# Patient Record
Sex: Female | Born: 1945 | Race: White | Hispanic: No | Smoking: Never smoker
Health system: Southern US, Community
[De-identification: ages and names within clinical notes are randomized; demographics above are authoritative.]

## PROBLEM LIST (undated history)

## (undated) DIAGNOSIS — G43909 Migraine, unspecified, not intractable, without status migrainosus: Secondary | ICD-10-CM

## (undated) DIAGNOSIS — J45990 Exercise induced bronchospasm: Secondary | ICD-10-CM

## (undated) DIAGNOSIS — G9050 Complex regional pain syndrome I, unspecified: Secondary | ICD-10-CM

## (undated) DIAGNOSIS — K219 Gastro-esophageal reflux disease without esophagitis: Secondary | ICD-10-CM

## (undated) DIAGNOSIS — Z8489 Family history of other specified conditions: Secondary | ICD-10-CM

## (undated) DIAGNOSIS — R112 Nausea with vomiting, unspecified: Secondary | ICD-10-CM

## (undated) DIAGNOSIS — I10 Essential (primary) hypertension: Secondary | ICD-10-CM

## (undated) DIAGNOSIS — K5900 Constipation, unspecified: Secondary | ICD-10-CM

## (undated) DIAGNOSIS — M1990 Unspecified osteoarthritis, unspecified site: Secondary | ICD-10-CM

## (undated) DIAGNOSIS — I214 Non-ST elevation (NSTEMI) myocardial infarction: Secondary | ICD-10-CM

## (undated) DIAGNOSIS — E7800 Pure hypercholesterolemia, unspecified: Secondary | ICD-10-CM

## (undated) DIAGNOSIS — J189 Pneumonia, unspecified organism: Secondary | ICD-10-CM

## (undated) DIAGNOSIS — R011 Cardiac murmur, unspecified: Secondary | ICD-10-CM

## (undated) DIAGNOSIS — Z9889 Other specified postprocedural states: Secondary | ICD-10-CM

## (undated) DIAGNOSIS — M726 Necrotizing fasciitis: Secondary | ICD-10-CM

## (undated) DIAGNOSIS — I2510 Atherosclerotic heart disease of native coronary artery without angina pectoris: Secondary | ICD-10-CM

## (undated) HISTORY — PX: TONSILLECTOMY: SUR1361

## (undated) HISTORY — PX: BACK SURGERY: SHX140

## (undated) HISTORY — PX: CORONARY ANGIOPLASTY WITH STENT PLACEMENT: SHX49

---

## 2001-11-28 ENCOUNTER — Encounter: Admission: RE | Admit: 2001-11-28 | Discharge: 2001-11-28 | Payer: Self-pay | Admitting: Internal Medicine

## 2001-11-28 ENCOUNTER — Encounter: Payer: Self-pay | Admitting: Internal Medicine

## 2004-02-04 ENCOUNTER — Ambulatory Visit (HOSPITAL_COMMUNITY): Admission: RE | Admit: 2004-02-04 | Discharge: 2004-02-04 | Payer: Self-pay | Admitting: Internal Medicine

## 2005-02-11 ENCOUNTER — Ambulatory Visit (HOSPITAL_COMMUNITY): Admission: RE | Admit: 2005-02-11 | Discharge: 2005-02-11 | Payer: Self-pay | Admitting: Internal Medicine

## 2005-02-11 ENCOUNTER — Other Ambulatory Visit: Admission: RE | Admit: 2005-02-11 | Discharge: 2005-02-11 | Payer: Self-pay | Admitting: Internal Medicine

## 2006-03-12 ENCOUNTER — Emergency Department (HOSPITAL_COMMUNITY): Admission: EM | Admit: 2006-03-12 | Discharge: 2006-03-12 | Payer: Self-pay | Admitting: Family Medicine

## 2006-07-18 HISTORY — PX: FINGER AMPUTATION: SHX636

## 2006-09-17 ENCOUNTER — Inpatient Hospital Stay (HOSPITAL_COMMUNITY): Admission: EM | Admit: 2006-09-17 | Discharge: 2006-09-20 | Payer: Self-pay | Admitting: Emergency Medicine

## 2006-10-05 ENCOUNTER — Ambulatory Visit (HOSPITAL_BASED_OUTPATIENT_CLINIC_OR_DEPARTMENT_OTHER): Admission: RE | Admit: 2006-10-05 | Discharge: 2006-10-05 | Payer: Self-pay | Admitting: *Deleted

## 2006-10-05 ENCOUNTER — Encounter (INDEPENDENT_AMBULATORY_CARE_PROVIDER_SITE_OTHER): Payer: Self-pay | Admitting: *Deleted

## 2008-02-14 ENCOUNTER — Ambulatory Visit (HOSPITAL_COMMUNITY): Admission: RE | Admit: 2008-02-14 | Discharge: 2008-02-14 | Payer: Self-pay | Admitting: Family Medicine

## 2008-03-19 DIAGNOSIS — M1990 Unspecified osteoarthritis, unspecified site: Secondary | ICD-10-CM | POA: Insufficient documentation

## 2009-06-05 ENCOUNTER — Encounter (INDEPENDENT_AMBULATORY_CARE_PROVIDER_SITE_OTHER): Payer: Self-pay | Admitting: *Deleted

## 2009-07-24 DIAGNOSIS — G44209 Tension-type headache, unspecified, not intractable: Secondary | ICD-10-CM | POA: Insufficient documentation

## 2009-07-24 DIAGNOSIS — I10 Essential (primary) hypertension: Secondary | ICD-10-CM | POA: Insufficient documentation

## 2009-07-24 DIAGNOSIS — K219 Gastro-esophageal reflux disease without esophagitis: Secondary | ICD-10-CM | POA: Insufficient documentation

## 2010-01-07 ENCOUNTER — Encounter (INDEPENDENT_AMBULATORY_CARE_PROVIDER_SITE_OTHER): Payer: Self-pay | Admitting: *Deleted

## 2010-01-13 ENCOUNTER — Encounter (INDEPENDENT_AMBULATORY_CARE_PROVIDER_SITE_OTHER): Payer: Self-pay | Admitting: *Deleted

## 2010-01-14 ENCOUNTER — Ambulatory Visit: Payer: Self-pay | Admitting: Gastroenterology

## 2010-02-15 ENCOUNTER — Telehealth: Payer: Self-pay | Admitting: Gastroenterology

## 2010-02-16 ENCOUNTER — Ambulatory Visit: Payer: Self-pay | Admitting: Gastroenterology

## 2010-08-17 NOTE — Progress Notes (Signed)
Summary: prep ?  Phone Note Call from Patient Call back at [facility] Phone [phone]   Caller: Patient Call For: Dr. Russella Dar Reason for Call: Talk to Nurse Summary of Call: prep ? Initial call taken by: Vallarie Mare,  February 15, 2010 9:27 AM  Follow-up for Phone Call        Pt. needs to start prep 2 hours later due to work. Told pt. this was fine. Follow-up by: Durwin Glaze RN,  February 15, 2010 10:07 AM

## 2010-08-17 NOTE — Procedures (Signed)
Summary: Colonoscopy  Patient: [name] [name] Note: All result statuses are Final unless otherwise noted.  Tests: (1) Colonoscopy (COL)   COL Colonoscopy           DONE     [facility]     [address]     [place], [place]  [address]           COLONOSCOPY PROCEDURE REPORT           PATIENT:  [name], [name]  MR#:  [id number]     BIRTHDATE:  [birth year], 64 yrs. old  GENDER:  female     ENDOSCOPIST:  Judie Petit T. Russella Dar, MD, [facility]     Referred by:  Tracey Harries, M.D.     PROCEDURE DATE:  02/16/2010     PROCEDURE:  Colonoscopy 562[address]     ASA CLASS:  Class II     INDICATIONS:  1) Routine Risk Screening     MEDICATIONS:   Fentanyl 50 mcg IV, Versed 7 mg IV     DESCRIPTION OF PROCEDURE:   After the risks benefits and     alternatives of the procedure were thoroughly explained, informed     consent was obtained.  Digital rectal exam was performed and     revealed no abnormalities.   The LB PCF-H180AL [id number] endoscope     was introduced through the anus and advanced to the cecum, which     was identified by both the appendix and ileocecal valve, without     limitations.  The quality of the prep was excellent, using     MoviPrep.  The instrument was then slowly withdrawn as the colon     was fully examined.     <<PROCEDUREIMAGES>>     FINDINGS:  Mild diverticulosis was found in the sigmoid colon. A     normal appearing cecum, ileocecal valve, and appendiceal orifice     were identified. The ascending, hepatic flexure, transverse,     splenic flexure, descending colon, and rectum appeared     unremarkable. Retroflexed views in the rectum revealed internal     hemorrhoids, small. The time to cecum =  2.25  minutes. The scope     was then withdrawn (time =  9.5  min) from the patient and the     procedure completed.           COMPLICATIONS:  None           ENDOSCOPIC IMPRESSION:     1) Mild diverticulosis in the sigmoid colon     2) Internal hemorrhoids        RECOMMENDATIONS:     1) High fiber diet with liberal fluid intake.     2) Continue current colorectal screening recommendations for     "routine risk" patients with a repeat colonoscopy in 10 years.           Venita Lick. Russella Dar, MD, [facility]           n.     eSIGNED:   Venita Lick. Stark at 02/16/2010 12:11 PM           Georgeann Oppenheim, [id number]  Note: An exclamation mark (!) indicates a result that was not dispersed into the flowsheet. Document Creation Date: 02/16/2010 12:12 PM _______________________________________________________________________  (1) Order result status: Final Collection or observation date-time: 02/16/2010 12:07 Requested date-time:  Receipt date-time:  Reported date-time:  Referring Physician:   Ordering Physician: Claudette Head [phone]) Specimen Source:  Source: Launa Grill  Order Number: [id number] Lab site:   Appended Document: Colonoscopy    Clinical Lists Changes  Observations: Added new observation of COLONNXTDUE: 02/2020 (02/16/2010 13:33)

## 2010-08-17 NOTE — Letter (Signed)
Summary: Moviprep Instructions  [facility]  [address]   [place], [place] [address]   Phone: [phone]  Fax: [phone]       [name] [name]    06/16/1946    MRN: [id number]        Procedure Day /Date: Tuesday, 02-16-10     Arrival Time: 10:30 a.m.      Procedure Time: 11:30 a.m.     Location of Procedure:                    x   [facility] (4th Floor)                        PREPARATION FOR COLONOSCOPY WITH MOVIPREP   Starting 5 days prior to your procedure  02-11-10 do not eat nuts, seeds, popcorn, corn, beans, peas,  salads, or any raw vegetables.  Do not take any fiber supplements (e.g. Metamucil, Citrucel, and Benefiber).  THE DAY BEFORE YOUR PROCEDURE         DATE: 02-15-10   DAY: Monday  1.  Drink clear liquids the entire day-NO SOLID FOOD  2.  Do not drink anything colored red or purple.  Avoid juices with pulp.  No orange juice.  3.  Drink at least 64 oz. (8 glasses) of fluid/clear liquids during the day to prevent dehydration and help the prep work efficiently.  CLEAR LIQUIDS INCLUDE: Water Jello Ice Popsicles Tea (sugar ok, no milk/cream) Powdered fruit flavored drinks Coffee (sugar ok, no milk/cream) Gatorade Juice: apple, white grape, white cranberry  Lemonade Clear bullion, consomm, broth Carbonated beverages (any kind) Strained chicken noodle soup Hard Candy                             4.  In the morning, mix first dose of MoviPrep solution:    Empty 1 Pouch A and 1 Pouch B into the disposable container    Add lukewarm drinking water to the top line of the container. Mix to dissolve    Refrigerate (mixed solution should be used within 24 hrs)  5.  Begin drinking the prep at 5:00 p.m. The MoviPrep container is divided by 4 marks.   Every 15 minutes drink the solution down to the next mark (approximately 8 oz) until the full liter is complete.   6.  Follow completed prep with 16 oz of clear liquid of your choice  (Nothing red or purple).  Continue to drink clear liquids until bedtime.  7.  Before going to bed, mix second dose of MoviPrep solution:    Empty 1 Pouch A and 1 Pouch B into the disposable container    Add lukewarm drinking water to the top line of the container. Mix to dissolve    Refrigerate  THE DAY OF YOUR PROCEDURE      DATE: 02-16-10  DAY: Tuesday  Beginning at 6:30 a.m. (5 hours before procedure):         1. Every 15 minutes, drink the solution down to the next mark (approx 8 oz) until the full liter is complete.  2. Follow completed prep with 16 oz. of clear liquid of your choice.    3. You may drink clear liquids until 9:30 a.m.  (2 HOURS BEFORE PROCEDURE).   MEDICATION INSTRUCTIONS  Unless otherwise instructed, you should take regular prescription medications with a small sip of water   as early  as possible the morning of your procedure.    Additional medication instructions:  Do not take HCTZ day of procedure.         OTHER INSTRUCTIONS  You will need a responsible adult at least 65 years of age to accompany you and drive you home.   This person must remain in the waiting room during your procedure.  Wear loose fitting clothing that is easily removed.  Leave jewelry and other valuables at home.  However, you may wish to bring a book to read or  an iPod/MP3 player to listen to music as you wait for your procedure to start.  Remove all body piercing jewelry and leave at home.  Total time from sign-in until discharge is approximately 2-3 hours.  You should go home directly after your procedure and rest.  You can resume normal activities the  day after your procedure.  The day of your procedure you should not:   Drive   Make legal decisions   Operate machinery   Drink alcohol   Return to work  You will receive specific instructions about eating, activities and medications before you leave.    The above instructions have been reviewed and  explained to me by   Ezra Sites RN  January 14, 2010 5:05 PM     I fully understand and can verbalize these instructions _____________________________ Date _________

## 2010-08-17 NOTE — Letter (Signed)
Summary: Previsit letter  [facility]  [address] [place], [place] [address]   Phone: [phone]  Fax: [phone]       01/07/2010 MRN: [id number]  [name] [name] [address] [place], [place]  [address]  Dear Ms. Califf,  Welcome to the Gastroenterology Division at [facility].    You are scheduled to see a nurse for your pre-procedure visit on 01-14-10 at 4:00p.m. on the 3rd floor at [facility], [address]  We ask that you try to arrive at our office 15 minutes prior to your appointment time to allow for check-in.  Your nurse visit will consist of discussing your medical and surgical history, your immediate family medical history, and your medications.    Please bring a complete list of all your medications or, if you prefer, bring the medication bottles and we will list them.  We will need to be aware of both prescribed and over the counter drugs.  We will need to know exact dosage information as well.  If you are on blood thinners (Coumadin, Plavix, Aggrenox, Ticlid, etc.) please call our office today/prior to your appointment, as we need to consult with your physician about holding your medication.   Please be prepared to read and sign documents such as consent forms, a financial agreement, and acknowledgement forms.  If necessary, and with your consent, a friend or relative is welcome to sit-in on the nurse visit with you.  Please bring your insurance card so that we may make a copy of it.  If your insurance requires a referral to see a specialist, please bring your referral form from your primary care physician.  No co-pay is required for this nurse visit.     If you cannot keep your appointment, please call [phone] to cancel or reschedule prior to your appointment date.  This allows [place] the opportunity to schedule an appointment for another patient in need of care.    Thank you for choosing [facility] for your medical  needs.  We appreciate the opportunity to care for you.  Please visit [place] at our website  to learn more about our practice.                     Sincerely.                                                                                                                   The Gastroenterology Division

## 2010-08-17 NOTE — Miscellaneous (Signed)
Summary: [facility] PV  Clinical Lists Changes  Medications: Added new medication of MOVIPREP 100 GM  SOLR (PEG-KCL-NACL-NASULF-NA ASC-C) As per prep instructions. - Signed Rx of MOVIPREP 100 GM  SOLR (PEG-KCL-NACL-NASULF-NA ASC-C) As per prep instructions.;  #1 x 0;  Signed;  Entered by: Ezra Sites RN;  Authorized by: Meryl Dare MD [facility];  Method used: Electronically to [facility] #[id number]*, [address], [place], [place]  [address], Ph: [phone], Fax: [phone] Observations: Added new observation of ALLERGY REV: Done (01/14/2010 16:19)    Prescriptions: MOVIPREP 100 GM  SOLR (PEG-KCL-NACL-NASULF-NA ASC-C) As per prep instructions.  #1 x 0   Entered by:   Ezra Sites RN   Authorized by:   Meryl Dare MD [facility]   Signed by:   Ezra Sites RN on 01/14/2010   Method used:   Electronically to        [id number] (retail)       [address] [place], [place]  [address]       Ph: [phone]       Fax: [phone]   RxID:   [id number]

## 2010-12-03 NOTE — H&P (Signed)
[name] [name], MEHLMAN NO.:  [id number]   MEDICAL RECORD NO.:  [id number]          PATIENT TYPE:  INP   LOCATION:  1511                         FACILITY:  [facility]   PHYSICIAN:  Lowell Bouton, M.D.DATE OF BIRTH:  [birth year]   DATE OF ADMISSION:  09/17/2006  DATE OF DISCHARGE:                              HISTORY & PHYSICAL   CHIEF COMPLAINT:  Pain and swelling right index finger.   HISTORY OF PRESENT ILLNESS:  The patient is a 65 year old right-handed  female who woke up this morning with pain and swelling in her right  index finger.  It started around the DIP joint and she has a history of  osteoarthritis there.  She did not notice any type of cuts or puncture  wounds.  The distal phalanx then swelled and her finger became red back  to the PIP joint.  She then noticed that her pulp was losing circulation  and that she was getting streaking up her forearm.  She presented to the  emergency around 3:00 a.m.   PAST MEDICAL HISTORY:  Remarkable for wisdom teeth extraction and  childbirth.   CURRENT MEDICATIONS:  Mobic and Actonel.   ALLERGIES:  She is allergic to CODEINE which causes her face to swell  and she becomes short of breath.   FAMILY HISTORY:  Positive for hypertension, negative for diabetes and  heart disease.   SOCIAL HISTORY:  She is married, does not smoke or drink and runs a day  care.   REVIEW OF SYSTEMS:  Positive for osteoporosis, negative for diabetes,  hypertension and heart disease.   PHYSICAL EXAM:  GENERAL:  She is a well-developed, well-nourished female  in obvious distress. Temperature was 103 earlier in the day.  HEENT:  Her pupils are equal, round, and reactive to light.  Nasal  septum is midline.  Oropharynx is clear.  External auditory canals are  clear.  NECK:  No adenopathy or bruit.  CHEST:  Clear.  HEART:  Regular without murmur.  ABDOMEN:  Soft, nontender without masses.  EXTREMITY:  Exam reveals her right upper  extremity to have some  streaking in the forearm and in the upper arm with some tenderness in  the axilla.  The right index finger reveals tenseness of the pulp with  erythema back to the PIP joint and tenderness along the flexor sheath.  There is a dusky appearance to the pulp tissue.  X-rays of the right  index finger show significant osteoarthritis but no sign of infection.   DIAGNOSIS:  Probable felon with extension into the flexor tendon sheath,  right index finger.   PLAN:  Will admit her for IV vancomycin until the morning and then  perform incision and drainage of her right index finger.  She will  require hospitalization for 1 or 2 days following the procedure for IV  antibiotics.      Lowell Bouton, M.D.  Electronically Signed     EMM/MEDQ  D:  09/17/2006  T:  09/17/2006  Job:  [id number]

## 2010-12-03 NOTE — Discharge Summary (Signed)
NAMEAMMA, CREAR NO.:  [id number]   MEDICAL RECORD NO.:  [id number]          PATIENT TYPE:  INP   LOCATION:  1511                         FACILITY:  [facility]   PHYSICIAN:  Lowell Bouton, M.D.DATE OF BIRTH:  June 23, 1946   DATE OF ADMISSION:  09/17/2006  DATE OF DISCHARGE:  09/20/2006                               DISCHARGE SUMMARY   FINAL DIAGNOSIS:  Flexor sheath infection right index finger with  necrosis of tip.   HOSPITAL COURSE:  Brief history:  The patient is a 65 year old female  who noticed pain and swelling early on the day of admission on her right  index finger.  She noticed loss of circulation to the tip and pain  extending into the hand and streaking into her forearm.  She was seen in  the emergency room at 3:00 a.m. on March 2 and was admitted and placed  on IV vancomycin.  She was taken to the operating room the next morning  after spiking to 103.4.  She underwent incision and drainage of the pulp  and flexor tendon sheath of her right index finger.  Postoperatively she  remained febrile to 103 with continued streaking in her forearm.  She  was kept on Ancef and vancomycin postoperatively.  On her second  postoperative day she spiked to 101.8 and was nauseated and lightheaded.  She was treated in the whirlpool and her packings were removed.  Cultures revealed a Streptococcal infection.  She continued to have  decreased circulation in the tip of her finger throughout her hospital  course.  The third postoperative day she became afebrile and was ready  for discharge.  At that time she was instructed on saline soaks twice a  day and was given a prescription for Darvocet and Keflex.  She was  instructed to return to the office in six days.   FINAL DIAGNOSIS:  Infection right index finger.   CONDITION ON DISCHARGE:  Improved.      Lowell Bouton, M.D.  Electronically Signed     EMM/MEDQ  D:  11/27/2006  T:  11/27/2006   Job:  [id number]

## 2010-12-03 NOTE — Op Note (Signed)
NAMEMarland Kitchen  [name], [name] NO.:  [id number]   MEDICAL RECORD NO.:  [id number]          PATIENT TYPE:  AMB   LOCATION:  DSC                          FACILITY:  [facility]   PHYSICIAN:  Tennis Must Meyerdierks, M.D.DATE OF BIRTH:  April 19, 1946   DATE OF PROCEDURE:  10/05/2006  DATE OF DISCHARGE:                               OPERATIVE REPORT   PREOPERATIVE DIAGNOSIS:  Gangrene distal phalanx right index finger.   POSTOPERATIVE DIAGNOSIS:  Gangrene distal phalanx right index finger.   PROCEDURE:  Right index ray resection.   SURGEON:  Lowell Bouton, M.D.   ANESTHESIA:  General.   OPERATIVE FINDINGS:  The patient had necrosis of the entire radial half  of the distal phalanx.  The nerve was involved back to the PIP joint and  there was some viable skin ulnarly.  An amputation was required at least  to the PIP level and the patient opted for ray resection for cosmetic  purposes.   DESCRIPTION OF PROCEDURE:  Under general anesthesia with a tourniquet on  the right arm, the right hand was prepped and draped in the usual  fashion. After elevating the limb, the tourniquet was inflated to 250  mmHg.  The necrotic tissue was debrided and was found to go completely  down to the bone and the flexor tendon with a nerve injury back to the  PIP level.  It was not felt that the distal phalanx with salvageable.  After completely debrided the dead tissue, a ray resection was  undertaken.  This was performed by making a longitudinal incision over  the dorsum of the index metacarpal.  It was then brought around volarly  with flaps on the proximal phalanx to a V in the palm.  Sharp dissection  was carried dorsally down to the second metacarpal and the two extensor  tendons were transected.  The Therapist, nutritional was used to elevate the  interosseous muscles off of the second metacarpal and a saw was used to  divide the proximal end of the metacarpal.  The metacarpal was then  lifted from proximal to distal and sharp dissection was done removing  the musculature off of the metacarpal.  The incision had been carried  around to the palm and blunt dissection was carried down through the  subcutaneous tissues to identify the radial and ulnar neurovascular  bundles.  The arteries were coagulated.  A hemostat was placed on the  end of the digital nerves, both radially and ulnarly, at the PIP level  to allow them to be transposed proximally.  After completely dissecting  out the neurovascular bundles and controlled the bleeding, the  amputation was continued.  The metacarpal was brought from proximal to  distal with a towel clip and sharp dissection was carried down through  the volar plate and the MP joint.  The flexor tendons were then divided  at the level of the MP joint.  The ray was then removed after removing  the subcutaneous tissues radially and ulnarly over the proximal phalanx.  At this point, bleeding was controlled with electrocautery.  The  proximal end  of the second metacarpal was smoothed out with a rongeur  dorsally.  The digital nerves were then addressed and were dissected  back and tension was removed completely.  The ends of the nerve were  trimmed both radially and ulnarly and 4-0 chromic suture was used to  bring the distal ends of both digital nerves back into the area of the  volar plate.  This allowed no tension on the nerve and buried deep so  that there would be no painful neuroma.  The wound was then copiously  irrigated and the fascia of the muscle was closed with 4-0 Vicryl suture  where the ray had been resected.  A vessel loop drain was left in for  drainage.  The skin edges were then trimmed to allow for the best  cosmetic result possible. The skin was closed ulnarly with 4-0 nylon  sutures and dorsally with a 3-0 subcuticular Prolene.  Steri-Strips were  applied.  0.5% Marcaine was inserted in the skin  edges for pain control.  The  patient was placed in sterile dressings and  a volar wrist splint.  She had the tourniquet released with good  circulation of the hand.  She tolerated the procedure well and went to  the recovery room awake in stable and good condition.      Lowell Bouton, M.D.  Electronically Signed     EMM/MEDQ  D:  10/05/2006  T:  10/05/2006  Job:  [id number]

## 2010-12-03 NOTE — Op Note (Signed)
NAMEMarland [name]  HANAE, WAITERS NO.:  [id number]   MEDICAL RECORD NO.:  [id number]          PATIENT TYPE:  INP   LOCATION:  1511                         FACILITY:  [facility]   PHYSICIAN:  Tennis Must Meyerdierks, M.D.DATE OF BIRTH:  May 04, 1946   DATE OF PROCEDURE:  09/17/2006  DATE OF DISCHARGE:                               OPERATIVE REPORT   PREOPERATIVE DIAGNOSIS:  Abscess, right index finger; with extension  into the flexor tendon sheath.   POSTOPERATIVE DIAGNOSIS:  Abscess, right index finger; with extension  into the flexor tendon sheath.   PROCEDURE:  Incision and drainage of flexor tendon sheath, right index  finger; with incision and drainage of pulp and DIP joint.   SURGEON:  Lowell Bouton, M.D.   ANESTHESIA:  General.   OPERATIVE FINDINGS:  The patient had purulent material in the flexor  tendon sheath at the level of the A3 and A4 pulley.  There was also  purulent material in the pulp, but no gross purulence in the DIP joint.  The pulp tissue was partially necrotic.   PROCEDURE:  Under general anesthesia with a tourniquet on the right arm,  the right hand was prepped and draped in the usual fashion.  After  elevating the limb, the tourniquet was inflated to 250 mmHg.  An 11  blade was used to incise the pulp, by making a longitudinal incision  ulnarly over the distal phalanx.  The blade was then brought through the  pulp and the septations were divided with a blade and then with a  hemostat.  No gross purulent material was obtained.  An oblique incision  was then made over the middle phalanx volarly down to the flexor tendon  sheath.  Blunt dissection was carried down to the tendon sheath and  purulent material was obtained.  Cultures were obtained.  Superficial  necrosis was debrided from the distal phalanx.  A transverse incision  was then made at the MP joint of the index finger, overlying the flexor  tendon sheath.  Blunt dissection was  carried down to the tendon sheath  and an 18-gauge angiocath needle was inserted into the flexor tendon  sheath.  The sheath was irrigated with saline copiously.  A transverse  incision was then made over the dorsum of the DIP joint and carried down  through the subcutaneous tissues radial to the extensor tendon.  Sharp  dissection was carried down into the DIP joint, and no gross purulent  material was obtained.  This wound was irrigated and closed with 4-0  nylon sutures.  The wounds were then packed open after irrigating  copiously.  A 0.5% digital block was inserted for pain control.  Sterile  dressings were applied, followed by a dorsal protective splint.  The  tourniquet was released and the finger still had poor circulation to the  pulp.  The patient tolerated the procedure well and went to the recovery  room awake and in stable good condition.      Lowell Bouton, M.D.  Electronically Signed    EMM/MEDQ  D:  09/18/2006  T:  09/18/2006  Job:  [id number]

## 2011-02-11 DIAGNOSIS — J309 Allergic rhinitis, unspecified: Secondary | ICD-10-CM | POA: Insufficient documentation

## 2011-03-14 ENCOUNTER — Ambulatory Visit: Payer: Self-pay | Admitting: Physical Therapy

## 2011-03-28 ENCOUNTER — Ambulatory Visit: Payer: Medicare Other | Attending: Family Medicine | Admitting: Physical Therapy

## 2011-03-28 DIAGNOSIS — M545 Low back pain, unspecified: Secondary | ICD-10-CM | POA: Insufficient documentation

## 2011-03-28 DIAGNOSIS — M2569 Stiffness of other specified joint, not elsewhere classified: Secondary | ICD-10-CM | POA: Insufficient documentation

## 2011-03-28 DIAGNOSIS — IMO0001 Reserved for inherently not codable concepts without codable children: Secondary | ICD-10-CM | POA: Insufficient documentation

## 2011-04-04 ENCOUNTER — Ambulatory Visit: Payer: Medicare Other | Admitting: Physical Therapy

## 2011-04-07 ENCOUNTER — Ambulatory Visit: Payer: Medicare Other | Admitting: Physical Therapy

## 2011-04-12 ENCOUNTER — Ambulatory Visit: Payer: Medicare Other | Admitting: Physical Therapy

## 2011-04-27 ENCOUNTER — Ambulatory Visit: Payer: Medicare Other | Attending: Family Medicine | Admitting: Physical Therapy

## 2011-04-27 DIAGNOSIS — M2569 Stiffness of other specified joint, not elsewhere classified: Secondary | ICD-10-CM | POA: Insufficient documentation

## 2011-04-27 DIAGNOSIS — M545 Low back pain, unspecified: Secondary | ICD-10-CM | POA: Insufficient documentation

## 2011-04-27 DIAGNOSIS — IMO0001 Reserved for inherently not codable concepts without codable children: Secondary | ICD-10-CM | POA: Insufficient documentation

## 2011-10-31 ENCOUNTER — Other Ambulatory Visit: Payer: Self-pay | Admitting: Family Medicine

## 2011-10-31 DIAGNOSIS — M549 Dorsalgia, unspecified: Secondary | ICD-10-CM

## 2011-11-02 ENCOUNTER — Ambulatory Visit
Admission: RE | Admit: 2011-11-02 | Discharge: 2011-11-02 | Disposition: A | Discharge status: Home / Self Care | Payer: Medicare Other | Source: Ambulatory Visit | Attending: Family Medicine | Admitting: Family Medicine

## 2011-11-02 DIAGNOSIS — M549 Dorsalgia, unspecified: Secondary | ICD-10-CM

## 2011-11-04 ENCOUNTER — Ambulatory Visit
Admission: RE | Admit: 2011-11-04 | Discharge: 2011-11-04 | Disposition: A | Discharge status: Home / Self Care | Payer: Medicare Other | Source: Ambulatory Visit | Attending: Family Medicine | Admitting: Family Medicine

## 2011-11-04 DIAGNOSIS — M549 Dorsalgia, unspecified: Secondary | ICD-10-CM

## 2011-11-07 ENCOUNTER — Other Ambulatory Visit: Payer: Medicare Other

## 2011-11-09 ENCOUNTER — Other Ambulatory Visit: Payer: Medicare Other

## 2012-08-20 ENCOUNTER — Other Ambulatory Visit: Payer: Self-pay | Admitting: Neurosurgery

## 2012-09-10 ENCOUNTER — Encounter (HOSPITAL_COMMUNITY): Payer: Self-pay | Admitting: Pharmacy Technician

## 2012-09-14 ENCOUNTER — Encounter (HOSPITAL_COMMUNITY): Payer: Self-pay

## 2012-09-14 ENCOUNTER — Encounter (HOSPITAL_COMMUNITY)
Admission: RE | Admit: 2012-09-14 | Discharge: 2012-09-14 | Disposition: A | Discharge status: Home / Self Care | Payer: Medicare Other | Source: Ambulatory Visit | Attending: Neurosurgery | Admitting: Neurosurgery

## 2012-09-14 ENCOUNTER — Encounter (HOSPITAL_COMMUNITY)
Admission: RE | Admit: 2012-09-14 | Discharge: 2012-09-14 | Disposition: A | Discharge status: Home / Self Care | Payer: Medicare Other | Source: Ambulatory Visit | Attending: Anesthesiology | Admitting: Anesthesiology

## 2012-09-14 HISTORY — DX: Nausea with vomiting, unspecified: R11.2

## 2012-09-14 HISTORY — DX: Nausea with vomiting, unspecified: Z98.890

## 2012-09-14 HISTORY — DX: Necrotizing fasciitis: M72.6

## 2012-09-14 HISTORY — DX: Gastro-esophageal reflux disease without esophagitis: K21.9

## 2012-09-14 HISTORY — DX: Complex regional pain syndrome I, unspecified: G90.50

## 2012-09-14 HISTORY — DX: Unspecified osteoarthritis, unspecified site: M19.90

## 2012-09-14 HISTORY — DX: Essential (primary) hypertension: I10

## 2012-09-14 HISTORY — DX: Constipation, unspecified: K59.00

## 2012-09-14 LAB — CBC
HCT: 38.5 % (ref 36.0–46.0)
MCV: 82.8 fL (ref 78.0–100.0)
Platelets: 309 10*3/uL (ref 150–400)
RBC: 4.65 MIL/uL (ref 3.87–5.11)
RDW: 13.2 % (ref 11.5–15.5)
WBC: 6.6 10*3/uL (ref 4.0–10.5)

## 2012-09-14 LAB — BASIC METABOLIC PANEL
BUN: 10 mg/dL (ref 6–23)
Chloride: 99 mEq/L (ref 96–112)
Creatinine, Ser: 0.68 mg/dL (ref 0.50–1.10)
GFR calc Af Amer: >90 mL/min (ref >90)
Glucose, Bld: 88 mg/dL (ref 70–99)

## 2012-09-14 LAB — ABO/RH: ABO/RH(D): O POS

## 2012-09-14 LAB — SURGICAL PCR SCREEN: MRSA, PCR: NEGATIVE

## 2012-09-14 NOTE — Pre-Procedure Instructions (Addendum)
[name] [name]  09/14/2012   Your procedure is scheduled on:  Thursday, March 6th.  Report to [facility] at 5:30 AM.  Call this number if you have problems the morning of surgery: [phone]   Remember:   Do not eat food or drink liquids after midnight.    Take these medicines the morning of surgery with A SIP OF WATER: Loratadine (Clartin), Metoprolol (Toprolol). Omeprazole (Prilosec).  May take Hydrocodone - Acetaminophen if needed.  Stop taking Aspirin, Coumadin, Plavix, Effient and Herbal medications.  Do not take any NSAIDs ie: Ibuprofen,  Advil,Naproxen, Meloxicam (Mobic). or any medication containing Aspirin.    Do not wear jewelry, make-up or nail polish.  Do not wear lotions, powders, or perfumes. You may wear deodorant.  Do not shave 48 hours prior to surgery.  Do not bring valuables to the hospital.  Contacts, dentures or bridgework may not be worn into surgery.  Leave suitcase in the car. After surgery it may be brought to your room.  For patients admitted to the hospital, checkout time is 11:00 AM the day of discharge.    Special Instructions: Shower using CHG 2 nights before surgery and the night before surgery.  If you shower the day of surgery use CHG.  Use special wash - you have one bottle of CHG for all showers.  You should use approximately 1/3 of the bottle for each shower.   Please read over the following fact sheets that you were given: Pain Booklet, Coughing and Deep Breathing, Blood Transfusion Information and Surgical Site Infection Prevention

## 2012-09-19 MED ORDER — CEFAZOLIN SODIUM-DEXTROSE 2-3 GM-% IV SOLR
2.0000 g | INTRAVENOUS | Status: AC
  Administered 2012-09-20: 2 g via INTRAVENOUS
  Filled 2012-09-19: qty 50

## 2012-09-19 NOTE — H&P (Signed)
[name] [name]  #[id number] DOB:  1945/08/09   08/15/2012:     [name] [name] comes in today to review her continued pain complaints and lack of progress with conservative management including aquatic therapy.  She said she has had increasing pain and is not able to do anything for herself and feels she has made no improvement at all.  She says she is having to take more Hydrocodone 5/325 up to 2 per day.  When we last spoke, I explained to [name] that we want her to try to get some improvement with conservative management including aquatic therapy and physical therapy and hold off on pursuing surgery.  She says at this point she does not feel that she is able to do that and is not doing terribly well. She said she does want to proceed with surgery.  This would consist of fixation of her lumbar scoliosis with anterolateral decompression and fusion from a right-sided approach, L3-4, L4-5 with posterior lumbar interbody fusion at L5-S1 with percutaneous pedicle screw fixation L1-S1 levels.    We fitted her with a TSLO brace.  We showed her models of her spine and answered her questions as to the surgical options.  She has significant coronal instability, a 30 degree curvature, and marked spondylosis with spondylolisthesis.  She has intractable back and leg pain and has not had any improvement with conservative management including extended physical therapy and injections.  She is taking anti-inflammatories and apparently does use Meloxicam and is also using Hydrocodone on a more frequent basis.  Risks and benefits are discussed in detail and she does wish to proceed. I went over the surgical procedure with the patient in great detail today.  I went over the diagnostic studies, as well as surgical models.  We discussed the potential risks and benefits of the surgery, as well as expected hospital course.  The patient would generally wear a brace for three months after surgery.  The risks include, but are not  limited to, bleeding, possible need for transfusion, infection, damage to nerves and vessels, risks of anesthesia, dural tear, injury to lumbar nerve roots causing either temporary or permanent leg pain, numbness or weakness, malpositioning of instrumentation, fusion failure, failure to relieve pain, back pain after surgery, recurrent disc herniation, worsening of pain and adjacent degeneration after a spinal fusion.  Also, the potential for the need for further surgery in the event of incomplete fusion.  We also discussed the risks of injury to abdominal structures including bowel, iliac artery or vein injury.           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  She describes left leg pain radiating to her ankle.  She feels that her low back is worse than her leg, but her leg is quite bothersome.  She notes numbness into her left leg and to her knee.  She says this began in the 1990's and has been worse lately.  She has increased pain with standing or walking.  She says she got no help with injections and physical therapy gave her no significant relief.  She has been taking Mobic 15 mg once daily for years and also  takes Hydrocodone 5/325 4 to 5 per week.  She engaged in physical therapy in 2012 and it did not give her any relief.  She had epidural steroid injections in June and July of this year by Dr. Ethelene Hal without any significant relief.  She complains of severe pain with standing.  She does say that she gets relief with sitting and also with walking, but is only able to stand a maximum of 5 minutes and has to lean forward to get some relief.    [name] [name] states that she has had regular bone density testing and says that she did have  osteoporosis, but has been told that this is all improved and that she is better from that standpoint.    REVIEW OF SYSTEMS:   A detailed Review of Systems sheet was reviewed with the patient.  Pertinent positives include Eyes - she wears glasses; Cardiovascular - she notes high blood pressure, high cholesterol, leg pain with walking; Gastrointestinal - she notes indigestion or pain with eating; Musculoskeletal - she notes back pain, leg pain, joint pain and arthritis; Allergic/Immunologic - she notes allergies to garlic.    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      Medications and Allergies:  Medications include Meloxicam 15 mg qd, Metoprolol 25 mg qd, Chlorthalidone 25 mg qd, Hydrocodone 325 mg, Nortriptyline 25 mg tid, Loratadine 10 mg qd, Prilosec 20 mg qd.  SHE IS ALLERGIC TO CODEINE WHICH SHE SAYS CAUSES SWELLING IN HER AIRWAY.      Height and Weight:  She is currently 5', 5 " tall and 165 lbs.  BMI is 26.5.    SOCIAL HISTORY:    She denies tobacco, alcohol or drug use.    DIAGNOSTIC STUDIES:   Radiographs obtained in the office today show significant levoconvex scoliosis through the lumbar spine most marked at the L4-5 level.  She has coronal malalignment without significant spondylolisthesis.    Her newer imaging studies demonstrate her last MRI performed in April which showed degenerative disc disease, in addition to her scoliosis with moderate stenosis at multiple levels.    I have reviewed previous records from Dr. Newell Coral who saw her in 2003 and at that time felt that she had low back pain and discomfort extending into her lower extremities.  She was neurologically intact with moderate degenerative changes in the lumbar spine and he recommended nonsteroidal  anti-inflammatories, possibly epidural injections and prn followup.    PHYSICAL EXAMINATION:      General Appearance:  [name] [name] is a pleasant, cooperative woman in no acute distress.       Blood Pressure, Pulse and Respiratory Rate:  Her blood pressure is 132/76.  Heart rate is 76 and regular.  Respiratory rate is 18.           HEENT - normocephalic, atraumatic.  The pupils are equal, round and reactive to light.  The extraocular muscles are intact.  Sclerae - white.  Conjunctiva - pink.  Oropharynx benign.  Uvula midline.     Neck - there are no masses, meningismus, deformities, tracheal deviation, jugular vein distention or carotid bruits.  There is normal cervical range of motion.  Spurlings' test is negative without reproducible radicular pain turning the patient's head to either side.  Lhermitte's sign is not present with  axial compression.      Respiratory - there is normal respiratory effort with good intercostal function.  Lungs are clear to auscultation.  There are no rales, rhonchi or wheezes.      Cardiovascular - the heart has regular rate and rhythm to auscultation.  No murmurs are appreciated.  There is no extremity edema, cyanosis or clubbing.  There are palpable pedal pulses.      Abdomen - soft, nontender, no hepatosplenomegaly appreciated or masses.  There are active bowel sounds.  No guarding or rebound.      Musculoskeletal Examination - she has left sciatic notch discomfort with minimal midline low back pain.  She is able to bend to the level of her knees with her upper extremities outstretched.  She is able to stand on her heels and toes.  She has a positive straight leg raise at 30 degrees on the left.  Negative Patrick's test.    NEUROLOGICAL EXAMINATION: The patient is oriented to time, person and place and has good recall of both recent and remote memory with normal attention span and concentration.  The patient speaks with clear and fluent speech and exhibits normal  language function and appropriate fund of knowledge.      Cranial Nerve Examination - pupils are equal, round and reactive to light.  Extraocular movements are full.  Visual fields are full to confrontational testing.  Facial sensation and facial movement are symmetric and intact.  Hearing is intact to finger rub.  Palate is upgoing.  Shoulder shrug is symmetric.  Tongue protrudes in the midline.      Motor Examination - motor strength is 5/5 in the bilateral deltoids, biceps, triceps, handgrips, wrist extensors, interosseous.  In the lower extremities motor strength is 5/5 with the exception of left dorsiflexion at 4/5 and left hip abductor at 4/5.      Sensory Examination - normal to light touch and pinprick sensation in the upper and lower extremities.     Deep Tendon Reflexes - 2 in the biceps, triceps, and brachioradialis, 2 in the knees, 2 in the ankles.  The great toes are downgoing to plantar stimulation.  No pathologic reflexes.       Cerebellar Examination - normal coordination in upper and lower extremities and normal rapid alternating movements.  Romberg test is negative.    IMPRESSION AND RECOMMENDATIONS:   [name] [name] is a 67 year old woman with multilevel levoconvex lumbar scoliosis.  I have recommended that she get scoliosis radiographs in the AP and lateral projection and I explained to her that her problem is treatable from a surgical standpoint, but with the significant scoliosis this would not be a small surgical endeavor, but would require decompression and fusion over multiple levels.  She is not sure whether she wants to undergo that and I invited her to come back with her husband to discuss the situation further to review her bone density and her imaging studies and talk about further treatment options.  She will call to schedule that.  I will see her back on an as needed basis.     [facility]    [name] [name], M.D.

## 2012-09-20 ENCOUNTER — Ambulatory Visit (HOSPITAL_COMMUNITY): Payer: Medicare Other

## 2012-09-20 ENCOUNTER — Encounter (HOSPITAL_COMMUNITY)
Admission: RE | Disposition: A | Discharge status: Home / Self Care | Payer: Self-pay | Source: Ambulatory Visit | Attending: Neurosurgery

## 2012-09-20 ENCOUNTER — Ambulatory Visit (HOSPITAL_COMMUNITY): Payer: Medicare Other | Admitting: Anesthesiology

## 2012-09-20 ENCOUNTER — Inpatient Hospital Stay (HOSPITAL_COMMUNITY)
Admission: RE | Admit: 2012-09-20 | Discharge: 2012-09-27 | DRG: 454 | Disposition: A | Discharge status: Home Health | Payer: Medicare Other | Source: Ambulatory Visit | Attending: Neurosurgery | Admitting: Neurosurgery

## 2012-09-20 ENCOUNTER — Encounter (HOSPITAL_COMMUNITY): Payer: Self-pay | Admitting: *Deleted

## 2012-09-20 ENCOUNTER — Encounter (HOSPITAL_COMMUNITY): Payer: Self-pay | Admitting: Anesthesiology

## 2012-09-20 DIAGNOSIS — Q762 Congenital spondylolisthesis: Secondary | ICD-10-CM

## 2012-09-20 DIAGNOSIS — Z01810 Encounter for preprocedural cardiovascular examination: Secondary | ICD-10-CM

## 2012-09-20 DIAGNOSIS — I10 Essential (primary) hypertension: Secondary | ICD-10-CM | POA: Diagnosis present

## 2012-09-20 DIAGNOSIS — M419 Scoliosis, unspecified: Secondary | ICD-10-CM

## 2012-09-20 DIAGNOSIS — K219 Gastro-esophageal reflux disease without esophagitis: Secondary | ICD-10-CM | POA: Diagnosis present

## 2012-09-20 DIAGNOSIS — Z01812 Encounter for preprocedural laboratory examination: Secondary | ICD-10-CM

## 2012-09-20 DIAGNOSIS — Z01818 Encounter for other preprocedural examination: Secondary | ICD-10-CM

## 2012-09-20 DIAGNOSIS — D62 Acute posthemorrhagic anemia: Secondary | ICD-10-CM | POA: Diagnosis not present

## 2012-09-20 DIAGNOSIS — J45909 Unspecified asthma, uncomplicated: Secondary | ICD-10-CM | POA: Diagnosis present

## 2012-09-20 DIAGNOSIS — E876 Hypokalemia: Secondary | ICD-10-CM | POA: Diagnosis not present

## 2012-09-20 DIAGNOSIS — M4180 Other forms of scoliosis, site unspecified: Principal | ICD-10-CM | POA: Diagnosis present

## 2012-09-20 DIAGNOSIS — M1990 Unspecified osteoarthritis, unspecified site: Secondary | ICD-10-CM | POA: Diagnosis present

## 2012-09-20 DIAGNOSIS — M48061 Spinal stenosis, lumbar region without neurogenic claudication: Secondary | ICD-10-CM | POA: Diagnosis present

## 2012-09-20 HISTORY — PX: ANTERIOR LATERAL LUMBAR FUSION 4 LEVELS: SHX5552

## 2012-09-20 LAB — CBC
Hemoglobin: 9.9 g/dL — ABNORMAL LOW (ref 12.0–15.0)
RBC: 3.37 MIL/uL — ABNORMAL LOW (ref 3.87–5.11)

## 2012-09-20 LAB — PREPARE RBC (CROSSMATCH)

## 2012-09-20 LAB — POCT I-STAT 4, (NA,K, GLUC, HGB,HCT)
Potassium: 2.4 mEq/L — CL (ref 3.5–5.1)
Sodium: 136 mEq/L (ref 135–145)

## 2012-09-20 SURGERY — ANTERIOR LATERAL LUMBAR FUSION 4 LEVELS
Anesthesia: General | Site: Back | Laterality: Right | Wound class: Clean

## 2012-09-20 MED ORDER — HYDROCODONE-ACETAMINOPHEN 5-325 MG PO TABS: 1.0000 | ORAL_TABLET | Freq: Four times a day (QID) | ORAL | Status: DC | PRN

## 2012-09-20 MED ORDER — EPHEDRINE SULFATE 50 MG/ML IJ SOLN
INTRAMUSCULAR | Status: DC | PRN
  Administered 2012-09-20 (×2): 5 mg via INTRAVENOUS
  Administered 2012-09-20 (×2): 10 mg via INTRAVENOUS
  Administered 2012-09-20: 5 mg via INTRAVENOUS
  Administered 2012-09-20: 10 mg via INTRAVENOUS

## 2012-09-20 MED ORDER — VECURONIUM BROMIDE 10 MG IV SOLR
INTRAVENOUS | Status: DC | PRN
  Administered 2012-09-20 (×2): 2 mg via INTRAVENOUS
  Administered 2012-09-20: 4 mg via INTRAVENOUS

## 2012-09-20 MED ORDER — ONDANSETRON HCL 4 MG/2ML IJ SOLN
4.0000 mg | INTRAMUSCULAR | Status: DC | PRN
  Administered 2012-09-22: 4 mg via INTRAVENOUS
  Filled 2012-09-20: qty 2

## 2012-09-20 MED ORDER — ACETAMINOPHEN 10 MG/ML IV SOLN
1000.0000 mg | Freq: Once | INTRAVENOUS | Status: AC
  Administered 2012-09-20: 1000 mg via INTRAVENOUS
  Filled 2012-09-20: qty 100

## 2012-09-20 MED ORDER — ACETAMINOPHEN 325 MG PO TABS: 650.0000 mg | ORAL_TABLET | ORAL | Status: DC | PRN

## 2012-09-20 MED ORDER — BISACODYL 5 MG PO TBEC
5.0000 mg | DELAYED_RELEASE_TABLET | Freq: Every day | ORAL | Status: DC
  Administered 2012-09-20 – 2012-09-25 (×5): 5 mg via ORAL
  Filled 2012-09-20 (×7): qty 1

## 2012-09-20 MED ORDER — GLYCOPYRROLATE 0.2 MG/ML IJ SOLN
INTRAMUSCULAR | Status: DC | PRN
  Administered 2012-09-20: 0.2 mg via INTRAVENOUS
  Administered 2012-09-20: 0.6 mg via INTRAVENOUS

## 2012-09-20 MED ORDER — TOLNAFTATE 1 % EX CREA
1.0000 "application " | TOPICAL_CREAM | Freq: Two times a day (BID) | CUTANEOUS | Status: DC
  Filled 2012-09-20: qty 30

## 2012-09-20 MED ORDER — NORTRIPTYLINE HCL 25 MG PO CAPS
75.0000 mg | ORAL_CAPSULE | Freq: Every day | ORAL | Status: DC
  Administered 2012-09-20 – 2012-09-23 (×4): 25 mg via ORAL
  Administered 2012-09-24 – 2012-09-25 (×2): 75 mg via ORAL
  Administered 2012-09-26: 25 mg via ORAL
  Filled 2012-09-20 (×8): qty 3

## 2012-09-20 MED ORDER — POTASSIUM CHLORIDE 10 MEQ/100ML IV SOLN
INTRAVENOUS | Status: AC
  Administered 2012-09-20 – 2012-09-21 (×2): 10 meq
  Filled 2012-09-20: qty 300

## 2012-09-20 MED ORDER — FENTANYL CITRATE 0.05 MG/ML IJ SOLN
INTRAMUSCULAR | Status: DC | PRN
  Administered 2012-09-20 (×4): 50 ug via INTRAVENOUS
  Administered 2012-09-20: 350 ug via INTRAVENOUS
  Administered 2012-09-20 (×4): 50 ug via INTRAVENOUS

## 2012-09-20 MED ORDER — PHENOL 1.4 % MT LIQD: 1.0000 | OROMUCOSAL | Status: DC | PRN

## 2012-09-20 MED ORDER — PROMETHAZINE HCL 25 MG/ML IJ SOLN: 6.2500 mg | INTRAMUSCULAR | Status: DC | PRN

## 2012-09-20 MED ORDER — SENNOSIDES-DOCUSATE SODIUM 8.6-50 MG PO TABS
1.0000 | ORAL_TABLET | Freq: Every evening | ORAL | Status: DC | PRN
  Administered 2012-09-21: 1 via ORAL
  Filled 2012-09-20: qty 1

## 2012-09-20 MED ORDER — SODIUM CHLORIDE 0.9 % IV SOLN
10.0000 mg | INTRAVENOUS | Status: DC | PRN
  Administered 2012-09-20: 10 ug/min via INTRAVENOUS

## 2012-09-20 MED ORDER — LACTATED RINGERS IV SOLN
INTRAVENOUS | Status: DC | PRN
  Administered 2012-09-20 (×2): via INTRAVENOUS

## 2012-09-20 MED ORDER — SCOPOLAMINE 1 MG/3DAYS TD PT72: 1.0000 | MEDICATED_PATCH | TRANSDERMAL | Status: DC

## 2012-09-20 MED ORDER — HYDROMORPHONE HCL PF 1 MG/ML IJ SOLN
INTRAMUSCULAR | Status: AC
  Filled 2012-09-20: qty 1

## 2012-09-20 MED ORDER — POTASSIUM CHLORIDE 10 MEQ/100ML IV SOLN: 10.0000 meq | INTRAVENOUS | Status: DC

## 2012-09-20 MED ORDER — OXYCODONE HCL 5 MG/5ML PO SOLN: 5.0000 mg | Freq: Once | ORAL | Status: DC | PRN

## 2012-09-20 MED ORDER — HYDROCODONE-ACETAMINOPHEN 5-325 MG PO TABS
1.0000 | ORAL_TABLET | ORAL | Status: DC | PRN
  Administered 2012-09-21 (×3): 1 via ORAL
  Administered 2012-09-22: 2 via ORAL
  Filled 2012-09-20: qty 2
  Filled 2012-09-20 (×3): qty 1

## 2012-09-20 MED ORDER — CEFAZOLIN SODIUM 1-5 GM-% IV SOLN
1.0000 g | Freq: Three times a day (TID) | INTRAVENOUS | Status: AC
  Administered 2012-09-20 – 2012-09-21 (×2): 1 g via INTRAVENOUS
  Filled 2012-09-20 (×2): qty 50

## 2012-09-20 MED ORDER — MEPERIDINE HCL 25 MG/ML IJ SOLN: 6.2500 mg | INTRAMUSCULAR | Status: DC | PRN

## 2012-09-20 MED ORDER — SODIUM CHLORIDE 0.9 % IV SOLN: 250.0000 mL | INTRAVENOUS | Status: DC

## 2012-09-20 MED ORDER — BISACODYL 10 MG RE SUPP: 10.0000 mg | Freq: Every day | RECTAL | Status: DC | PRN

## 2012-09-20 MED ORDER — DIAZEPAM 5 MG PO TABS
5.0000 mg | ORAL_TABLET | Freq: Four times a day (QID) | ORAL | Status: DC | PRN
  Administered 2012-09-22 – 2012-09-25 (×5): 5 mg via ORAL
  Filled 2012-09-20 (×6): qty 1

## 2012-09-20 MED ORDER — PANTOPRAZOLE SODIUM 40 MG PO TBEC
40.0000 mg | DELAYED_RELEASE_TABLET | Freq: Every day | ORAL | Status: DC
  Administered 2012-09-21 – 2012-09-27 (×8): 40 mg via ORAL
  Filled 2012-09-20 (×7): qty 1

## 2012-09-20 MED ORDER — 0.9 % SODIUM CHLORIDE (POUR BTL) OPTIME
TOPICAL | Status: DC | PRN
  Administered 2012-09-20: 1000 mL

## 2012-09-20 MED ORDER — PROPOFOL 10 MG/ML IV BOLUS
INTRAVENOUS | Status: DC | PRN
  Administered 2012-09-20: 200 mg via INTRAVENOUS

## 2012-09-20 MED ORDER — ACETAMINOPHEN 650 MG RE SUPP: 650.0000 mg | RECTAL | Status: DC | PRN

## 2012-09-20 MED ORDER — LACTATED RINGERS IV SOLN
INTRAVENOUS | Status: DC | PRN
  Administered 2012-09-20 (×3): via INTRAVENOUS

## 2012-09-20 MED ORDER — ALBUMIN HUMAN 5 % IV SOLN
INTRAVENOUS | Status: DC | PRN
  Administered 2012-09-20 (×2): via INTRAVENOUS

## 2012-09-20 MED ORDER — HYDROMORPHONE HCL PF 1 MG/ML IJ SOLN
0.2500 mg | INTRAMUSCULAR | Status: DC | PRN
  Administered 2012-09-20 (×3): 0.5 mg via INTRAVENOUS

## 2012-09-20 MED ORDER — NEOSTIGMINE METHYLSULFATE 1 MG/ML IJ SOLN
INTRAMUSCULAR | Status: DC | PRN
  Administered 2012-09-20: 4 mg via INTRAVENOUS

## 2012-09-20 MED ORDER — LORATADINE 10 MG PO TABS
10.0000 mg | ORAL_TABLET | Freq: Every day | ORAL | Status: DC
  Administered 2012-09-20 – 2012-09-27 (×8): 10 mg via ORAL
  Filled 2012-09-20 (×8): qty 1

## 2012-09-20 MED ORDER — ZOLPIDEM TARTRATE 5 MG PO TABS: 5.0000 mg | ORAL_TABLET | Freq: Every evening | ORAL | Status: DC | PRN

## 2012-09-20 MED ORDER — PHENYLEPHRINE HCL 10 MG/ML IJ SOLN
INTRAMUSCULAR | Status: DC | PRN
  Administered 2012-09-20: 40 ug via INTRAVENOUS
  Administered 2012-09-20: 80 ug via INTRAVENOUS
  Administered 2012-09-20 (×4): 40 ug via INTRAVENOUS
  Administered 2012-09-20: 80 ug via INTRAVENOUS

## 2012-09-20 MED ORDER — ONDANSETRON HCL 4 MG/2ML IJ SOLN
INTRAMUSCULAR | Status: DC | PRN
  Administered 2012-09-20: 4 mg via INTRAVENOUS

## 2012-09-20 MED ORDER — SCOPOLAMINE 1 MG/3DAYS TD PT72
MEDICATED_PATCH | TRANSDERMAL | Status: AC
  Filled 2012-09-20: qty 1

## 2012-09-20 MED ORDER — SCOPOLAMINE 1 MG/3DAYS TD PT72
MEDICATED_PATCH | TRANSDERMAL | Status: DC | PRN
  Administered 2012-09-20: 1 via TRANSDERMAL

## 2012-09-20 MED ORDER — SODIUM CHLORIDE 0.9 % IV BOLUS (SEPSIS)
500.0000 mL | Freq: Once | INTRAVENOUS | Status: AC
  Administered 2012-09-20: 500 mL via INTRAVENOUS

## 2012-09-20 MED ORDER — HEMOSTATIC AGENTS (NO CHARGE) OPTIME
TOPICAL | Status: DC | PRN
  Administered 2012-09-20: 1 via TOPICAL

## 2012-09-20 MED ORDER — SODIUM CHLORIDE 0.9 % IJ SOLN: 3.0000 mL | INTRAMUSCULAR | Status: DC | PRN

## 2012-09-20 MED ORDER — DOCUSATE SODIUM 100 MG PO CAPS
100.0000 mg | ORAL_CAPSULE | Freq: Two times a day (BID) | ORAL | Status: DC
  Administered 2012-09-20 – 2012-09-25 (×10): 100 mg via ORAL
  Filled 2012-09-20 (×12): qty 1

## 2012-09-20 MED ORDER — MIDAZOLAM HCL 2 MG/2ML IJ SOLN: 0.5000 mg | Freq: Once | INTRAMUSCULAR | Status: DC | PRN

## 2012-09-20 MED ORDER — OXYCODONE HCL 5 MG PO TABS: 5.0000 mg | ORAL_TABLET | Freq: Once | ORAL | Status: DC | PRN

## 2012-09-20 MED ORDER — CLOTRIMAZOLE 1 % EX CREA
TOPICAL_CREAM | Freq: Two times a day (BID) | CUTANEOUS | Status: DC
  Administered 2012-09-20: 22:00:00 via TOPICAL
  Administered 2012-09-21: 1 via TOPICAL
  Administered 2012-09-21: 2 via TOPICAL
  Administered 2012-09-22 – 2012-09-27 (×9): via TOPICAL
  Filled 2012-09-20 (×2): qty 15

## 2012-09-20 MED ORDER — SODIUM CHLORIDE 0.9 % IJ SOLN
3.0000 mL | Freq: Two times a day (BID) | INTRAMUSCULAR | Status: DC
  Administered 2012-09-20 – 2012-09-27 (×10): 3 mL via INTRAVENOUS

## 2012-09-20 MED ORDER — DEXAMETHASONE SODIUM PHOSPHATE 4 MG/ML IJ SOLN
INTRAMUSCULAR | Status: DC | PRN
  Administered 2012-09-20: 8 mg via INTRAVENOUS

## 2012-09-20 MED ORDER — ARTIFICIAL TEARS OP OINT
TOPICAL_OINTMENT | OPHTHALMIC | Status: DC | PRN
  Administered 2012-09-20: 1 via OPHTHALMIC

## 2012-09-20 MED ORDER — OXYCODONE-ACETAMINOPHEN 5-325 MG PO TABS
1.0000 | ORAL_TABLET | ORAL | Status: DC | PRN
Start: 2012-09-20 — End: 2012-09-27
  Administered 2012-09-21 – 2012-09-22 (×4): 1 via ORAL
  Administered 2012-09-25 – 2012-09-26 (×2): 2 via ORAL
  Filled 2012-09-20: qty 2
  Filled 2012-09-20 (×4): qty 1
  Filled 2012-09-20: qty 2
  Filled 2012-09-20: qty 1

## 2012-09-20 MED ORDER — MIDAZOLAM HCL 5 MG/5ML IJ SOLN
INTRAMUSCULAR | Status: DC | PRN
  Administered 2012-09-20: 2 mg via INTRAVENOUS

## 2012-09-20 MED ORDER — THROMBIN 5000 UNITS EX SOLR
CUTANEOUS | Status: DC | PRN
  Administered 2012-09-20 (×2): 5000 [IU] via TOPICAL

## 2012-09-20 MED ORDER — FLEET ENEMA 7-19 GM/118ML RE ENEM
1.0000 | ENEMA | Freq: Once | RECTAL | Status: AC | PRN
  Filled 2012-09-20: qty 1

## 2012-09-20 MED ORDER — SUCCINYLCHOLINE CHLORIDE 20 MG/ML IJ SOLN
INTRAMUSCULAR | Status: DC | PRN
  Administered 2012-09-20: 100 mg via INTRAVENOUS

## 2012-09-20 MED ORDER — CHLORTHALIDONE 25 MG PO TABS
25.0000 mg | ORAL_TABLET | Freq: Every day | ORAL | Status: DC
  Administered 2012-09-23: 25 mg via ORAL
  Filled 2012-09-20 (×7): qty 1

## 2012-09-20 MED ORDER — CEFAZOLIN SODIUM 1-5 GM-% IV SOLN
INTRAVENOUS | Status: AC
  Administered 2012-09-20: 1 g via INTRAVENOUS
  Filled 2012-09-20: qty 50

## 2012-09-20 MED ORDER — LIDOCAINE HCL (CARDIAC) 20 MG/ML IV SOLN
INTRAVENOUS | Status: DC | PRN
  Administered 2012-09-20: 15 mg via INTRAVENOUS

## 2012-09-20 MED ORDER — PHENYLEPHRINE HCL 10 MG/ML IJ SOLN: 10.0000 mg | INTRAVENOUS | Status: DC | PRN

## 2012-09-20 MED ORDER — BUPIVACAINE HCL (PF) 0.5 % IJ SOLN
INTRAMUSCULAR | Status: DC | PRN
  Administered 2012-09-20: 10 mL
  Administered 2012-09-20: 6 mL
  Administered 2012-09-20: 17 mL

## 2012-09-20 MED ORDER — LIDOCAINE-EPINEPHRINE 1 %-1:100000 IJ SOLN
INTRAMUSCULAR | Status: DC | PRN
  Administered 2012-09-20: 17 mL
  Administered 2012-09-20: 10 mL
  Administered 2012-09-20: 6 mL

## 2012-09-20 MED ORDER — MENTHOL 3 MG MT LOZG: 1.0000 | LOZENGE | OROMUCOSAL | Status: DC | PRN

## 2012-09-20 MED ORDER — SENNA 8.6 MG PO TABS
1.0000 | ORAL_TABLET | Freq: Two times a day (BID) | ORAL | Status: DC
  Administered 2012-09-20 – 2012-09-25 (×10): 8.6 mg via ORAL
  Filled 2012-09-20 (×16): qty 1

## 2012-09-20 MED ORDER — SIMVASTATIN 5 MG PO TABS
5.0000 mg | ORAL_TABLET | Freq: Every day | ORAL | Status: DC
  Administered 2012-09-21 – 2012-09-26 (×6): 5 mg via ORAL
  Filled 2012-09-20 (×8): qty 1

## 2012-09-20 MED ORDER — KCL IN DEXTROSE-NACL 20-5-0.45 MEQ/L-%-% IV SOLN
INTRAVENOUS | Status: DC
  Administered 2012-09-20: 1000 mL via INTRAVENOUS
  Administered 2012-09-20: 75 mL/h via INTRAVENOUS
  Administered 2012-09-21 – 2012-09-24 (×5): via INTRAVENOUS
  Filled 2012-09-20 (×19): qty 1000

## 2012-09-20 MED ORDER — METOPROLOL SUCCINATE ER 25 MG PO TB24
25.0000 mg | ORAL_TABLET | Freq: Every day | ORAL | Status: DC
  Filled 2012-09-20 (×8): qty 1

## 2012-09-20 MED ORDER — HYDROMORPHONE HCL PF 1 MG/ML IJ SOLN
0.5000 mg | INTRAMUSCULAR | Status: DC | PRN
  Administered 2012-09-20 – 2012-09-21 (×2): 0.5 mg via INTRAVENOUS
  Administered 2012-09-21: 1 mg via INTRAVENOUS
  Administered 2012-09-21 (×2): 0.5 mg via INTRAVENOUS
  Administered 2012-09-25 – 2012-09-26 (×3): 1 mg via INTRAVENOUS
  Filled 2012-09-20 (×9): qty 1

## 2012-09-20 SURGICAL SUPPLY — 92 items
BAG DECANTER FOR FLEXI CONT (MISCELLANEOUS) ×6 IMPLANT
BENZOIN TINCTURE PRP APPL 2/3 (GAUZE/BANDAGES/DRESSINGS) ×3 IMPLANT
BLADE SURG ROTATE 9660 (MISCELLANEOUS) IMPLANT
BONE VOID FILLER STRIP 10CC (Bone Implant) ×12 IMPLANT
BUR MATCHSTICK NEURO 3.0 LAGG (BURR) ×3 IMPLANT
BUR ROUND FLUTED 5 RND (BURR) ×3 IMPLANT
CAGE COROENT 12X18X50 (Cage) ×3 IMPLANT
CLOTH BEACON ORANGE TIMEOUT ST (SAFETY) ×6 IMPLANT
CONT SPEC 4OZ CLIKSEAL STRL BL (MISCELLANEOUS) ×3 IMPLANT
COROENT PLUS 8X18X45 (Cage) ×3 IMPLANT
COROENT PLUS 8X18X50 (Cage) ×3 IMPLANT
COROENT XL-W 10X22X50 (Orthopedic Implant) ×3 IMPLANT
COVER BACK TABLE 24X17X13 BIG (DRAPES) ×3 IMPLANT
COVER TABLE BACK 60X90 (DRAPES) ×3 IMPLANT
DERMABOND ADVANCED (GAUZE/BANDAGES/DRESSINGS) ×1
DERMABOND ADVANCED .7 DNX12 (GAUZE/BANDAGES/DRESSINGS) ×2 IMPLANT
DRAPE C-ARM 42X72 X-RAY (DRAPES) ×6 IMPLANT
DRAPE C-ARMOR (DRAPES) ×6 IMPLANT
DRAPE LAPAROTOMY 100X72X124 (DRAPES) ×6 IMPLANT
DRAPE POUCH INSTRU U-SHP 10X18 (DRAPES) ×6 IMPLANT
DRAPE SURG 17X23 STRL (DRAPES) ×6 IMPLANT
DRESSING TELFA 8X3 (GAUZE/BANDAGES/DRESSINGS) IMPLANT
DURAPREP 26ML APPLICATOR (WOUND CARE) ×6 IMPLANT
ELECT REM PT RETURN 9FT ADLT (ELECTROSURGICAL) ×6
ELECTRODE REM PT RTRN 9FT ADLT (ELECTROSURGICAL) ×4 IMPLANT
GAUZE SPONGE 4X4 16PLY XRAY LF (GAUZE/BANDAGES/DRESSINGS) ×9 IMPLANT
GLOVE BIO SURGEON STRL SZ8 (GLOVE) ×15 IMPLANT
GLOVE BIOGEL PI IND STRL 7.5 (GLOVE) ×2 IMPLANT
GLOVE BIOGEL PI IND STRL 8 (GLOVE) ×6 IMPLANT
GLOVE BIOGEL PI IND STRL 8.5 (GLOVE) ×10 IMPLANT
GLOVE BIOGEL PI INDICATOR 7.5 (GLOVE) ×1
GLOVE BIOGEL PI INDICATOR 8 (GLOVE) ×3
GLOVE BIOGEL PI INDICATOR 8.5 (GLOVE) ×5
GLOVE ECLIPSE 6.5 STRL STRAW (GLOVE) ×3 IMPLANT
GLOVE ECLIPSE 7.5 STRL STRAW (GLOVE) ×6 IMPLANT
GLOVE ECLIPSE 8.0 STRL XLNG CF (GLOVE) ×12 IMPLANT
GLOVE EXAM NITRILE LRG STRL (GLOVE) ×9 IMPLANT
GLOVE EXAM NITRILE MD LF STRL (GLOVE) IMPLANT
GLOVE EXAM NITRILE XL STR (GLOVE) IMPLANT
GLOVE EXAM NITRILE XS STR PU (GLOVE) IMPLANT
GLOVE SURG SS PI 8.0 STRL IVOR (GLOVE) ×9 IMPLANT
GOWN BRE IMP SLV AUR LG STRL (GOWN DISPOSABLE) IMPLANT
GOWN BRE IMP SLV AUR XL STRL (GOWN DISPOSABLE) ×12 IMPLANT
GOWN STRL REIN 2XL LVL4 (GOWN DISPOSABLE) ×12 IMPLANT
KIT BASIN OR (CUSTOM PROCEDURE TRAY) ×6 IMPLANT
KIT DILATOR XLIF 5 (KITS) ×2 IMPLANT
KIT INFUSE LRG II (Orthopedic Implant) ×3 IMPLANT
KIT MAXCESS (KITS) ×3 IMPLANT
KIT NEEDLE NVM5 EMG ELECT (KITS) ×2 IMPLANT
KIT NEEDLE NVM5 EMG ELECTRODE (KITS) ×1
KIT POSITION SURG JACKSON T1 (MISCELLANEOUS) IMPLANT
KIT ROOM TURNOVER OR (KITS) ×6 IMPLANT
KIT XLIF (KITS) ×1
MARKER SKIN DUAL TIP RULER LAB (MISCELLANEOUS) IMPLANT
MILL MEDIUM DISP (BLADE) ×3 IMPLANT
NEEDLE HYPO 25X1 1.5 SAFETY (NEEDLE) ×3 IMPLANT
NEEDLE TARGET 11MM (NEEDLE) ×6 IMPLANT
NEEDLE TARGETING (NEEDLE) ×6 IMPLANT
NS IRRIG 1000ML POUR BTL (IV SOLUTION) ×6 IMPLANT
PACK LAMINECTOMY NEURO (CUSTOM PROCEDURE TRAY) ×6 IMPLANT
PAD ARMBOARD 7.5X6 YLW CONV (MISCELLANEOUS) ×9 IMPLANT
PAD SHARPS MAGNETIC DISPOSAL (MISCELLANEOUS) ×3 IMPLANT
PATTIES SURGICAL .5 X.5 (GAUZE/BANDAGES/DRESSINGS) IMPLANT
PATTIES SURGICAL .5 X1 (DISPOSABLE) IMPLANT
PATTIES SURGICAL 1X1 (DISPOSABLE) IMPLANT
PEEK PLIF NOVEL 9X25X8MM (Peek) ×9 IMPLANT
SCREW CANN PA ILLICO 6.5X45 (Screw) ×6 IMPLANT
SCREW SACR CANN ILLICO 6.5X35 (Screw) ×6 IMPLANT
SCREW SACR CANN ILLICO 6.5X40 (Screw) IMPLANT
SCREW SACR CANN ILLICO 7.5X35 (Screw) ×3 IMPLANT
SCREW SET SPINAL STD HEXALOBE (Screw) ×33 IMPLANT
SPONGE GAUZE 4X4 12PLY (GAUZE/BANDAGES/DRESSINGS) ×3 IMPLANT
SPONGE LAP 4X18 X RAY DECT (DISPOSABLE) IMPLANT
SPONGE SURGIFOAM ABS GEL SZ50 (HEMOSTASIS) ×3 IMPLANT
STAPLER SKIN PROX WIDE 3.9 (STAPLE) ×3 IMPLANT
STRIP CLOSURE SKIN 1/2X4 (GAUZE/BANDAGES/DRESSINGS) ×3 IMPLANT
SUT VIC AB 1 CT1 18XBRD ANBCTR (SUTURE) ×10 IMPLANT
SUT VIC AB 1 CT1 8-18 (SUTURE) ×5
SUT VIC AB 2-0 CT1 18 (SUTURE) ×15 IMPLANT
SUT VIC AB 3-0 SH 8-18 (SUTURE) ×21 IMPLANT
SYR 20ML ECCENTRIC (SYRINGE) ×6 IMPLANT
SYR 3ML LL SCALE MARK (SYRINGE) ×6 IMPLANT
SYR INSULIN 1ML 31GX6 SAFETY (SYRINGE) IMPLANT
TAPE CLOTH 3X10 TAN LF (GAUZE/BANDAGES/DRESSINGS) ×9 IMPLANT
TAPE CLOTH SURG 4X10 WHT LF (GAUZE/BANDAGES/DRESSINGS) ×3 IMPLANT
TIP TROCAR NITINOL ILLICO 18 (INSTRUMENTS) ×3 IMPLANT
TOWEL OR 17X24 6PK STRL BLUE (TOWEL DISPOSABLE) ×9 IMPLANT
TOWEL OR 17X26 10 PK STRL BLUE (TOWEL DISPOSABLE) ×6 IMPLANT
TRAP SPECIMEN MUCOUS 40CC (MISCELLANEOUS) ×3 IMPLANT
TRAY FOLEY CATH 14FRSI W/METER (CATHETERS) ×3 IMPLANT
WATER STERILE IRR 1000ML POUR (IV SOLUTION) ×6 IMPLANT
guide wire ×33 IMPLANT

## 2012-09-20 NOTE — Interval H&P Note (Signed)
History and Physical Interval Note:  09/20/2012 6:46 AM  [name] [name]  has presented today for surgery, with the diagnosis of Spondylolisthesis, Lumbar stenosis, Scoliosis, Lumbar Radiculopathy  The various methods of treatment have been discussed with the patient and family. After consideration of risks, benefits and other options for treatment, the patient has consented to  Procedure(s) with comments: ANTERIOR LATERAL LUMBAR FUSION 4 LEVELS (Right) - Right L1-2 L2-3 L3-4 L4-5 Anterolateral decompression/fusion/with L5-S1 Posterior lumbar interbody fusion/Percutaneous pedicle screws L1-S1 LUMBAR PERCUTANEOUS PEDICLE SCREW 4 LEVEL (N/A) as a surgical intervention .  The patient's history has been reviewed, patient examined, no change in status, stable for surgery.  I have reviewed the patient's chart and labs.  Questions were answered to the patient's satisfaction.     STERN,JOSEPH D

## 2012-09-20 NOTE — Progress Notes (Signed)
Awake, alert, conversant.  No complaints of leg pain.  Full strength both lower extremities. No numbness.  Doing well.

## 2012-09-20 NOTE — Transfer of Care (Signed)
Immediate Anesthesia Transfer of Care Note  Patient: [name] [name]  Procedure(s) Performed: Procedure(s) with comments: ANTERIOR LATERAL LUMBAR FUSION 4 LEVELS (Right) - Right L1-2 L2-3 L3-4 L4-5 Anterolateral decompression/fusion/with L5-S1 Posterior lumbar interbody fusion/Percutaneous pedicle screws L1-S1 LUMBAR PERCUTANEOUS PEDICLE SCREW 4 LEVEL (Bilateral) - Posterior Lumbar Five-Sacral One Interbody and Fusion and Percutaneous Screws Lumbar One-Sacral One  Patient Location: PACU  Anesthesia Type:General  Level of Consciousness: awake  Airway & Oxygen Therapy: Patient Spontanous Breathing and Patient connected to nasal cannula oxygen  Post-op Assessment: Report given to PACU RN, Post -op Vital signs reviewed and stable and Patient moving all extremities  Post vital signs: Reviewed and stable  Complications: No apparent anesthesia complications

## 2012-09-20 NOTE — Brief Op Note (Signed)
09/20/2012  3:46 PM  PATIENT:  [name] [name]  67 y.o. female  PRE-OPERATIVE DIAGNOSIS:  Spondylolisthesis, Lumbar stenosis, Scoliosis, Lumbar Radiculopathy L 1 through S 1 levels  POST-OPERATIVE DIAGNOSIS:  Spondylolisthesis, Lumbar stenosis, Scoliosis, Lumbar Radiculopathy L 1 through S 1 levels  PROCEDURE:  Procedure(s) with comments: ANTERIOR LATERAL LUMBAR FUSION 4 LEVELS (Right) - Right L1-2 L2-3 L3-4 L4-5 Anterolateral decompression/fusion/with L5-S1 Posterior lumbar interbody fusion/Percutaneous pedicle screws L1-S1 LUMBAR PERCUTANEOUS PEDICLE SCREW 4 LEVEL (Bilateral) - Posterior Lumbar Five-Sacral One Interbody and Fusion and Percutaneous Screws Lumbar One-Sacral One with posterolateral arthrodesis L 5 - S 1  SURGEON:  Surgeon(s) and Role:    * Maeola Harman, MD - Primary  PHYSICIAN ASSISTANT: Franky Macho, MD  ASSISTANTS: Poteat, RN   ANESTHESIA:   general  EBL:  Total I/O In: 4900 [I.V.:4050; Blood:350; IV Piggyback:500] Out: 1055 [Urine:655; Blood:400]  BLOOD ADMINISTERED:1 unit CC PRBC  DRAINS: none   LOCAL MEDICATIONS USED:  MARCAINE     SPECIMEN:  No Specimen  DISPOSITION OF SPECIMEN:  N/A  COUNTS:  YES  TOURNIQUET:  * No tourniquets in log *  DICTATION: Patient is a 67 year old woman with severe spondylolisthesis,  stenosis and scoliosis of the lumbar spine from L 1 through S 1 levels. It was elected to take her to surgery for anterolateral decompression and posterior pedicle screw fixation with posterior decompression and interbody cages L 5 - S 1 and pedicle screw fixation L 1 through S 1 levels .  Procedure: Patient was brought to the operating room and placed in a left lateral decubitus position on the operative table and using orthogonally projected C-arm fluoroscopy the patient was placed so that the L1-2, L2-3,  L3-4 and L4-5 levels were visualized in AP and lateral plane. The patient was then taped into position. The table was flexed so as to expose the  L4-5 level as the patient has a high iliac crest. Skin was marked along with a posterior finger dissection incision. Her flank was then prepped and draped in usual sterile fashion and incisions were made sequentially at L4-5 L3-4 and L 12 levels. Posterior finger dissection was made to enter the retroperitoneal space and then subsequently the probe was inserted into the psoas muscle from the right side initially at the L4-5 level. After mapping the neural elements were able to dock the probe per the midpoint of this vertebral level and without indications electrically of too close proximity to the neural tissues. Subsequently the self-retaining tractor was.after sequential dilators were utilized the shim was employed and the interspace was cleared of psoas muscle and then incised. A thorough discectomy was performed. Angled instruments were used to clear the interspace of disc material. After thorough discectomy was performed and this was performed using AP and lateral fluoroscopy a 12 lordotic by 50 x 18 mm implant was packed with BMP and NexOss with autologous blood. This was tamped into position using the slides and its position was confirmed on AP and lateral fluoroscopy. Subsequently exposure was performed at the L3-4 level and similar dissection was performed with locking of the self-retaining retractor. At this level were able to place a 10 lordotic by 22 x 50 mm implant packed in a similar fashion. At the L2-3 level we were able to place an 8 mm standard by 50 x 18 mm implant packed in a similar fashion through the same incision as the L34 level. At L12, retoperitoneal dissection was again performed and entry was made just above the 12th  rib.  A similar decompression was performed and an 18 x 8 x 45 mm implant was placed. Hemostasis was assured the wounds were irrigated and closed with  interrupted Vicryl sutures.  Patient was then turned into a prone position on the operating table on chest rolls.  Her  low back was prepped and draped in usual sterile fashion with betadine scrub and DuraPrep. Area of incision was infiltrated with local lidocaine. Incision was made to the lumbodorsal fascia was incised and exposure was performed of the L5-S1 spinous processes laminae facet joint and transverse processes. Intraoperative x-ray was obtained which confirmed correct orientation. A total laminectomy of L5 was performed with disarticulation of the facet joints at this level and thorough decompression was performed of both L5 and S1 nerve roots along with the common dural tube with preservation of midline structures. A thorough discectomy was initially performed on the left with preparation of the endplates for grafting a trial spacer was placed this level and a thorough discectomy was performed on the right as well. Bone autograft was packed within the interspace bilaterally along with remaining BMP and NexOss bone graft extender. Bilateral median 8 mm peek cages were packed with BMP and extender and was inserted the interspace and countersunk appropriately.  Approximately 4 cc of morselized local autograft was packed in the interspace deep to the cages.The posterolateral region was extensively decorticated and remaining allograft material was packed in posterolaterally  at L5 and S1 bilaterally. Intraoperative fluoroscopy confirmed correct orientationin the AP and lateral plane. At this point, the midline incision was closed with 1, 2-0 and 3-0 vicryl sutures. Then,  using AP and lateral fluoroscopy throughout this portion of the procedure, pedicle screws were placed using alphatech cannulated percutaneous screws. 2 screws were placed at L1 and (6.5 x 45 mm) and 1 at L2 and two at L3 (6.5 x 40 mm) ,  2 at L5 of similar size and 2 at S1 (7.5 x 35 mm on the left and 6.5 x 35 mm on the right).  I was unable to place screws at L4 and elected not to place screws at this level when, after initial placement, they appeared loose  and on AP film had fractuured out laterally. 170 mm rod was then affixed to the screw heads on the left and a 160 mm rod on the right do a separate stab incision and locked down on the screws. All connections were then torqued and the Towers were disassembled. The wounds were irrigated and then closed with 1, 2-0 and 3-0 Vicryl stitches. Sterile occlusive dressing was placed with Dermabond and an occlusive dressing. The patient was then extubated in the operating room and taken to recovery in stable and satisfactory condition having tolerated his operation well. Counts were correct at the end of the case.   PLAN OF CARE: Admit to inpatient   PATIENT DISPOSITION:  PACU - hemodynamically stable.   Delay start of Pharmacological VTE agent (>24hrs) due to surgical blood loss or risk of bleeding: yes

## 2012-09-20 NOTE — Progress Notes (Signed)
I stat done by Gerlene Burdock CRNA  K 2.4  NAQ 136 GL 188 Hgb 10.9 Hct   32 Richard caled Dr. Jean Rosenthal orders will be written for K

## 2012-09-20 NOTE — Op Note (Signed)
09/20/2012  3:46 PM  PATIENT:  [name] [name]  67 y.o. female  PRE-OPERATIVE DIAGNOSIS:  Spondylolisthesis, Lumbar stenosis, Scoliosis, Lumbar Radiculopathy L 1 through S 1 levels  POST-OPERATIVE DIAGNOSIS:  Spondylolisthesis, Lumbar stenosis, Scoliosis, Lumbar Radiculopathy L 1 through S 1 levels  PROCEDURE:  Procedure(s) with comments: ANTERIOR LATERAL LUMBAR FUSION 4 LEVELS (Right) - Right L1-2 L2-3 L3-4 L4-5 Anterolateral decompression/fusion/with L5-S1 Posterior lumbar interbody fusion/Percutaneous pedicle screws L1-S1 LUMBAR PERCUTANEOUS PEDICLE SCREW 4 LEVEL (Bilateral) - Posterior Lumbar Five-Sacral One Interbody and Fusion and Percutaneous Screws Lumbar One-Sacral One with posterolateral arthrodesis L 5 - S 1  SURGEON:  Surgeon(s) and Role:    * Joseph Stern, MD - Primary  PHYSICIAN ASSISTANT: Cabbell, MD  ASSISTANTS: Poteat, RN   ANESTHESIA:   general  EBL:  Total I/O In: 4900 [I.V.:4050; Blood:350; IV Piggyback:500] Out: 1055 [Urine:655; Blood:400]  BLOOD ADMINISTERED:1 unit CC PRBC  DRAINS: none   LOCAL MEDICATIONS USED:  MARCAINE     SPECIMEN:  No Specimen  DISPOSITION OF SPECIMEN:  N/A  COUNTS:  YES  TOURNIQUET:  * No tourniquets in log *  DICTATION: Patient is a 67-year-old woman with severe spondylolisthesis,  stenosis and scoliosis of the lumbar spine from L 1 through S 1 levels. It was elected to take her to surgery for anterolateral decompression and posterior pedicle screw fixation with posterior decompression and interbody cages L 5 - S 1 and pedicle screw fixation L 1 through S 1 levels .  Procedure: Patient was brought to the operating room and placed in a left lateral decubitus position on the operative table and using orthogonally projected C-arm fluoroscopy the patient was placed so that the L1-2, L2-3,  L3-4 and L4-5 levels were visualized in AP and lateral plane. The patient was then taped into position. The table was flexed so as to expose the  L4-5 level as the patient has a high iliac crest. Skin was marked along with a posterior finger dissection incision. Her flank was then prepped and draped in usual sterile fashion and incisions were made sequentially at L4-5 L3-4 and L 12 levels. Posterior finger dissection was made to enter the retroperitoneal space and then subsequently the probe was inserted into the psoas muscle from the right side initially at the L4-5 level. After mapping the neural elements were able to dock the probe per the midpoint of this vertebral level and without indications electrically of too close proximity to the neural tissues. Subsequently the self-retaining tractor was.after sequential dilators were utilized the shim was employed and the interspace was cleared of psoas muscle and then incised. A thorough discectomy was performed. Angled instruments were used to clear the interspace of disc material. After thorough discectomy was performed and this was performed using AP and lateral fluoroscopy a 12 lordotic by 50 x 18 mm implant was packed with BMP and NexOss with autologous blood. This was tamped into position using the slides and its position was confirmed on AP and lateral fluoroscopy. Subsequently exposure was performed at the L3-4 level and similar dissection was performed with locking of the self-retaining retractor. At this level were able to place a 10 lordotic by 22 x 50 mm implant packed in a similar fashion. At the L2-3 level we were able to place an 8 mm standard by 50 x 18 mm implant packed in a similar fashion through the same incision as the L34 level. At L12, retoperitoneal dissection was again performed and entry was made just above the 12th   rib.  A similar decompression was performed and an 18 x 8 x 45 mm implant was placed. Hemostasis was assured the wounds were irrigated and closed with  interrupted Vicryl sutures.  Patient was then turned into a prone position on the operating table on chest rolls.  Her  low back was prepped and draped in usual sterile fashion with betadine scrub and DuraPrep. Area of incision was infiltrated with local lidocaine. Incision was made to the lumbodorsal fascia was incised and exposure was performed of the L5-S1 spinous processes laminae facet joint and transverse processes. Intraoperative x-ray was obtained which confirmed correct orientation. A total laminectomy of L5 was performed with disarticulation of the facet joints at this level and thorough decompression was performed of both L5 and S1 nerve roots along with the common dural tube with preservation of midline structures. A thorough discectomy was initially performed on the left with preparation of the endplates for grafting a trial spacer was placed this level and a thorough discectomy was performed on the right as well. Bone autograft was packed within the interspace bilaterally along with remaining BMP and NexOss bone graft extender. Bilateral median 8 mm peek cages were packed with BMP and extender and was inserted the interspace and countersunk appropriately.  Approximately 4 cc of morselized local autograft was packed in the interspace deep to the cages.The posterolateral region was extensively decorticated and remaining allograft material was packed in posterolaterally  at L5 and S1 bilaterally. Intraoperative fluoroscopy confirmed correct orientationin the AP and lateral plane. At this point, the midline incision was closed with 1, 2-0 and 3-0 vicryl sutures. Then,  using AP and lateral fluoroscopy throughout this portion of the procedure, pedicle screws were placed using alphatech cannulated percutaneous screws. 2 screws were placed at L1 and (6.5 x 45 mm) and 1 at L2 and two at L3 (6.5 x 40 mm) ,  2 at L5 of similar size and 2 at S1 (7.5 x 35 mm on the left and 6.5 x 35 mm on the right).  I was unable to place screws at L4 and elected not to place screws at this level when, after initial placement, they appeared loose  and on AP film had fractuured out laterally. 170 mm rod was then affixed to the screw heads on the left and a 160 mm rod on the right do a separate stab incision and locked down on the screws. All connections were then torqued and the Towers were disassembled. The wounds were irrigated and then closed with 1, 2-0 and 3-0 Vicryl stitches. Sterile occlusive dressing was placed with Dermabond and an occlusive dressing. The patient was then extubated in the operating room and taken to recovery in stable and satisfactory condition having tolerated his operation well. Counts were correct at the end of the case.   PLAN OF CARE: Admit to inpatient   PATIENT DISPOSITION:  PACU - hemodynamically stable.   Delay start of Pharmacological VTE agent (>24hrs) due to surgical blood loss or risk of bleeding: yes  

## 2012-09-20 NOTE — Anesthesia Procedure Notes (Signed)
Procedure Name: Intubation Date/Time: 09/20/2012 7:42 AM Performed by: Luster Landsberg Pre-anesthesia Checklist: Patient identified, Emergency Drugs available, Suction available and Patient being monitored Patient Re-evaluated:Patient Re-evaluated prior to inductionOxygen Delivery Method: Circle system utilized Preoxygenation: Pre-oxygenation with 100% oxygen Intubation Type: IV induction and Cricoid Pressure applied Grade View: Grade I Tube type: Oral Tube size: 7.0 mm Number of attempts: 1 Airway Equipment and Method: Video-laryngoscopy Placement Confirmation: ETT inserted through vocal cords under direct vision,  positive ETCO2 and breath sounds checked- equal and bilateral Secured at: 22 cm Tube secured with: Tape Dental Injury: Teeth and Oropharynx as per pre-operative assessment  Comments: Anectine used r/t need for monitoring upon incision.  Glidescope used r/t decreased thyromental distance, narrow palate, and prominent front teeth,

## 2012-09-20 NOTE — Preoperative (Signed)
Beta Blockers   Reason not to administer Beta Blockers:Not Applicable 

## 2012-09-20 NOTE — Anesthesia Preprocedure Evaluation (Signed)
Anesthesia Evaluation  Patient identified by MRN, date of birth, ID band Patient awake    Reviewed: Allergy & Precautions, H&P , NPO status , Patient's Chart, lab work & pertinent test results, reviewed documented beta blocker date and time   History of Anesthesia Complications (+) PONV  Airway Mallampati: II TM Distance: >3 FB Neck ROM: Full    Dental no notable dental hx. (+) Teeth Intact and Dental Advisory Given   Pulmonary asthma (rare inhaler use) ,  breath sounds clear to auscultation  Pulmonary exam normal       Cardiovascular hypertension, Pt. on medications and Pt. on home beta blockers Rhythm:Regular Rate:Normal     Neuro/Psych  Headaches, Chronic back pain: hydrocodone  Neuromuscular disease (RSD L hand)    GI/Hepatic Neg liver ROS, GERD-  Medicated and Controlled,  Endo/Other  negative endocrine ROS  Renal/GU negative Renal ROS     Musculoskeletal   Abdominal   Peds  Hematology negative hematology ROS (+)   Anesthesia Other Findings   Reproductive/Obstetrics                           Anesthesia Physical Anesthesia Plan  ASA: III  Anesthesia Plan: General   Post-op Pain Management:    Induction: Intravenous  Airway Management Planned: Oral ETT  Additional Equipment:   Intra-op Plan:   Post-operative Plan: Extubation in OR  Informed Consent: I have reviewed the patients History and Physical, chart, labs and discussed the procedure including the risks, benefits and alternatives for the proposed anesthesia with the patient or authorized representative who has indicated his/her understanding and acceptance.   Dental advisory given  Plan Discussed with: Surgeon and CRNA  Anesthesia Plan Comments: (Plan routine monitors, A -line GETA)        Anesthesia Quick Evaluation

## 2012-09-20 NOTE — Anesthesia Postprocedure Evaluation (Signed)
  Anesthesia Post-op Note  Patient: [name] [name]  Procedure(s) Performed: Procedure(s) with comments: ANTERIOR LATERAL LUMBAR FUSION 4 LEVELS (Right) - Right L1-2 L2-3 L3-4 L4-5 Anterolateral decompression/fusion/with L5-S1 Posterior lumbar interbody fusion/Percutaneous pedicle screws L1-S1 LUMBAR PERCUTANEOUS PEDICLE SCREW 4 LEVEL (Bilateral) - Posterior Lumbar Five-Sacral One Interbody and Fusion and Percutaneous Screws Lumbar One-Sacral One  Patient Location: PACU  Anesthesia Type:General  Level of Consciousness: awake, alert , oriented and patient cooperative  Airway and Oxygen Therapy: Patient Spontanous Breathing and Patient connected to nasal cannula oxygen  Post-op Pain: mild  Post-op Assessment: Post-op Vital signs reviewed, Patient's Cardiovascular Status Stable, Respiratory Function Stable, Patent Airway, No signs of Nausea or vomiting and Pain level controlled  Post-op Vital Signs: Reviewed and stable  Complications: No apparent anesthesia complications

## 2012-09-20 NOTE — Progress Notes (Signed)
Dr. Thurmon Fair called for a sign out K may be given in the SICU

## 2012-09-21 DIAGNOSIS — M419 Scoliosis, unspecified: Secondary | ICD-10-CM

## 2012-09-21 LAB — TYPE AND SCREEN
Antibody Screen: NEGATIVE
Unit division: 0

## 2012-09-21 LAB — CBC
HCT: 26.8 % — ABNORMAL LOW (ref 36.0–46.0)
Hemoglobin: 9.4 g/dL — ABNORMAL LOW (ref 12.0–15.0)
MCHC: 35.1 g/dL (ref 30.0–36.0)

## 2012-09-21 LAB — POCT I-STAT 4, (NA,K, GLUC, HGB,HCT): Potassium: 2.3 mEq/L — CL (ref 3.5–5.1)

## 2012-09-21 LAB — BASIC METABOLIC PANEL
Calcium: 8.2 mg/dL — ABNORMAL LOW (ref 8.4–10.5)
GFR calc non Af Amer: >90 mL/min (ref >90)
Glucose, Bld: 138 mg/dL — ABNORMAL HIGH (ref 70–99)
Sodium: 134 mEq/L — ABNORMAL LOW (ref 135–145)

## 2012-09-21 MED ORDER — POTASSIUM CHLORIDE 10 MEQ/100ML IV SOLN
INTRAVENOUS | Status: AC
  Filled 2012-09-21: qty 300

## 2012-09-21 MED ORDER — SODIUM CHLORIDE 0.9 % IV BOLUS (SEPSIS)
500.0000 mL | Freq: Once | INTRAVENOUS | Status: AC
  Administered 2012-09-21: 500 mL via INTRAVENOUS

## 2012-09-21 MED ORDER — POTASSIUM CHLORIDE 10 MEQ/100ML IV SOLN
INTRAVENOUS | Status: AC
  Filled 2012-09-21: qty 100

## 2012-09-21 MED ORDER — POTASSIUM CHLORIDE 10 MEQ/100ML IV SOLN
10.0000 meq | INTRAVENOUS | Status: AC
  Administered 2012-09-21 – 2012-09-22 (×3): 10 meq via INTRAVENOUS

## 2012-09-21 MED ORDER — POTASSIUM CHLORIDE 10 MEQ/100ML IV SOLN
10.0000 meq | INTRAVENOUS | Status: AC
  Administered 2012-09-21 (×3): 10 meq via INTRAVENOUS
  Filled 2012-09-21 (×2): qty 100

## 2012-09-21 NOTE — Progress Notes (Signed)
Subjective: Patient reports "I slept for a couple times last night."  "It hurts in my right thigh some."  Objective: Vital signs in last 24 hours: Temp:  [97.7 F (36.5 C)-98.7 F (37.1 C)] 98.7 F (37.1 C) (03/07 0850) Pulse Rate:  [52-77] 77 (03/07 0700) Resp:  [8-19] 15 (03/07 0700) BP: (80-108)/(42-62) 108/47 mmHg (03/07 0700) SpO2:  [96 %-100 %] 99 % (03/07 0700) Arterial Line BP: (67-133)/(42-76) 78/60 mmHg (03/07 0700)  Intake/Output from previous day: 03/06 0701 - 03/07 0700 In: 6350 [I.V.:5200; Blood:350; IV Piggyback:800] Out: 1715 [Urine:1215; Blood:500] Intake/Output this shift:    Alert, conversant, but weak. Strength good BLE. RLE movement limited by thigh pain this am. BP's low through night.  Lab Results:  Recent Labs  09/20/12 1500 09/20/12 1703  WBC 14.4*  --   HGB 9.9* 10.9*  HCT 27.6* 32.0*  PLT 231  --    BMET  Recent Labs  09/20/12 1703 09/21/12 0520  NA 136 134*  K 2.4* 3.0*  CL  --  98  CO2  --  29  GLUCOSE 188* 138*  BUN  --  9  CREATININE  --  0.58  CALCIUM  --  8.2*    Studies/Results: Dg Lumbar Spine Complete  09/20/2012  *RADIOLOGY REPORT*  Clinical Data:  DG C-ARM GT 120 MIN,LUMBAR SPINE -  4+ VIEW  Comparison: 06/20/2012  Findings: Multiple intraoperative six intraoperative spot images are submitted demonstrating changes of  posterior lumbar fusion. The superior level appears to be at L1.  The inferior level is difficult to determine on these intraoperative spot images, likely S1.  Normal alignment.  No hardware or bony complicating feature.  IMPRESSION: Posterior lumbar fusion changes as above.   Original Report Authenticated By: Charlett Nose, M.D.    Dg Lumbar Spine 1 View  09/20/2012  *RADIOLOGY REPORT*  Clinical Data: Lumbar fusion.  LUMBAR SPINE - 1 VIEW  Comparison: 06/20/2012  Findings: Posterior surgical instruments are noted directed at the L5 and S1 levels.  IMPRESSION: Intraoperative localization as above.    Original Report Authenticated By: Charlett Nose, M.D.    Dg C-arm Gt 120 Min  09/20/2012  *RADIOLOGY REPORT*  Clinical Data:  DG C-ARM GT 120 MIN,LUMBAR SPINE -  4+ VIEW  Comparison: 06/20/2012  Findings: Multiple intraoperative six intraoperative spot images are submitted demonstrating changes of  posterior lumbar fusion. The superior level appears to be at L1.  The inferior level is difficult to determine on these intraoperative spot images, likely S1.  Normal alignment.  No hardware or bony complicating feature.  IMPRESSION: Posterior lumbar fusion changes as above.   Original Report Authenticated By: Charlett Nose, M.D.     Assessment/Plan:    LOS: 1 day  Will stay in ICU today, monitoring BP. Will check another CBC. Mobilize with PT when able.   Georgiann Cocker 09/21/2012, 9:47 AM

## 2012-09-21 NOTE — Progress Notes (Signed)
Patient transfused 1 unit PRBCs yesterday for Intraoperative Expected Acute Blood Loss Anemia. Has received potassium replacement, K 3.0 today, will receive additional K today.  Observe in ICU today with low blood pressure.

## 2012-09-21 NOTE — Evaluation (Signed)
Physical Therapy Evaluation Patient Details Name: [name] [name] MRN: [id number] DOB: [birth year] Today's Date: 09/21/2012 Time: 1308-6578 PT Time Calculation (min): 59 min  PT Assessment / Plan / Recommendation Clinical Impression  67 y.o. female admitted to [facility] s/p anterior lateral lumbar fusion L1-S1.  She presents today POD #1 with increased pain, paresthesias in both thighs (laterally), decreased mobility, decreased gait ability, decreased knowledge of precautions and brace use.  She did well getting to [facility] and then recliner chair with RW, but felt lightheaded with decreased BP, so further gait was not attempted.  Husband was present for treatment session and will be primary caregiver at discharge.      PT Assessment  Patient needs continued PT services    Follow Up Recommendations  Home health PT;Supervision/Assistance - 24 hour    Does the patient have the potential to tolerate intense rehabilitation     NA  Barriers to Discharge None None    Equipment Recommendations    none- reports a BSC will not fit in her bathroom   Recommendations for Other Services   none  Frequency Min 5X/week    Precautions / Restrictions Precautions Precautions: Back Precaution Booklet Issued: No (posted precautions on board in room) Required Braces or Orthoses: Spinal Brace Spinal Brace: Applied in supine position   Pertinent Vitals/Pain BP hypotensive, pt moderately symptomatic, but able to continue.  See vitals flow sheet for details.       Mobility  Bed Mobility Bed Mobility: Rolling Right;Rolling Left;Left Sidelying to Sit;Sitting - Scoot to [place] of Bed Rolling Right: 4: Min assist;With rail Rolling Left: 4: Min assist;With rail Left Sidelying to Sit: 3: Mod assist;With rails;HOB flat Sitting - Scoot to [place] of Bed: 4: Min assist Details for Bed Mobility Assistance: min assist to help pt reach for rail and get all the way to side lying from supine bil to donn brace in bed.  Mod assist  to support trunk to get to sitting from left sidelying.  Verbal cues for log roll technique and visual demo to husband of brace application.   Transfers Transfers: Sit to Stand;Stand to Sit Sit to Stand: 4: Min assist;With upper extremity assist Stand to Sit: 4: Min assist Stand Pivot Transfers: 4: Min assist Details for Transfer Assistance: min assist to get to standing over flexed knees supported on bed for stability.  Min assist to take 2-3 steps to [facility] and then to recliner chair transfering to both the left and right sides with RW.  Verbal cues for safe hand placement and RW use.  Pt lightheaded EOB, but was able to continue to participate with treatment session.   Ambulation/Gait Ambulation/Gait Assistance: Not tested (comment) (due to low BP and pt lightheaded in sitting. )        PT Diagnosis: Difficulty walking;Abnormality of gait;Generalized weakness;Acute pain  PT Problem List: Decreased strength;Decreased activity tolerance;Decreased balance;Decreased mobility;Decreased knowledge of use of DME;Decreased knowledge of precautions;Pain PT Treatment Interventions: DME instruction;Gait training;Stair training;Functional mobility training;Therapeutic activities;Therapeutic exercise;Balance training;Neuromuscular re-education;Patient/family education   PT Goals Acute Rehab PT Goals PT Goal Formulation: With patient/family Time For Goal Achievement: 09/28/12 Potential to Achieve Goals: Good Pt will Roll Supine to Right Side: with modified independence PT Goal: Rolling Supine to Right Side - Progress: Goal set today Pt will Roll Supine to Left Side: with modified independence PT Goal: Rolling Supine to Left Side - Progress: Goal set today Pt will go Supine/Side to Sit: with modified independence PT Goal: Supine/Side to Sit - Progress:  Goal set today Pt will go Sit to Supine/Side: with min assist;with HOB 0 degrees PT Goal: Sit to Supine/Side - Progress: Goal set today Pt will go Sit to  Stand: with supervision PT Goal: Sit to Stand - Progress: Goal set today Pt will go Stand to Sit: with supervision PT Goal: Stand to Sit - Progress: Goal set today Pt will Transfer Bed to Chair/Chair to Bed: with supervision PT Transfer Goal: Bed to Chair/Chair to Bed - Progress: Goal set today Pt will Ambulate: 51 - 150 feet;with supervision;with rolling walker PT Goal: Ambulate - Progress: Goal set today Pt will Go Up / Down Stairs: 3-5 stairs;with rail(s);with min assist PT Goal: Up/Down Stairs - Progress: Goal set today Additional Goals Additional Goal #1: Pt will independently report and demonstrate 3/3 back precautions during her treatment session PT Goal: Additional Goal #1 - Progress: Goal set today  Visit Information  Last PT Received On: 09/21/12 Assistance Needed: +1    Subjective Data  Subjective: Pt reports that her left leg is traditionally her weaker leg.   Patient Stated Goal: to decrease pain, get home   Prior Functioning  Home Living Lives With: Spouse Available Help at Discharge: Available 24 hours/day;Family Type of Home: House Home Access: Stairs to enter [employer] of Steps: 4 Entrance Stairs-Rails: Right;Left Home Layout: One level (with basement, but pt doesn't need to go into basement) Bathroom Shower/Tub: Tub/shower unit;Curtain Bathroom Toilet: Handicapped height Bathroom Accessibility: No Home Adaptive Equipment: Shower chair without back;Walker - rolling;Wheelchair - manual (can get a WC from church if they need it.) Prior Function Level of Independence: Independent Able to Take Stairs?: Yes Driving: Yes 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 Communication Communication: No difficulties Dominant Hand: Right    Cognition  Cognition Overall Cognitive Status: Appears within functional limits for tasks assessed/performed Arousal/Alertness: Awake/alert Orientation Level: Oriented X4 / Intact Behavior During  Session: [facility] for tasks performed    Extremity/Trunk Assessment Left Upper Extremity Assessment LUE Sensation: Deficits (h/o carpal tunnel and nerve issues left arm and hand) Right Lower Extremity Assessment RLE ROM/Strength/Tone: Deficits RLE ROM/Strength/Tone Deficits: grossly 3-/5 RLE Sensation: Deficits RLE Sensation Deficits: thigh to knee Left Lower Extremity Assessment LLE ROM/Strength/Tone: Deficits LLE ROM/Strength/Tone Deficits: grossly 3-/5 LLE Sensation: Deficits LLE Sensation Deficits: thigh to knee   Balance Static Sitting Balance Static Sitting - Balance Support: Bilateral upper extremity supported;Feet supported Static Sitting - Level of Assistance: 5: Stand by assistance Static Sitting - Comment/# of Minutes: 10 mins EOB in prep for going to [facility] and to help try to regulate the sesation of light headedness Static Standing Balance Static Standing - Balance Support: Bilateral upper extremity supported Static Standing - Level of Assistance: 4: Min assist Static Standing - Comment/# of Minutes: min assist of trunk over flexed knees with RW for support.    End of Session PT - End of Session Equipment Utilized During Treatment: Back brace Activity Tolerance: Patient limited by fatigue;Patient limited by pain Patient left: in chair;with call bell/phone within reach;with family/visitor present (husband in room) Nurse Communication: Mobility status (to sit up until 1530)       Azaya Goedde B. Venetta Knee, PT, DPT [phone]   09/21/2012, 3:04 PM

## 2012-09-21 NOTE — Progress Notes (Signed)
Notified Dr. Franky Macho regarding continued hypotension. Order for 500cc bolus and increase of maintenance fluid from 75 to 100cc/hr. Will continue to monitor.

## 2012-09-21 NOTE — Progress Notes (Signed)
UR completed 

## 2012-09-21 NOTE — Progress Notes (Signed)
PT Cancellation Note  Patient Details Name: BRIDGETT HATTABAUGH MRN: [id number] DOB: 12/14/1945   Cancelled Treatment:    Reason Eval/Treat Not Completed: Other (comment) (husband to bring brace today, but weather bad), so husband has not made it to the hospital yet.  Informed RN.  PT/OT to check back later.     Rollene Rotunda Medendorp, PT, DPT [phone]   09/21/2012, 8:59 AM

## 2012-09-21 NOTE — Clinical Documentation Improvement (Signed)
Anemia Blood Loss Clarification  THIS DOCUMENT IS NOT A PERMANENT PART OF THE MEDICAL RECORD         09/21/12  Dear Dr.Stern,  In an effort to better capture your patient's severity of illness, reflect appropriate length of stay and utilization of resources, a review of the patient medical record has revealed the following indicators.   Based on your clinical judgment, please clarify and document in a progress note and/or discharge summary the clinical condition associated with the following supporting information: In responding to this query please exercise your independent judgment.  The fact that a query is asked, does not imply that any particular answer is desired or expected.   Hello Dr. Venetia Maxon!  Jana was admitted for surgical treatment of Spondylolisthesis, Lumbar stenosis, Scoliosis, Lumbar Radiculopathy L 1 through S 1 levels. Per the ANTERIOR LATERAL LUMBAR FUSION Operative Record: Blood Loss = and the patient received 1 unit of PRBCs intraoperatively. If possible, please help by clarifying the suspected diagnosis requiring the PRBCs. Thank you!   Possible Clinical Conditions?  - Intraoperative Expected Acute Blood Loss Anemia  - Acute Blood Loss Anemia  - Other condition (please document in the progress notes and/or discharge summary)  - Cannot Clinically determine at this time      additional documentation in chart upon review. SM    Thank You,  Saul Fordyce  Clinical Documentation Specialist: Cell [phone]  Health Information Management [place]

## 2012-09-21 NOTE — Clinical Social Work Note (Signed)
Clinical Social Worker received referral for possible ST-SNF placement.  Chart reviewed.  PT/OT scheduled to work with patient today.  Spoke with RN Case Manager who will follow up with patient to discuss home health needs if appropriate.    CSW signing off - please re consult if social work needs arise.  Macario Golds, [place] [phone]

## 2012-09-21 NOTE — Progress Notes (Signed)
  Johnston, Jessica Brynn   OTR/L Pager: [phone] Office: [phone] .  

## 2012-09-22 LAB — CBC
HCT: 25.9 % — ABNORMAL LOW (ref 36.0–46.0)
Hemoglobin: 9.1 g/dL — ABNORMAL LOW (ref 12.0–15.0)
MCV: 83.3 fL (ref 78.0–100.0)
RBC: 3.11 MIL/uL — ABNORMAL LOW (ref 3.87–5.11)
RDW: 13.3 % (ref 11.5–15.5)
WBC: 10.9 10*3/uL — ABNORMAL HIGH (ref 4.0–10.5)

## 2012-09-22 MED ORDER — SODIUM CHLORIDE 0.9 % IV BOLUS (SEPSIS)
500.0000 mL | Freq: Once | INTRAVENOUS | Status: AC
  Administered 2012-09-22: 500 mL via INTRAVENOUS

## 2012-09-22 MED ORDER — METHOCARBAMOL 500 MG PO TABS
500.0000 mg | ORAL_TABLET | Freq: Three times a day (TID) | ORAL | Status: DC | PRN
  Administered 2012-09-22 – 2012-09-27 (×5): 500 mg via ORAL
  Filled 2012-09-22 (×5): qty 1

## 2012-09-22 MED ORDER — HYDROMORPHONE HCL 2 MG PO TABS
2.0000 mg | ORAL_TABLET | ORAL | Status: DC | PRN
  Administered 2012-09-22 – 2012-09-27 (×30): 2 mg via ORAL
  Filled 2012-09-22 (×7): qty 1
  Filled 2012-09-22: qty 2
  Filled 2012-09-22 (×23): qty 1

## 2012-09-22 MED ORDER — ALUM & MAG HYDROXIDE-SIMETH 200-200-20 MG/5ML PO SUSP
15.0000 mL | Freq: Four times a day (QID) | ORAL | Status: DC | PRN
  Filled 2012-09-22 (×2): qty 30

## 2012-09-22 NOTE — Evaluation (Signed)
Occupational Therapy Evaluation Patient Details Name: [name] [name] MRN: [id number] DOB: [birth year] Today's Date: 09/22/2012 Time: 4098-1191 OT Time Calculation (min): 23 min  OT Assessment / Plan / Recommendation Clinical Impression  68 yo female s/p /p anterior lateral lumbar fusion L1-S1. Ot to follow acutely. Recommend HHOT pending progress. Pt currently limited by dizziness and nausea.    OT Assessment  Patient needs continued OT Services    Follow Up Recommendations  Home health OT    Barriers to Discharge      Equipment Recommendations  None recommended by OT    Recommendations for Other Services    Frequency  Min 2X/week    Precautions / Restrictions Precautions Precautions: Back Precaution Booklet Issued: No Required Braces or Orthoses: Spinal Brace Spinal Brace: Applied in supine position   Pertinent Vitals/Pain Dizziness and nausea RN Tammy provided  medication    ADL  Eating/Feeding: Set up Where Assessed - Eating/Feeding: Chair Grooming: Wash/dry face;Teeth care;Set up Where Assessed - Grooming: Supported sitting Lower Body Dressing: +1 Total assistance Where Assessed - Lower Body Dressing: Supported sitting Toilet Transfer: Moderate assistance Toilet Transfer Method: Sit to stand Toilet Transfer Equipment: Raised toilet seat with arms (or 3-in-1 over toilet) (required total (A) for hygiene) Equipment Used: Back brace Transfers/Ambulation Related to ADLs: Pt reports feeling nauseated and dizzy so stand pivot to chair attempted right side ADL Comments: pt completed 3n1 transfer and requesting return to supine. Pt BP monitored and stable . pt positioned with pillows in recliner due to low BP just in case. Pt encouarged to sit up for 1 hour. SCD applied and pt encouraged to complete ankle pumps    OT Diagnosis: Generalized weakness;Acute pain  OT Problem List: Decreased strength;Decreased activity tolerance;Impaired balance (sitting and/or  standing);Decreased safety awareness;Decreased knowledge of use of DME or AE;Decreased knowledge of precautions;Pain OT Treatment Interventions: Self-care/ADL training;DME and/or AE instruction;Therapeutic activities;Patient/family education;Balance training   OT Goals Acute Rehab OT Goals OT Goal Formulation: With patient/family Time For Goal Achievement: 10/06/12 Potential to Achieve Goals: Good ADL Goals Pt Will Perform Lower Body Bathing: with set-up;Sit to stand from chair;Supported;with adaptive equipment ADL Goal: Lower Body Bathing - Progress: Goal set today Pt Will Perform Lower Body Dressing: with set-up;Sit to stand from chair;Supported;with adaptive equipment ADL Goal: Lower Body Dressing - Progress: Goal set today Pt Will Transfer to Toilet: with set-up;Ambulation;3-in-1;Maintaining back safety precautions ADL Goal: Toilet Transfer - Progress: Goal set today Pt Will Perform Toileting - Clothing Manipulation: with set-up;Sitting on 3-in-1 or toilet ADL Goal: Toileting - Clothing Manipulation - Progress: Goal set today Pt Will Perform Toileting - Hygiene: with set-up;Sit to stand from 3-in-1/toilet ADL Goal: Toileting - Hygiene - Progress: Goal set today Miscellaneous OT Goals Miscellaneous OT Goal #1: Pt will complete bed mobility Supervision level as precursor to don brace OT Goal: Miscellaneous Goal #1 - Progress: Goal set today Miscellaneous OT Goal #2: Pt will don /doff brace Mod I with family supine OT Goal: Miscellaneous Goal #2 - Progress: Goal set today  Visit Information  Last OT Received On: 09/22/12 Assistance Needed: +1    Subjective Data  Subjective: "When can I lay down" Patient Stated Goal: to return to supine   Prior Functioning     Home Living Lives With: Spouse Available Help at Discharge: Available 24 hours/day;Family Type of Home: House Home Access: Stairs to enter [employer] of Steps: 4 Entrance Stairs-Rails: Right;Left Home  Layout: One level Bathroom Shower/Tub: Forensic scientist: Handicapped height Bathroom  Accessibility: No Home Adaptive Equipment: Shower chair without back;Walker - rolling;Wheelchair - manual Prior Function Level of Independence: Independent Able to Take Stairs?: Yes Driving: Yes 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 Pt unable to recall arching.     Extremity/Trunk Assessment Right Upper Extremity Assessment RUE ROM/Strength/Tone: Due to precautions;WFL for tasks assessed RUE Coordination: WFL - gross motor Left Upper Extremity Assessment LUE ROM/Strength/Tone: WFL for tasks assessed;Due to precautions LUE Coordination: WFL - gross motor (hx of carpal tunnel) Trunk Assessment Trunk Assessment: Normal     Mobility Bed Mobility Bed Mobility: Not assessed (completed with x2 RN staff) Transfers Transfers: Sit to Stand;Stand to Sit Sit to Stand: 3: Mod assist;With upper extremity assist;From bed Stand to Sit: 3: Mod assist;With upper extremity assist;To chair/3-in-1 Details for Transfer Assistance: mod v/c for hand placement and safety     Exercise     Balance Static Sitting Balance Static Sitting - Balance Support: Bilateral upper extremity supported;Feet supported Static Sitting - Level of Assistance: 4: Min assist Static Sitting - Comment/# of Minutes: ~pt reports "I feel like i am going to pass out"  BP obatined and stable. Pt requires  BIL UE for static sitting   End of Session OT - End of Session Activity Tolerance: Patient limited by pain Patient left: in chair;with call bell/phone within reach;with family/visitor present Nurse Communication: Mobility status;Precautions  GO     Lucile Shutters 09/22/2012, 10:10 AM Pager: [phone]

## 2012-09-22 NOTE — Progress Notes (Signed)
Pt  a85 year old white femal a transfer from 3100 this afternoon with 2 days post  Level  4 lumbar fusion,  Alert awake and oriented x4  MAE but with  Generalized weakness.  Initial assessment done ,noted pt abdomen slightly distended. LMB 09/20/12 however pt denied constipation  an stated it s baseline  For her to have BM only twice a week... BS ctive ,denied passing gas. Ambulation encourage & ill offer dulcolax suppository as needed.  No acute distress noted. Will continue to monitor.

## 2012-09-22 NOTE — Progress Notes (Signed)
Subjective: Patient reports doing better in terms of pain  Objective: Vital signs in last 24 hours: Temp:  [98.4 F (36.9 C)-99 F (37.2 C)] 99 F (37.2 C) (03/08 0342) Pulse Rate:  [82-109] 96 (03/08 0700) Resp:  [16-25] 20 (03/08 0700) BP: (82-131)/(38-91) 87/44 mmHg (03/08 0700) SpO2:  [88 %-100 %] 96 % (03/08 0700) Arterial Line BP: (68-79)/(53-62) 79/62 mmHg (03/07 1000)  Intake/Output from previous day: 03/07 0701 - 03/08 0700 In: 4680 [P.O.:480; I.V.:2900; IV Piggyback:1300] Out: 1825 [Urine:1825] Intake/Output this shift:    Physical Exam: Full strength both legs.  Dressings CDI.  No numbness.  Lab Results:  Recent Labs  09/21/12 1105 09/22/12 0424  WBC 8.6 10.9*  HGB 9.4* 9.1*  HCT 26.8* 25.9*  PLT 190 194   BMET  Recent Labs  09/20/12 1703 09/21/12 0520 09/21/12 2031 09/22/12 0655  NA 136 134*  --   --   K 2.4* 3.0* 3.2* 3.4*  CL  --  98  --   --   CO2  --  29  --   --   GLUCOSE 188* 138*  --   --   BUN  --  9  --   --   CREATININE  --  0.58  --   --   CALCIUM  --  8.2*  --   --     Studies/Results: Dg Lumbar Spine Complete  09/20/2012  *RADIOLOGY REPORT*  Clinical Data:  DG C-ARM GT 120 MIN,LUMBAR SPINE -  4+ VIEW  Comparison: 06/20/2012  Findings: Multiple intraoperative six intraoperative spot images are submitted demonstrating changes of  posterior lumbar fusion. The superior level appears to be at L1.  The inferior level is difficult to determine on these intraoperative spot images, likely S1.  Normal alignment.  No hardware or bony complicating feature.  IMPRESSION: Posterior lumbar fusion changes as above.   Original Report Authenticated By: Charlett Nose, M.D.    Dg Lumbar Spine 1 View  09/20/2012  *RADIOLOGY REPORT*  Clinical Data: Lumbar fusion.  LUMBAR SPINE - 1 VIEW  Comparison: 06/20/2012  Findings: Posterior surgical instruments are noted directed at the L5 and S1 levels.  IMPRESSION: Intraoperative localization as above.   Original  Report Authenticated By: Charlett Nose, M.D.    Dg C-arm Gt 120 Min  09/20/2012  *RADIOLOGY REPORT*  Clinical Data:  DG C-ARM GT 120 MIN,LUMBAR SPINE -  4+ VIEW  Comparison: 06/20/2012  Findings: Multiple intraoperative six intraoperative spot images are submitted demonstrating changes of  posterior lumbar fusion. The superior level appears to be at L1.  The inferior level is difficult to determine on these intraoperative spot images, likely S1.  Normal alignment.  No hardware or bony complicating feature.  IMPRESSION: Posterior lumbar fusion changes as above.   Original Report Authenticated By: Charlett Nose, M.D.     Assessment/Plan: BP improving, no dizziness or tachycardia.  Will start po dilaudid for pain.  Tx to floor.  Continue PT.    LOS: 2 days    Espyn Radwan D, MD 09/22/2012, 8:01 AM

## 2012-09-22 NOTE — Progress Notes (Signed)
Patient experiencing hypotension, yet asymptomatic and producing urine. Dr. Venetia Maxon notified and 500 cc bolus of NS ordered.  Will continue to monitor. Jacqulynn Cadet

## 2012-09-22 NOTE — Progress Notes (Signed)
Patient's blood pressure improved slightly, then hypotensive again with blood pressure 88/44.  Dr. Venetia Maxon paged and made aware. 500 cc bolus of NS ordered.  Will continue to monitor. [name] [name]

## 2012-09-22 NOTE — Progress Notes (Signed)
Physical Therapy Treatment Patient Details Name: [name] [name] MRN: [id number] DOB: 1946-05-15 Today's Date: 09/22/2012 Time: 1012-1040 PT Time Calculation (min): 28 min  PT Assessment / Plan / Recommendation Comments on Treatment Session  67 yo s/p anterolateral L1-S1 fusion with continued need for PT for education in safe use of DME and mobilizing while adhering to back precautions.    Follow Up Recommendations  Home health PT;Supervision for mobility/OOB     Does the patient have the potential to tolerate intense rehabilitation     Barriers to Discharge        Equipment Recommendations  None recommended by PT    Recommendations for Other Services    Frequency Min 5X/week   Plan Discharge plan remains appropriate;Frequency remains appropriate    Precautions / Restrictions Precautions Precautions: Back Precaution Booklet Issued: No Precaution Comments: recalled 3/3 back precautions; maintained with min cues Required Braces or Orthoses: Spinal Brace Spinal Brace: Applied in supine position   Pertinent Vitals/Pain 6/10 back pain after ambulation; pt requesting muscle relaxer or pain medicine--RN notified HR 101-121 with activity    Mobility  Bed Mobility Bed Mobility: Sit to Sidelying Left;Rolling Right Rolling Right: 4: Min assist Sit to Sidelying Left: 4: Min assist;HOB flat Details for Bed Mobility Assistance: pt prefers to enter bed on Lt side due to incision on her Rt side; min assist for legs Transfers Transfers: Sit to Stand;Stand to Sit Sit to Stand: With upper extremity assist;From chair/3-in-1;From toilet;3: Mod assist Stand to Sit: 4: Min assist;To bed;To toilet Details for Transfer Assistance: vc for safe use of RW; asssit to weight-shift over BOS; Ambulation/Gait Ambulation/Gait Assistance: 4: Min assist (+2 for lines) Ambulation Distance (Feet): 20 Feet (8, 12 ft seated rest) Assistive device: Rolling walker Ambulation/Gait Assistance Details: vc and  assist to maneuver RW safely; keep RW closer to her to prevent flexion Gait Pattern: Step-through pattern;Decreased stride length    Exercises     PT Diagnosis:    PT Problem List:   PT Treatment Interventions:     PT Goals Acute Rehab PT Goals Pt will Roll Supine to Right Side: with modified independence PT Goal: Rolling Supine to Right Side - Progress: Progressing toward goal Pt will go Sit to Supine/Side: with min assist;with HOB 0 degrees PT Goal: Sit to Supine/Side - Progress: Progressing toward goal Pt will go Sit to Stand: with supervision PT Goal: Sit to Stand - Progress: Progressing toward goal Pt will go Stand to Sit: with supervision PT Goal: Stand to Sit - Progress: Progressing toward goal Pt will Ambulate: 51 - 150 feet;with supervision;with rolling walker PT Goal: Ambulate - Progress: Progressing toward goal Additional Goals Additional Goal #1: Pt will independently report and demonstrate 3/3 back precautions during her treatment session PT Goal: Additional Goal #1 - Progress: Progressing toward goal  Visit Information  Last PT Received On: 09/22/12 Assistance Needed: +1    Subjective Data      Cognition  Cognition Overall Cognitive Status: Appears within functional limits for tasks assessed/performed Arousal/Alertness: Awake/alert Orientation Level: Oriented X4 / Intact Behavior During Session: [facility] for tasks performed Cognition - Other Comments: pt recalled 2 out 3  precautions. Pt unable to recall arching.     Balance  Static Sitting Balance Static Sitting - Balance Support: Bilateral upper extremity supported;Feet supported Static Sitting - Level of Assistance: 4: Min assist Static Sitting - Comment/# of Minutes: ~pt reports "I feel like i am going to pass out"  BP obatined and stable. Pt  requires BIL UE for static sitting  End of Session PT - End of Session Equipment Utilized During Treatment: Gait belt;Back brace Activity Tolerance: Patient limited by  pain;Patient limited by fatigue Patient left: in bed;with call bell/phone within reach;with family/visitor present Nurse Communication: Patient requests pain meds   GP     SASSER,LYNN 09/22/2012, 11:48 AM

## 2012-09-23 MED ORDER — SODIUM CHLORIDE 0.9 % IV BOLUS (SEPSIS)
500.0000 mL | Freq: Once | INTRAVENOUS | Status: AC
  Administered 2012-09-23: 500 mL via INTRAVENOUS

## 2012-09-23 MED ORDER — DEXAMETHASONE SODIUM PHOSPHATE 4 MG/ML IJ SOLN
4.0000 mg | Freq: Three times a day (TID) | INTRAMUSCULAR | Status: AC
  Administered 2012-09-23 – 2012-09-25 (×6): 4 mg via INTRAVENOUS
  Filled 2012-09-23 (×8): qty 1

## 2012-09-23 MED ORDER — BISACODYL 10 MG RE SUPP
10.0000 mg | Freq: Every day | RECTAL | Status: DC | PRN
  Administered 2012-09-24: 10 mg via RECTAL
  Filled 2012-09-23: qty 1

## 2012-09-23 MED ORDER — GABAPENTIN 100 MG PO CAPS
200.0000 mg | ORAL_CAPSULE | Freq: Three times a day (TID) | ORAL | Status: DC
  Administered 2012-09-23 – 2012-09-27 (×13): 200 mg via ORAL
  Filled 2012-09-23 (×15): qty 2

## 2012-09-23 NOTE — Progress Notes (Signed)
Occupational Therapy Treatment Patient Details Name: [name] [name] MRN: [id number] DOB: 1945-12-01 Today's Date: 09/23/2012 Time: 4098-1191 OT Time Calculation (min): 26 min  OT Assessment / Plan / Recommendation Comments on Treatment Session Pt making slow progress. Pt requires increased time for all ADL and mobility tasks.    Follow Up Recommendations  Home health OT    Barriers to Discharge       Equipment Recommendations  None recommended by OT    Recommendations for Other Services    Frequency Min 2X/week   Plan      Precautions / Restrictions Precautions Precautions: Back Precaution Comments: recalled 3/3 back precautions; maintained with min cues Required Braces or Orthoses: Spinal Brace Spinal Brace: Applied in supine position   Pertinent Vitals/Pain See vitals    ADL  Eating/Feeding: Performed;Set up Where Assessed - Eating/Feeding: Chair Toilet Transfer: Performed;Moderate assistance Toilet Transfer Method: Stand pivot Toilet Transfer Equipment: Bedside commode Toileting - Clothing Manipulation and Hygiene: Performed;Minimal assistance (front peri hygiene) Where Assessed - Toileting Clothing Manipulation and Hygiene: Standing Equipment Used: Back brace;Rolling walker Transfers/Ambulation Related to ADLs: Min-mod assist for SPT from bed to chair and then to ambulate through room with RW.  Second person helpful for chair follow as pt fatigued quickly. ADL Comments: Mod assist for fastening brace in supine. Pt requires max encouragement and increased time to complete all tasks.    OT Diagnosis:    OT Problem List:   OT Treatment Interventions:     OT Goals ADL Goals Pt Will Transfer to Toilet: with set-up;Ambulation;3-in-1;Maintaining back safety precautions ADL Goal: Toilet Transfer - Progress: Progressing toward goals Pt Will Perform Toileting - Clothing Manipulation: with set-up;Sitting on 3-in-1 or toilet ADL Goal: Toileting - Clothing Manipulation -  Progress: Progressing toward goals Pt Will Perform Toileting - Hygiene: with set-up;Sit to stand from 3-in-1/toilet ADL Goal: Toileting - Hygiene - Progress: Progressing toward goals Miscellaneous OT Goals Miscellaneous OT Goal #1: Pt will complete bed mobility Supervision level as precursor to don brace OT Goal: Miscellaneous Goal #1 - Progress: Progressing toward goals Miscellaneous OT Goal #2: Pt will don /doff brace Mod I with family supine OT Goal: Miscellaneous Goal #2 - Progress: Progressing toward goals  Visit Information  Last OT Received On: 09/23/12 Assistance Needed: +1 PT/OT Co-Evaluation/Treatment: Yes    Subjective Data      Prior Functioning       Cognition  Cognition Overall Cognitive Status: Appears within functional limits for tasks assessed/performed Arousal/Alertness: Lethargic Orientation Level: Appears intact for tasks assessed Behavior During Session: [facility] for tasks performed    Mobility  Bed Mobility Bed Mobility: Rolling Left;Left Sidelying to Sit;Sitting - Scoot to Edge of Bed Rolling Left: With rail;4: Min assist Left Sidelying to Sit: 3: Mod assist;HOB flat;With rails Details for Bed Mobility Assistance: (A) to manage LE's, & to lift torso to sitting upright.  Cues for sequencing & Technique.  Transfers Transfers: Sit to Stand;Stand to Sit Sit to Stand: 3: Mod assist;With upper extremity assist;With armrests;From chair/3-in-1;From bed Stand to Sit: 4: Min assist;With upper extremity assist;With armrests;To chair/3-in-1 Details for Transfer Assistance: Cues for hand placement & technique.  (A) to achieve standing, balance, minor RW management during pivot.     Exercises      Balance     End of Session OT - End of Session Equipment Utilized During Treatment: Back brace Activity Tolerance: Patient limited by fatigue;Patient limited by pain Patient left: in chair;with call bell/phone within reach;with family/visitor present Nurse Communication:  Patient requests pain meds  GO    09/23/2012 Cipriano Mile OTR/L Pager [phone] Office [phone]  Cipriano Mile 09/23/2012, 2:02 PM

## 2012-09-23 NOTE — Progress Notes (Signed)
Subjective: Patient reports having pain in right hip  Objective: Vital signs in last 24 hours: Temp:  [97.6 F (36.4 C)-98.9 F (37.2 C)] 98.8 F (37.1 C) (03/09 0325) Pulse Rate:  [92-103] 103 (03/09 0325) Resp:  [18-25] 18 (03/09 0325) BP: (85-107)/(43-65) 93/52 mmHg (03/09 0325) SpO2:  [91 %-97 %] 95 % (03/09 0325)  Intake/Output from previous day: 03/08 0701 - 03/09 0700 In: 1120 [P.O.:520; I.V.:600] Out: 700 [Urine:700] Intake/Output this shift:    Physical Exam: Full strength both lower extremities.  Dressings CDI. Abdominal distension, non tender.  Lab Results:  Recent Labs  09/21/12 1105 09/22/12 0424  WBC 8.6 10.9*  HGB 9.4* 9.1*  HCT 26.8* 25.9*  PLT 190 194   BMET  Recent Labs  09/20/12 1703 09/21/12 0520 09/21/12 2031 09/22/12 0655  NA 136 134*  --   --   K 2.4* 3.0* 3.2* 3.4*  CL  --  98  --   --   CO2  --  29  --   --   GLUCOSE 188* 138*  --   --   BUN  --  9  --   --   CREATININE  --  0.58  --   --   CALCIUM  --  8.2*  --   --     Studies/Results: No results found.  Assessment/Plan: Mobilize with PT.  Dulcolax suppository.  Neurontin, decadron taper, fluid bolus for low BP.    LOS: 3 days    Dorian Heckle, MD 09/23/2012, 5:30 AM

## 2012-09-23 NOTE — Progress Notes (Signed)
Physical Therapy Treatment Patient Details Name: [name] [name] MRN: [id number] DOB: October 26, 1945 Today's Date: 09/23/2012 Time: 0101-0128 PT Time Calculation (min): 27 min  PT Assessment / Plan / Recommendation Comments on Treatment Session  Pt progressing slowly with mobility at this date.  Pt reports pain limiting progression.      Follow Up Recommendations  Home health PT;Supervision for mobility/OOB     Does the patient have the potential to tolerate intense rehabilitation     Barriers to Discharge        Equipment Recommendations  None recommended by PT    Recommendations for Other Services    Frequency Min 5X/week   Plan Discharge plan remains appropriate;Frequency remains appropriate    Precautions / Restrictions Precautions Precautions: Back Precaution Comments: recalled 3/3 back precautions; maintained with min cues Required Braces or Orthoses: Spinal Brace Spinal Brace: Applied in supine position   Pertinent Vitals/Pain 10/10 Rt hip & back at end of session.  RN aware.      Mobility  Bed Mobility Bed Mobility: Rolling Left;Left Sidelying to Sit;Sitting - Scoot to Edge of Bed Rolling Left: With rail;4: Min assist Left Sidelying to Sit: 3: Mod assist;HOB flat;With rails Details for Bed Mobility Assistance: (A) to manage LE's, & to lift torso to sitting upright.  Cues for sequencing & Technique.  Transfers Transfers: Sit to Stand;Stand to [employer] Transfers Sit to Stand: 3: Mod assist;With upper extremity assist;With armrests;From chair/3-in-1;From bed Stand to Sit: 4: Min assist;With upper extremity assist;With armrests;To chair/3-in-1 Stand Pivot Transfers: 4: Min guard Details for Transfer Assistance: Cues for hand placement & technique.  (A) to achieve standing, balance, minor RW management during pivot.  Ambulation/Gait Ambulation/Gait Assistance: 4: Min guard Ambulation Distance (Feet): 15 Feet Assistive device: Rolling walker Ambulation/Gait  Assistance Details: Increased time & effortful.  Pt with difficulty picking Rt foot up off floor due to pain in Rt hip per pt.   Gait Pattern: Step-to pattern;Decreased step length - left;Decreased step length - right;Decreased hip/knee flexion - right     PT Goals Acute Rehab PT Goals Time For Goal Achievement: 09/28/12 Potential to Achieve Goals: Good Pt will Roll Supine to Right Side: with modified independence Pt will Roll Supine to Left Side: with modified independence PT Goal: Rolling Supine to Left Side - Progress: Not met Pt will go Supine/Side to Sit: with modified independence PT Goal: Supine/Side to Sit - Progress: Not met Pt will go Sit to Supine/Side: with min assist;with HOB 0 degrees Pt will go Sit to Stand: with supervision PT Goal: Sit to Stand - Progress: Not met Pt will go Stand to Sit: with supervision PT Goal: Stand to Sit - Progress: Not met Pt will Transfer Bed to Chair/Chair to Bed: with supervision PT Transfer Goal: Bed to Chair/Chair to Bed - Progress: Progressing toward goal Pt will Ambulate: 51 - 150 feet;with supervision;with rolling walker PT Goal: Ambulate - Progress: Not met Pt will Go Up / Down Stairs: 3-5 stairs;with rail(s);with min assist Additional Goals Additional Goal #1: Pt will independently report and demonstrate 3/3 back precautions during her treatment session PT Goal: Additional Goal #1 - Progress: Progressing toward goal  Visit Information  Last PT Received On: 09/23/12 Assistance Needed: +1    Subjective Data      Cognition  Cognition Overall Cognitive Status: Appears within functional limits for tasks assessed/performed Arousal/Alertness: Lethargic Orientation Level: Appears intact for tasks assessed Behavior During Session: [facility] for tasks performed    Balance  End of Session PT - End of Session Equipment Utilized During Treatment: Gait belt;Back brace Activity Tolerance: Patient limited by pain Patient left: in  chair;with call bell/phone within reach;with family/visitor present Nurse Communication: Mobility status    Verdell Face, [place] [phone] 09/23/2012

## 2012-09-24 ENCOUNTER — Encounter (HOSPITAL_COMMUNITY): Payer: Self-pay | Admitting: Neurosurgery

## 2012-09-24 MED ORDER — FLEET ENEMA 7-19 GM/118ML RE ENEM
1.0000 | ENEMA | Freq: Once | RECTAL | Status: AC
  Administered 2012-09-24: 1 via RECTAL

## 2012-09-24 NOTE — Progress Notes (Signed)
Physical Therapy Treatment Patient Details Name: [name] [name] MRN: [id number] DOB: Jan 16, 1946 Today's Date: 09/24/2012 Time: 6644-0347 PT Time Calculation (min): 30 min  PT Assessment / Plan / Recommendation Comments on Treatment Session  Pt much improved today with transfers and gait.    Follow Up Recommendations  Home health PT                 Frequency Min 5X/week   Plan Discharge plan remains appropriate;Frequency remains appropriate    Precautions / Restrictions Precautions Precautions: Back Required Braces or Orthoses: Spinal Brace Spinal Brace: Applied in supine position Restrictions Weight Bearing Restrictions: No   Pertinent Vitals/Pain Pt c/o improving back pain, RN made aware.    Mobility  Bed Mobility Rolling Left: 4: Min assist Left Sidelying to Sit: 4: Min assist Sitting - Scoot to Edge of Bed: 5: Supervision Details for Bed Mobility Assistance: A for trunk due to weakness Transfers Sit to Stand: 5: Supervision Stand to Sit: 5: Supervision;With armrests;To chair/3-in-1;To toilet Details for Transfer Assistance: Pt able to stand from bed and raised toilet with supervision.  Discussed safe seating options for home as well as car transfers with pt/husband who both expressed understanding. Ambulation/Gait Ambulation/Gait Assistance: 5: Supervision Ambulation Distance (Feet): 50 Feet Assistive device: Rolling walker Ambulation/Gait Assistance Details: Pt able to increase distance today.  continues with slow cadence but able to pick R foot up today.    Exercises Other Exercises Other Exercises: Bathroom mobility with RW with supervision, standing at sink x 7 minutes for grooming tasks with supervision without rest.  much improved activity tolerance today.     PT Goals Acute Rehab PT Goals PT Goal: Rolling Supine to Right Side - Progress: Progressing toward goal PT Goal: Rolling Supine to Left Side - Progress: Progressing toward goal PT Goal:  Supine/Side to Sit - Progress: Progressing toward goal PT Goal: Sit to Supine/Side - Progress: Progressing toward goal PT Goal: Sit to Stand - Progress: Met PT Goal: Stand to Sit - Progress: Met PT Transfer Goal: Bed to Chair/Chair to Bed - Progress: Met PT Goal: Ambulate - Progress: Progressing toward goal Additional Goals PT Goal: Additional Goal #1 - Progress: Progressing toward goal  Visit Information  Last PT Received On: 09/24/12 Assistance Needed: +1    Subjective Data  Subjective: Pt reports she feels "much better" than yesterday Patient Stated Goal: Go home   Cognition  Cognition Overall Cognitive Status: Appears within functional limits for tasks assessed/performed       End of Session PT - End of Session Equipment Utilized During Treatment: Gait belt;Back brace Activity Tolerance: Patient tolerated treatment well Patient left: in chair;with family/visitor present;with call bell/phone within reach Nurse Communication: Mobility status      DONAWERTH,KAREN 09/24/2012, 12:26 PM

## 2012-09-24 NOTE — Progress Notes (Signed)
Occupational Therapy Treatment Patient Details Name: [name] [name] MRN: [id number] DOB: 03-26-1946 Today's Date: 09/24/2012 Time: 1230-1310 OT Time Calculation (min): 40 min  OT Assessment / Plan / Recommendation Comments on Treatment Session Pt continues with steady progress.  Pain 7/10 with activity.       Follow Up Recommendations  Home health OT    Barriers to Discharge       Equipment Recommendations  None recommended by OT    Recommendations for Other Services    Frequency Min 2X/week   Plan Discharge plan remains appropriate    Precautions / Restrictions Precautions Precautions: Back Precaution Comments: Pt able to recall 3/3 back precautions independently Required Braces or Orthoses: Spinal Brace Spinal Brace: Applied in supine position Restrictions Weight Bearing Restrictions: No   Pertinent Vitals/Pain     ADL  Grooming: Wash/dry hands;Minimal assistance Where Assessed - Grooming: Supported sitting Lower Body Bathing: Minimal assistance Where Assessed - Lower Body Bathing: Supported sit to stand (with AE) Lower Body Dressing: Minimal assistance Where Assessed - Lower Body Dressing: Supported sit to Pharmacist, hospital: Minimal Dentist Method: Sit to stand;Stand pivot Acupuncturist: Raised toilet seat with arms (or 3-in-1 over toilet) Toileting - Clothing Manipulation and Hygiene: Min guard Where Assessed - Glass blower/designer Manipulation and Hygiene: Standing Equipment Used: Back brace;Rolling walker Transfers/Ambulation Related to ADLs: Min A to min guard assist for ambulation ADL Comments: Pt and family instructed in use and acquisition of AE.  Pt able to perform LB ADLs with AE and min A.  Pt. moves slowly and requires encouragement at times.  Pain 7/10 - RN notified and pain meds provided.  Pt. reports feeling dyspneic )2 sats 98% on RA and HR 81    OT Diagnosis:    OT Problem List:   OT Treatment Interventions:      OT Goals ADL Goals ADL Goal: Lower Body Bathing - Progress: Progressing toward goals ADL Goal: Lower Body Dressing - Progress: Progressing toward goals ADL Goal: Toilet Transfer - Progress: Progressing toward goals ADL Goal: Toileting - Clothing Manipulation - Progress: Progressing toward goals ADL Goal: Toileting - Hygiene - Progress: Progressing toward goals  Visit Information  Last OT Received On: 09/24/12 Assistance Needed: +1    Subjective Data      Prior Functioning       Cognition  Cognition Overall Cognitive Status: Appears within functional limits for tasks assessed/performed Arousal/Alertness: Awake/alert Orientation Level: Appears intact for tasks assessed Behavior During Session: [facility] for tasks performed    Mobility  Bed Mobility Rolling Left: 4: Min assist Left Sidelying to Sit: 4: Min assist Sitting - Scoot to Edge of Bed: 5: Supervision Details for Bed Mobility Assistance: A for trunk due to weakness Transfers Transfers: Sit to Stand;Stand to Sit Sit to Stand: 4: Min guard;From chair/3-in-1;With upper extremity assist Stand to Sit: 4: Min guard;With upper extremity assist;To chair/3-in-1 Details for Transfer Assistance: Pt able to stand from bed and raised toilet with supervision.  Discussed safe seating options for home as well as car transfers with pt/husband who both expressed understanding.    Exercises  Other Exercises Other Exercises: Bathroom mobility with RW with supervision, standing at sink x 7 minutes for grooming tasks with supervision without rest.  much improved activity tolerance today.   Balance     End of Session OT - End of Session Equipment Utilized During Treatment: Back brace Activity Tolerance: Patient tolerated treatment well Patient left: in chair;with call bell/phone within reach;with  family/visitor present Nurse Communication: Patient requests pain meds  GO     Conarpe, Ursula Alert M 09/24/2012, 1:20 PM

## 2012-09-24 NOTE — Progress Notes (Signed)
Subjective: Patient reports "I'm doing better. I'm smiling today!"  Objective: Vital signs in last 24 hours: Temp:  [97.6 F (36.4 C)-99.1 F (37.3 C)] 98 F (36.7 C) (03/10 0930) Pulse Rate:  [72-88] 85 (03/10 0930) Resp:  [17-20] 17 (03/10 0930) BP: (104-116)/(50-57) 108/50 mmHg (03/10 0930) SpO2:  [93 %-98 %] 98 % (03/10 0930)  Intake/Output from previous day: 03/09 0701 - 03/10 0700 In: 60 [P.O.:60] Out: -  Intake/Output this shift:    Alert, conversant. Good strength BLE. Hoping to walk in hallway today.  Drsg removed: incisions with Dermabond. No erythema, swelling, or drainage.   Lab Results:  Recent Labs  09/21/12 1105 09/22/12 0424  WBC 8.6 10.9*  HGB 9.4* 9.1*  HCT 26.8* 25.9*  PLT 190 194   BMET  Recent Labs  09/22/12 0655 09/23/12 0725  K 3.4* 3.4*    Studies/Results: No results found.  Assessment/Plan: Improving   LOS: 4 days  Continue to mobilize in LSO.   Georgiann Cocker 09/24/2012, 9:54 AM

## 2012-09-24 NOTE — Progress Notes (Signed)
Patient making good progress. Continue with PT.

## 2012-09-25 MED FILL — Sodium Chloride IV Soln 0.9%: INTRAVENOUS | Qty: 1000 | Status: AC

## 2012-09-25 MED FILL — Sodium Chloride Irrigation Soln 0.9%: Qty: 3000 | Status: AC

## 2012-09-25 MED FILL — Heparin Sodium (Porcine) Inj 1000 Unit/ML: INTRAMUSCULAR | Qty: 30 | Status: AC

## 2012-09-25 NOTE — Progress Notes (Signed)
Continue to mobilize with PT. 

## 2012-09-25 NOTE — Progress Notes (Signed)
   CARE MANAGEMENT NOTE 09/25/2012  Patient:  DANIQUA, CAMPOY   Account Number:  [id number]  Date Initiated:  09/24/2012  Documentation initiated by:  [facility]  Subjective/Objective Assessment:   admitted postop anterior lumbar fusion L1-L5     Action/Plan:   PT/OT evals-recommending HHPT and HHOT   Anticipated DC Date:  09/25/2012   Anticipated DC Plan:  HOME W HOME HEALTH SERVICES      DC Planning Services  CM consult      [facility] Choice  HOME HEALTH   Choice offered to / List presented to:  C-1 Patient        HH arranged  HH-2 PT  HH-3 OT      [facility] agency  [facility]   Status of service:  Completed, signed off Medicare Important Message given?   (If response is "NO", the following Medicare IM given date fields will be blank) Date Medicare IM given:   Date Additional Medicare IM given:    Discharge Disposition:  HOME W HOME HEALTH SERVICES  Per UR Regulation:  Reviewed for med. necessity/level of care/duration of stay  If discussed at Long Length of Stay Meetings, dates discussed:    Comments:  09/25/2012 1715 NCM spoke to pt and provided [facility] list. Requested [facility] for [facility]. Pt states she has RW and shower chair at home. Her husband is putting in a elevated toilet seat. Notified [facility] rep of referral. [facility] added to dc instructions. Isidoro Donning RN CCM Case Mgmt phone [phone]

## 2012-09-25 NOTE — Progress Notes (Signed)
Physical Therapy Treatment Patient Details Name: [name] [name] MRN: [id number] DOB: May 04, 1946 Today's Date: 09/25/2012 Time: 1000-1038 PT Time Calculation (min): 38 min  PT Assessment / Plan / Recommendation Comments on Treatment Session  Pt reprots she is hurting more today and feeling weak.  Nursing is aware.  Pt did not feel strong enough to attempt steps today.  pt reviewed brace, back precautions with ADls and basic mobilty and positioning.      Follow Up Recommendations  Home health PT     Does the patient have the potential to tolerate intense rehabilitation     Barriers to Discharge        Equipment Recommendations  None recommended by PT    Recommendations for Other Services    Frequency Min 5X/week   Plan Discharge plan remains appropriate;Frequency remains appropriate    Precautions / Restrictions Precautions Precautions: Back Required Braces or Orthoses: Spinal Brace Spinal Brace: Applied in supine position Restrictions Weight Bearing Restrictions: No Other Position/Activity Restrictions: I had husband don pts brace today.  Reviewed importance of log rolling.   Pertinent Vitals/Pain Pt reports 10/10 back pain.  Nursing aware.    Mobility  Bed Mobility Bed Mobility: Rolling Right;Rolling Left;Supine to Sit Rolling Right: With rail;5: Set up Rolling Left: 5: Set up;With rail Supine to Sit: 4: Min guard Sitting - Scoot to [place] of Bed: 5: Supervision Details for Bed Mobility Assistance: Pt needed cuing for body mechanics and to follow back precautions with rolling Transfers Transfers: Sit to Stand Sit to Stand: 5: Supervision Stand to Sit: 5: Supervision Stand Pivot Transfers: 5: Supervision Details for Transfer Assistance: Pt did toileting and washing hands.  she stood for 8 minutes - reveiwed back precautions with basic ADLs Ambulation/Gait Ambulation/Gait Assistance: 4: Min guard Ambulation Distance (Feet): 30 Feet Assistive device: Rolling  walker Ambulation/Gait Assistance Details: Pt reports yesterday she had a more fluid gait but today she has to stop more frequently due to increased back pain. Gait Pattern: Shuffle    Exercises     PT Diagnosis:    PT Problem List:   PT Treatment Interventions:     PT Goals Acute Rehab PT Goals PT Goal: Rolling Supine to Right Side - Progress: Progressing toward goal PT Goal: Rolling Supine to Left Side - Progress: Progressing toward goal PT Goal: Supine/Side to Sit - Progress: Progressing toward goal PT Goal: Sit to Stand - Progress: Met PT Goal: Stand to Sit - Progress: Met PT Transfer Goal: Bed to Chair/Chair to Bed - Progress: Progressing toward goal PT Goal: Ambulate - Progress: Progressing toward goal PT Goal: Up/Down Stairs - Progress: Not progressing  Visit Information  Last PT Received On: 09/25/12 Assistance Needed: +1 Reason Eval/Treat Not Completed: Pain limiting ability to participate    Subjective Data  Subjective: I just don't feel good today.  I think I overdid it yesterday   Cognition       Balance     End of Session PT - End of Session Equipment Utilized During Treatment: Gait belt;Back brace Activity Tolerance: Patient limited by pain Patient left: in chair;with call bell/phone within reach;with family/visitor present Nurse Communication: Patient requests pain meds   GP     Judson Roch 09/25/2012, 11:08 AM    Ranae Palms, PT

## 2012-09-25 NOTE — Progress Notes (Signed)
Subjective: Patient reports "I don't feel well this morning. My back is hurting more down at the bottom."  Objective: Vital signs in last 24 hours: Temp:  [97.5 F (36.4 C)-98.2 F (36.8 C)] 97.6 F (36.4 C) (03/11 0611) Pulse Rate:  [73-87] 73 (03/11 0611) Resp:  [16-18] 16 (03/11 0611) BP: (105-123)/(48-101) 112/56 mmHg (03/11 0611) SpO2:  [95 %-99 %] 99 % (03/11 0611) Weight:  [71.215 kg (157 lb)] 71.215 kg (157 lb) (03/10 0930)  Intake/Output from previous day: 03/10 0701 - 03/11 0700 In: 1100 [I.V.:1100] Out: -  Intake/Output this shift:    Alert, conversant. Good strength BLE. Incisions with Dermabond. No erythema swelling, or drainage. Pt reports more low back pain, but notes groin & thigh pain have resolved. Good BM yesterday after enema. Voiding without difficulty.  Lab Results: No results found for this basename: WBC, HGB, HCT, PLT,  in the last 72 hours BMET  Recent Labs  09/23/12 0725  K 3.4*    Studies/Results: No results found.  Assessment/Plan: Improving   LOS: 5 days  Continue to mobilize in LSO. Reassured pt re: lumbar pain.    Georgiann Cocker 09/25/2012, 8:27 AM

## 2012-09-26 MED ORDER — SODIUM CHLORIDE 0.9 % IV BOLUS (SEPSIS)
500.0000 mL | Freq: Once | INTRAVENOUS | Status: AC
  Administered 2012-09-26: 500 mL via INTRAVENOUS

## 2012-09-26 NOTE — Progress Notes (Signed)
IV 500 ml bolus given @ 10:15 am.

## 2012-09-26 NOTE — Progress Notes (Signed)
Pt complained about loose BM around 6 times today, probably because she had many meds till yesterday, to resolve her constipation. Did hold all scheduled stool softeners for todaty. Will keep monitoring.  Fuller Canada, RN 09/26/12

## 2012-09-26 NOTE — Progress Notes (Signed)
Subjective: Patient reports light headed this am.  Pain improving  Objective: Vital signs in last 24 hours: Temp:  [97.5 F (36.4 C)-98.3 F (36.8 C)] 98.1 F (36.7 C) (03/12 0500) Pulse Rate:  [69-78] 72 (03/12 0500) Resp:  [16-18] 18 (03/12 0500) BP: (98-112)/(41-58) 98/44 mmHg (03/12 0500) SpO2:  [95 %-100 %] 97 % (03/12 0500)  Intake/Output from previous day:   Intake/Output this shift:    Physical Exam: Dressings CDI.  Up in chair in brace  Lab Results: No results found for this basename: WBC, HGB, HCT, PLT,  in the last 72 hours BMET  Recent Labs  09/23/12 0725  K 3.4*    Studies/Results: No results found.  Assessment/Plan: Continue to mobilize with PT.  Fluid bolus this am.    LOS: 6 days    Dorian Heckle, MD 09/26/2012, 7:17 AM

## 2012-09-26 NOTE — Progress Notes (Signed)
Physical Therapy Treatment Patient Details Name: [name] [name] MRN: [id number] DOB: 11/21/1945 Today's Date: 09/26/2012 Time: 6606-3016 PT Time Calculation (min): 28 min  PT Assessment / Plan / Recommendation Comments on Treatment Session  Pt moving fairly well today despite c/o dizziness.  Spoke with RN prior to PT session & he ok'd to proceed with therapy but with caution.      Follow Up Recommendations  Home health PT     Does the patient have the potential to tolerate intense rehabilitation     Barriers to Discharge        Equipment Recommendations  None recommended by PT    Recommendations for Other Services    Frequency Min 5X/week   Plan Discharge plan remains appropriate;Frequency remains appropriate    Precautions / Restrictions Precautions Precautions: Back Required Braces or Orthoses: Spinal Brace Spinal Brace: Applied in supine position Restrictions Weight Bearing Restrictions: No       Mobility  Bed Mobility Bed Mobility: Not assessed Transfers Transfers: Sit to Stand;Stand to Sit Sit to Stand: 4: Min guard;With upper extremity assist;With armrests;From chair/3-in-1 Stand to Sit: 5: Supervision;With upper extremity assist;With armrests;To chair/3-in-1;Other (comment) (mat table in neuro gym) Details for Transfer Assistance: Pt demonstrated safe technique but more effortful to achieve standing today-- pt reports feeling weaker at this date Ambulation/Gait Ambulation/Gait Assistance: 4: Min guard Ambulation Distance (Feet): 120 Feet Assistive device: Rolling walker Ambulation/Gait Assistance Details: Cues for increased step/stride length & encouragement to increase gait speed if tolerable Gait Pattern: Step-through pattern;Decreased stride length;Decreased step length - right;Decreased step length - left (decreased step height) Gait velocity: decreased General Gait Details: husband followed behind for safety due to pt c/o dizziness today Stairs:  Yes Stairs Assistance: 4: Min assist;4: Min guard Stairs Assistance Details (indicate cue type and reason): Min guard (A) for sideways technique & Min (A) via HHA during forwards using 1 rail technique.   Stair Management Technique: One rail Right;Sideways;Step to pattern;Forwards;Other (comment) (Lt HHA + Rail on Rt during forwards technique) Number of Stairs: 3 (2x's) Wheelchair Mobility Wheelchair Mobility: No      PT Goals Acute Rehab PT Goals Time For Goal Achievement: 09/28/12 Potential to Achieve Goals: Good Pt will Roll Supine to Right Side: with modified independence Pt will Roll Supine to Left Side: with modified independence Pt will go Supine/Side to Sit: with modified independence Pt will go Sit to Supine/Side: with min assist;with HOB 0 degrees Pt will go Sit to Stand: with supervision PT Goal: Sit to Stand - Progress: Not met Pt will go Stand to Sit: with supervision PT Goal: Stand to Sit - Progress: Progressing toward goal Pt will Transfer Bed to Chair/Chair to Bed: with supervision Pt will Ambulate: 51 - 150 feet;with supervision;with rolling walker PT Goal: Ambulate - Progress: Progressing toward goal Pt will Go Up / Down Stairs: 3-5 stairs;with rail(s);with min assist PT Goal: Up/Down Stairs - Progress: Progressing toward goal Additional Goals Additional Goal #1: Pt will independently report and demonstrate 3/3 back precautions during her treatment session PT Goal: Additional Goal #1 - Progress: Progressing toward goal  Visit Information  Last PT Received On: 09/26/12 Assistance Needed: +1    Subjective Data  Subjective: "I feel weaker today"   Cognition  Cognition Overall Cognitive Status: Appears within functional limits for tasks assessed/performed Arousal/Alertness: Awake/alert Orientation Level: Appears intact for tasks assessed Behavior During Session: [facility] for tasks performed    Balance     End of Session PT - End of  Session Equipment Utilized  During Treatment: Gait belt;Back brace Activity Tolerance: Patient tolerated treatment well;Other (comment) (limited by dizziness) Patient left: in chair;with call bell/phone within reach;with family/visitor present Nurse Communication: Mobility status     Verdell Face, [place] [phone] 09/26/2012

## 2012-09-27 LAB — CLOSTRIDIUM DIFFICILE BY PCR: Toxigenic C. Difficile by PCR: NEGATIVE

## 2012-09-27 NOTE — Progress Notes (Signed)
Doing better this afternoon.  Ready to go home.

## 2012-09-27 NOTE — Progress Notes (Signed)
Occupational Therapy Treatment Patient Details Name: [name] [name] MRN: [id number] DOB: 1946-07-16 Today's Date: 09/27/2012 Time: 2841-3244 OT Time Calculation (min): 29 min  OT Assessment / Plan / Recommendation Comments on Treatment Session Making good progress. Answered all questions regarding ADL and mobility for ADL, in addition to donning/doffing back brace and wearing schedule.. Pt will benefit from continued [facility] services after D/C. Pt ready for D/C.    Follow Up Recommendations  Home health OT    Barriers to Discharge   none    Equipment Recommendations  None recommended by OT    Recommendations for Other Services  none  Frequency Min 2X/week   Plan Discharge plan remains appropriate    Precautions / Restrictions Precautions Precautions: Back Required Braces or Orthoses: Spinal Brace   Pertinent Vitals/Pain 3/10 back    ADL  ADL Comments: Pt states that she is using AE for ADL. discussed modifications and compensatory techniques for ADL and IADL tasks. Educated pt/family on appropriate use of DME for bathroom. Husband has 3 in 1 from [place]. Answered all questions. Hand out given.    OT Diagnosis:    OT Problem List:   OT Treatment Interventions:     OT Goals Acute Rehab OT Goals OT Goal Formulation: With patient/family Time For Goal Achievement: 10/06/12 Potential to Achieve Goals: Good ADL Goals Pt Will Perform Lower Body Bathing: with set-up;Sit to stand from chair;Supported;with adaptive equipment ADL Goal: Lower Body Bathing - Progress: Progressing toward goals Pt Will Perform Lower Body Dressing: with set-up;Sit to stand from chair;Supported;with adaptive equipment ADL Goal: Lower Body Dressing - Progress: Progressing toward goals Pt Will Transfer to Toilet: with set-up;Ambulation;3-in-1;Maintaining back safety precautions ADL Goal: Toilet Transfer - Progress: Met Pt Will Perform Toileting - Clothing Manipulation: with set-up;Sitting on 3-in-1 or  toilet ADL Goal: Toileting - Clothing Manipulation - Progress: Progressing toward goals Pt Will Perform Toileting - Hygiene: with set-up;Sit to stand from 3-in-1/toilet ADL Goal: Toileting - Hygiene - Progress: Progressing toward goals Miscellaneous OT Goals Miscellaneous OT Goal #1: Pt will complete bed mobility Supervision level as precursor to don brace OT Goal: Miscellaneous Goal #1 - Progress: Met Miscellaneous OT Goal #2: Pt will don /doff brace Mod I with family supine OT Goal: Miscellaneous Goal #2 - Progress: Met  Visit Information  Last OT Received On: 09/27/12 Assistance Needed: +1                     Mobility  Transfers Details for Transfer Assistance: supervision    Exercises      Balance  WFl   End of Session OT - End of Session Activity Tolerance: Patient tolerated treatment well Patient left: in chair;with call bell/phone within reach;with family/visitor present Nurse Communication: Mobility status;Other (comment) (ready for D/C )  GO     WARD,HILLARY 09/27/2012, 1:14 PM [facility], OTR/L  [phone] 09/27/2012

## 2012-09-27 NOTE — Progress Notes (Signed)
Subjective: Patient reports "My hips are just in misery. I wanted to go home until this pain started"  Objective: Vital signs in last 24 hours: Temp:  [97.8 F (36.6 C)-98.5 F (36.9 C)] 97.9 F (36.6 C) (03/13 1610) Pulse Rate:  [83-90] 87 (03/13 0633) Resp:  [16-18] 18 (03/13 0633) BP: (96-120)/(53-65) 114/57 mmHg (03/13 0633) SpO2:  [94 %-100 %] 97 % (03/13 0633)  Intake/Output from previous day:   Intake/Output this shift: Total I/O In: 240 [P.O.:240] Out: -   Alert, conversant. Reporting bilat hip pain and calf pain. No swelling LE. No pain with dorsiflexion. BP stable this am after walking from chair.   Lab Results: No results found for this basename: WBC, HGB, HCT, PLT,  in the last 72 hours BMET No results found for this basename: NA, K, CL, CO2, GLUCOSE, BUN, CREATININE, CALCIUM,  in the last 72 hours  Studies/Results: No results found.  Assessment/Plan: Improving slowly   LOS: 7 days  Continue to mobilize in LSO.  Encouraged use of Robaxin TID. Will continue Dilaudid po as this does not cause GI upset for pt.  Will return today to discuss d/c to home.   Georgiann Cocker 09/27/2012, 8:22 AM

## 2012-09-27 NOTE — Progress Notes (Signed)
Physical Therapy Treatment Patient Details Name: [name] [name] MRN: [id number] DOB: 1945-08-02 Today's Date: 09/27/2012 Time:  -     PT Assessment / Plan / Recommendation Comments on Treatment Session  Pt reports she will be going today & requests to hold therapy at this time to rest however pt was educated & demonstrated on car transfers & addressed remaining questions that pt had prior to d/cing home.      Follow Up Recommendations  Home health PT     Does the patient have the potential to tolerate intense rehabilitation     Barriers to Discharge        Equipment Recommendations  None recommended by PT    Recommendations for Other Services    Frequency Min 5X/week   Plan Discharge plan remains appropriate;Frequency remains appropriate    Precautions / Restrictions Precautions Precautions: Back Required Braces or Orthoses: Spinal Brace Spinal Brace: Applied in supine position            PT Goals Acute Rehab PT Goals Time For Goal Achievement: 09/28/12 Potential to Achieve Goals: Good  Visit Information  Last PT Received On: 09/27/12 Assistance Needed: +1         Verdell Face, [place] [phone] 09/27/2012

## 2012-09-27 NOTE — Discharge Summary (Signed)
Physician Discharge Summary  Patient ID: [name] [name] MRN: [id number] DOB/AGE: 01/13/1946 67 y.o.  Admit date: 09/20/2012 Discharge date: 09/27/2012  Admission Diagnoses: Spondylolisthesis, Lumbar stenosis, Scoliosis, Lumbar Radiculopathy L 1 through S 1 levels    Discharge Diagnoses: Spondylolisthesis, Lumbar stenosis, Scoliosis, Lumbar Radiculopathy L 1 through S 1 levels ANTERIOR LATERAL LUMBAR FUSION 4 LEVELS (Right) - Right L1-2 L2-3 L3-4 L4-5 Anterolateral decompression/fusion/with L5-S1 Posterior lumbar interbody fusion/Percutaneous pedicle screws L1-S1 LUMBAR PERCUTANEOUS PEDICLE SCREW 4 LEVEL (Bilateral) - Posterior Lumbar Five-Sacral One Interbody and Fusion and Percutaneous Screws Lumbar One-Sacral One with posterolateral arthrodesis L 5 - S 1  Principal Problem:   Lumbar scoliosis   Discharged Condition: good  Hospital Course: [name] was admitted for surgery 09-20-12 with dx Spondylolisthesis, Lumbar stenosis, Scoliosis, Lumbar Radiculopathy L 1 through S 1 levels. Following uncomplicated surgery, she recovered nicely in Neuro PACU.  She has progressed slowly, limited by pain and general weakness. Pain is now well-controlled and she tolerates more activity.     Consults: None  Significant Diagnostic Studies: radiology: X-Ray: intra-operative  Treatments: surgery: ANTERIOR LATERAL LUMBAR FUSION 4 LEVELS (Right) - Right L1-2 L2-3 L3-4 L4-5 Anterolateral decompression/fusion/with L5-S1 Posterior lumbar interbody fusion/Percutaneous pedicle screws L1-S1 LUMBAR PERCUTANEOUS PEDICLE SCREW 4 LEVEL (Bilateral) - Posterior Lumbar Five-Sacral One Interbody and Fusion and Percutaneous Screws Lumbar One-Sacral One with posterolateral arthrodesis L 5 - S 1   Discharge Exam: Blood pressure 104/58, pulse 91, temperature 97.9 F (36.6 C), temperature source Oral, resp. rate 18, height 5\' 4"  (1.626 m), weight 71.215 kg (157 lb), SpO2 95.00%. Alert, reporting much improved right  hip pain. Ambulated with PT today. Good strength BLE. Incisions without erythema, swelling, or drainage.   Disposition: Discharge to home. HHPT. Rx's to pt: Dilaudid 2mg  1-2 po q4hrs prn pain, Robaxin 500mg  1poq8hrs prn spasm. Pt verbalizes understanding of d/c instructions & agrees to call office to schedule f/u appt with Dr. Venetia Maxon in 3-4weeks     Medication List    STOP taking these medications       chlorthalidone 25 MG tablet  Commonly known as:  HYGROTON     meloxicam 15 MG tablet  Commonly known as:  MOBIC     metoprolol succinate 100 MG 24 hr tablet  Commonly known as:  TOPROL-XL      TAKE these medications       bisacodyl 5 MG EC tablet  Commonly known as:  DULCOLAX  Take 5 mg by mouth daily.     HYDROcodone-acetaminophen 5-325 MG per tablet  Commonly known as:  NORCO/VICODIN  Take 1 tablet by mouth every 6 (six) hours as needed for pain.     loratadine 10 MG tablet  Commonly known as:  CLARITIN  Take 10 mg by mouth daily.     nortriptyline 25 MG capsule  Commonly known as:  PAMELOR  Take 75 mg by mouth at bedtime.     omeprazole 20 MG capsule  Commonly known as:  PRILOSEC  Take 20 mg by mouth daily.     pravastatin 40 MG tablet  Commonly known as:  PRAVACHOL  Take 40 mg by mouth daily.     tolnaftate 1 % cream  Commonly known as:  TINACTIN  Apply 1 application topically 2 (two) times daily. Apply to feet.         Signed: Georgiann Cocker 09/27/2012, 2:15 PM

## 2012-12-02 DIAGNOSIS — M549 Dorsalgia, unspecified: Secondary | ICD-10-CM | POA: Insufficient documentation

## 2013-06-03 DIAGNOSIS — M48061 Spinal stenosis, lumbar region without neurogenic claudication: Secondary | ICD-10-CM | POA: Insufficient documentation

## 2013-06-03 DIAGNOSIS — E785 Hyperlipidemia, unspecified: Secondary | ICD-10-CM | POA: Insufficient documentation

## 2013-07-26 DIAGNOSIS — J45909 Unspecified asthma, uncomplicated: Secondary | ICD-10-CM | POA: Insufficient documentation

## 2013-07-26 DIAGNOSIS — M25559 Pain in unspecified hip: Secondary | ICD-10-CM | POA: Insufficient documentation

## 2014-11-24 ENCOUNTER — Encounter: Payer: Self-pay | Admitting: Gastroenterology

## 2015-03-30 DIAGNOSIS — Z0000 Encounter for general adult medical examination without abnormal findings: Secondary | ICD-10-CM | POA: Insufficient documentation

## 2015-04-10 ENCOUNTER — Other Ambulatory Visit: Payer: Self-pay | Admitting: Family Medicine

## 2015-04-10 DIAGNOSIS — Z1231 Encounter for screening mammogram for malignant neoplasm of breast: Secondary | ICD-10-CM

## 2015-07-19 HISTORY — PX: CATARACT EXTRACTION W/ INTRAOCULAR LENS  IMPLANT, BILATERAL: SHX1307

## 2016-03-08 DIAGNOSIS — M25561 Pain in right knee: Secondary | ICD-10-CM | POA: Insufficient documentation

## 2016-04-04 DIAGNOSIS — Z0000 Encounter for general adult medical examination without abnormal findings: Secondary | ICD-10-CM | POA: Insufficient documentation

## 2016-08-29 DIAGNOSIS — M791 Myalgia: Secondary | ICD-10-CM | POA: Diagnosis not present

## 2016-08-29 DIAGNOSIS — G43109 Migraine with aura, not intractable, without status migrainosus: Secondary | ICD-10-CM | POA: Diagnosis not present

## 2016-08-29 DIAGNOSIS — G518 Other disorders of facial nerve: Secondary | ICD-10-CM | POA: Diagnosis not present

## 2016-08-29 DIAGNOSIS — M542 Cervicalgia: Secondary | ICD-10-CM | POA: Diagnosis not present

## 2016-08-29 DIAGNOSIS — G43019 Migraine without aura, intractable, without status migrainosus: Secondary | ICD-10-CM | POA: Diagnosis not present

## 2016-08-29 DIAGNOSIS — G43719 Chronic migraine without aura, intractable, without status migrainosus: Secondary | ICD-10-CM | POA: Diagnosis not present

## 2016-08-29 DIAGNOSIS — R51 Headache: Secondary | ICD-10-CM | POA: Diagnosis not present

## 2016-08-29 DIAGNOSIS — G43111 Migraine with aura, intractable, with status migrainosus: Secondary | ICD-10-CM | POA: Diagnosis not present

## 2016-09-14 ENCOUNTER — Emergency Department (HOSPITAL_COMMUNITY): Payer: PPO

## 2016-09-14 ENCOUNTER — Encounter (HOSPITAL_COMMUNITY): Payer: Self-pay | Admitting: Emergency Medicine

## 2016-09-14 ENCOUNTER — Inpatient Hospital Stay (HOSPITAL_COMMUNITY)
Admission: EM | Admit: 2016-09-14 | Discharge: 2016-09-16 | DRG: 247 | Disposition: A | Discharge status: Home / Self Care | Payer: PPO | Attending: Cardiology | Admitting: Cardiology

## 2016-09-14 DIAGNOSIS — I10 Essential (primary) hypertension: Secondary | ICD-10-CM | POA: Diagnosis not present

## 2016-09-14 DIAGNOSIS — Z87892 Personal history of anaphylaxis: Secondary | ICD-10-CM | POA: Diagnosis not present

## 2016-09-14 DIAGNOSIS — Z91018 Allergy to other foods: Secondary | ICD-10-CM | POA: Diagnosis not present

## 2016-09-14 DIAGNOSIS — I200 Unstable angina: Secondary | ICD-10-CM | POA: Diagnosis not present

## 2016-09-14 DIAGNOSIS — E785 Hyperlipidemia, unspecified: Secondary | ICD-10-CM | POA: Diagnosis not present

## 2016-09-14 DIAGNOSIS — I214 Non-ST elevation (NSTEMI) myocardial infarction: Secondary | ICD-10-CM | POA: Diagnosis not present

## 2016-09-14 DIAGNOSIS — Z6829 Body mass index (BMI) 29.0-29.9, adult: Secondary | ICD-10-CM

## 2016-09-14 DIAGNOSIS — Z8249 Family history of ischemic heart disease and other diseases of the circulatory system: Secondary | ICD-10-CM

## 2016-09-14 DIAGNOSIS — K219 Gastro-esophageal reflux disease without esophagitis: Secondary | ICD-10-CM | POA: Diagnosis present

## 2016-09-14 DIAGNOSIS — I25110 Atherosclerotic heart disease of native coronary artery with unstable angina pectoris: Secondary | ICD-10-CM | POA: Diagnosis present

## 2016-09-14 DIAGNOSIS — R0789 Other chest pain: Secondary | ICD-10-CM | POA: Diagnosis not present

## 2016-09-14 DIAGNOSIS — Z885 Allergy status to narcotic agent status: Secondary | ICD-10-CM | POA: Diagnosis not present

## 2016-09-14 DIAGNOSIS — R071 Chest pain on breathing: Secondary | ICD-10-CM | POA: Diagnosis not present

## 2016-09-14 DIAGNOSIS — R079 Chest pain, unspecified: Secondary | ICD-10-CM | POA: Diagnosis not present

## 2016-09-14 DIAGNOSIS — Z955 Presence of coronary angioplasty implant and graft: Secondary | ICD-10-CM

## 2016-09-14 DIAGNOSIS — R0602 Shortness of breath: Secondary | ICD-10-CM | POA: Diagnosis not present

## 2016-09-14 DIAGNOSIS — I119 Hypertensive heart disease without heart failure: Secondary | ICD-10-CM | POA: Diagnosis present

## 2016-09-14 DIAGNOSIS — I252 Old myocardial infarction: Secondary | ICD-10-CM | POA: Diagnosis present

## 2016-09-14 HISTORY — DX: Non-ST elevation (NSTEMI) myocardial infarction: I21.4

## 2016-09-14 HISTORY — DX: Exercise induced bronchospasm: J45.990

## 2016-09-14 HISTORY — DX: Migraine, unspecified, not intractable, without status migrainosus: G43.909

## 2016-09-14 HISTORY — DX: Pure hypercholesterolemia, unspecified: E78.00

## 2016-09-14 HISTORY — DX: Pneumonia, unspecified organism: J18.9

## 2016-09-14 HISTORY — DX: Cardiac murmur, unspecified: R01.1

## 2016-09-14 HISTORY — DX: Family history of other specified conditions: Z84.89

## 2016-09-14 HISTORY — DX: Atherosclerotic heart disease of native coronary artery without angina pectoris: I25.10

## 2016-09-14 LAB — COMPREHENSIVE METABOLIC PANEL
ALBUMIN: 3.4 g/dL — AB (ref 3.5–5.0)
ALT: 20 U/L (ref 14–54)
ANION GAP: 9 (ref 5–15)
AST: 19 U/L (ref 15–41)
Alkaline Phosphatase: 50 U/L (ref 38–126)
BILIRUBIN TOTAL: 0.4 mg/dL (ref 0.3–1.2)
BUN: 9 mg/dL (ref 6–20)
CO2: 26 mmol/L (ref 22–32)
Calcium: 9.7 mg/dL (ref 8.9–10.3)
Chloride: 104 mmol/L (ref 101–111)
Creatinine, Ser: 0.76 mg/dL (ref 0.44–1.00)
GFR calc non Af Amer: >60 mL/min (ref >60)
GLUCOSE: 121 mg/dL — AB (ref 65–99)
POTASSIUM: 3.7 mmol/L (ref 3.5–5.1)
SODIUM: 139 mmol/L (ref 135–145)
TOTAL PROTEIN: 6.2 g/dL — AB (ref 6.5–8.1)

## 2016-09-14 LAB — CBC WITH DIFFERENTIAL/PLATELET
BASOS ABS: 0 10*3/uL (ref 0.0–0.1)
BASOS PCT: 0 %
EOS ABS: 0.2 10*3/uL (ref 0.0–0.7)
Eosinophils Relative: 3 %
HCT: 37.2 % (ref 36.0–46.0)
HEMOGLOBIN: 12.3 g/dL (ref 12.0–15.0)
LYMPHS ABS: 2.7 10*3/uL (ref 0.7–4.0)
Lymphocytes Relative: 40 %
MCH: 28.2 pg (ref 26.0–34.0)
MCHC: 33.1 g/dL (ref 30.0–36.0)
MCV: 85.3 fL (ref 78.0–100.0)
Monocytes Absolute: 0.4 10*3/uL (ref 0.1–1.0)
Monocytes Relative: 7 %
NEUTROS PCT: 50 %
Neutro Abs: 3.4 10*3/uL (ref 1.7–7.7)
Platelets: 243 10*3/uL (ref 150–400)
RBC: 4.36 MIL/uL (ref 3.87–5.11)
RDW: 13.6 % (ref 11.5–15.5)
WBC: 6.7 10*3/uL (ref 4.0–10.5)

## 2016-09-14 LAB — BASIC METABOLIC PANEL
ANION GAP: 9 (ref 5–15)
BUN: 11 mg/dL (ref 6–20)
CHLORIDE: 107 mmol/L (ref 101–111)
CO2: 25 mmol/L (ref 22–32)
Calcium: 9.8 mg/dL (ref 8.9–10.3)
Creatinine, Ser: 0.72 mg/dL (ref 0.44–1.00)
Glucose, Bld: 98 mg/dL (ref 65–99)
POTASSIUM: 3.6 mmol/L (ref 3.5–5.1)
SODIUM: 141 mmol/L (ref 135–145)

## 2016-09-14 LAB — PROTIME-INR
INR: 1.03
PROTHROMBIN TIME: 13.6 s (ref 11.4–15.2)

## 2016-09-14 LAB — CBC
HEMATOCRIT: 39.1 % (ref 36.0–46.0)
HEMOGLOBIN: 13 g/dL (ref 12.0–15.0)
MCH: 28.7 pg (ref 26.0–34.0)
MCHC: 33.2 g/dL (ref 30.0–36.0)
MCV: 86.3 fL (ref 78.0–100.0)
Platelets: 249 10*3/uL (ref 150–400)
RBC: 4.53 MIL/uL (ref 3.87–5.11)
RDW: 13.9 % (ref 11.5–15.5)
WBC: 6.8 10*3/uL (ref 4.0–10.5)

## 2016-09-14 LAB — MAGNESIUM: Magnesium: 2 mg/dL (ref 1.7–2.4)

## 2016-09-14 LAB — TROPONIN I
TROPONIN I: 0.14 ng/mL — AB (ref <0.03)
TROPONIN I: 0.17 ng/mL — AB (ref <0.03)

## 2016-09-14 LAB — APTT: APTT: 68 s — AB (ref 24–36)

## 2016-09-14 LAB — D-DIMER, QUANTITATIVE: D-Dimer, Quant: 0.49 ug/mL-FEU (ref 0.00–0.50)

## 2016-09-14 MED ORDER — ASPIRIN EC 81 MG PO TBEC
81.0000 mg | DELAYED_RELEASE_TABLET | Freq: Every day | ORAL | Status: DC
  Administered 2016-09-16: 10:00:00 81 mg via ORAL
  Filled 2016-09-14: qty 1

## 2016-09-14 MED ORDER — ASPIRIN 81 MG PO CHEW
324.0000 mg | CHEWABLE_TABLET | ORAL | Status: AC
  Filled 2016-09-14: qty 4

## 2016-09-14 MED ORDER — SODIUM CHLORIDE 0.9% FLUSH: 3.0000 mL | INTRAVENOUS | Status: DC | PRN

## 2016-09-14 MED ORDER — ONDANSETRON HCL 4 MG/2ML IJ SOLN
4.0000 mg | Freq: Four times a day (QID) | INTRAMUSCULAR | Status: DC | PRN
  Administered 2016-09-15 (×2): 4 mg via INTRAVENOUS
  Filled 2016-09-14 (×3): qty 2

## 2016-09-14 MED ORDER — ATORVASTATIN CALCIUM 80 MG PO TABS
80.0000 mg | ORAL_TABLET | Freq: Every day | ORAL | Status: DC
  Administered 2016-09-14 – 2016-09-15 (×2): 80 mg via ORAL
  Filled 2016-09-14 (×2): qty 1

## 2016-09-14 MED ORDER — CLOPIDOGREL BISULFATE 300 MG PO TABS
300.0000 mg | ORAL_TABLET | Freq: Once | ORAL | Status: AC
  Administered 2016-09-14: 300 mg via ORAL
  Filled 2016-09-14: qty 1

## 2016-09-14 MED ORDER — METOPROLOL TARTRATE 12.5 MG HALF TABLET
12.5000 mg | ORAL_TABLET | Freq: Two times a day (BID) | ORAL | Status: DC
  Administered 2016-09-14 – 2016-09-16 (×4): 12.5 mg via ORAL
  Filled 2016-09-14 (×4): qty 1

## 2016-09-14 MED ORDER — HEPARIN BOLUS VIA INFUSION
4000.0000 [IU] | Freq: Once | INTRAVENOUS | Status: AC
  Administered 2016-09-14: 4000 [IU] via INTRAVENOUS
  Filled 2016-09-14: qty 4000

## 2016-09-14 MED ORDER — ASPIRIN 300 MG RE SUPP: 300.0000 mg | RECTAL | Status: AC

## 2016-09-14 MED ORDER — HEPARIN (PORCINE) IN NACL 100-0.45 UNIT/ML-% IJ SOLN
1000.0000 [IU]/h | INTRAMUSCULAR | Status: DC
  Administered 2016-09-14: 850 [IU]/h via INTRAVENOUS
  Filled 2016-09-14: qty 250

## 2016-09-14 MED ORDER — SODIUM CHLORIDE 0.9 % IV SOLN: 250.0000 mL | INTRAVENOUS | Status: DC | PRN

## 2016-09-14 MED ORDER — SODIUM CHLORIDE 0.9% FLUSH: 3.0000 mL | Freq: Two times a day (BID) | INTRAVENOUS | Status: DC

## 2016-09-14 MED ORDER — NORTRIPTYLINE HCL 25 MG PO CAPS
25.0000 mg | ORAL_CAPSULE | Freq: Every day | ORAL | Status: DC
  Administered 2016-09-14 – 2016-09-15 (×2): 25 mg via ORAL
  Filled 2016-09-14 (×2): qty 1

## 2016-09-14 MED ORDER — NITROGLYCERIN IN D5W 200-5 MCG/ML-% IV SOLN
5.0000 ug/min | INTRAVENOUS | Status: DC
  Administered 2016-09-14: 5 ug/min via INTRAVENOUS
  Filled 2016-09-14: qty 250

## 2016-09-14 MED ORDER — NITROGLYCERIN 0.4 MG SL SUBL: 0.4000 mg | SUBLINGUAL_TABLET | SUBLINGUAL | Status: DC | PRN

## 2016-09-14 MED ORDER — PANTOPRAZOLE SODIUM 40 MG PO TBEC
40.0000 mg | DELAYED_RELEASE_TABLET | Freq: Every day | ORAL | Status: DC
  Administered 2016-09-15 – 2016-09-16 (×2): 40 mg via ORAL
  Filled 2016-09-14 (×2): qty 1

## 2016-09-14 MED ORDER — SODIUM CHLORIDE 0.9 % WEIGHT BASED INFUSION
1.0000 mL/kg/h | INTRAVENOUS | Status: DC
  Administered 2016-09-14: 1 mL/kg/h via INTRAVENOUS

## 2016-09-14 MED ORDER — SODIUM CHLORIDE 0.9 % IV SOLN: INTRAVENOUS | Status: DC

## 2016-09-14 MED ORDER — ACETAMINOPHEN 325 MG PO TABS
650.0000 mg | ORAL_TABLET | ORAL | Status: DC | PRN
  Administered 2016-09-14 – 2016-09-16 (×3): 650 mg via ORAL
  Filled 2016-09-14 (×3): qty 2

## 2016-09-14 NOTE — ED Triage Notes (Signed)
Pt woke up Sunday morning with CP/SOB.Pt continues to feel SOB. Pt has had cough/fever and some chest pressure. Pt seen at md office today. Given 324mg  ASA and 4nitro. Pain relieved. BP 150/97, HR 100, sat 95%. Sinus rhythm on the monitor.

## 2016-09-14 NOTE — ED Notes (Signed)
Alert no distress 

## 2016-09-14 NOTE — ED Notes (Signed)
Lab reports they will add a troponin on.

## 2016-09-14 NOTE — H&P (Signed)
[name] [name] is an 71 y.o. female.   Chief Complaint: Recurrent retrosternal chest pain associated with shortness of breath and weakness for last 3 days HPI: Patient is 71 year old female with past medical history significant for hypertension, hyperlipidemia, history of migraine headache, GERD,  asthma, strong family history of coronary artery disease his 3 maternal uncles had MI in their 50sCame to the ER complaining of jaw and neck pain initially which started Sunday morning and later on had chest pressure described as heaviness grade 4/5*seek any medical attention next day felt week associated shortness of breath and again this morning had chest chest pressure and decided to go to PMDs office received aspirin and 4 sublingual nitroglycerin with 3 for chest pain. EKG showed normal sinus rhythm and LVH with nonspecific T wave changes patient was noted to have minimally elevated troponin I. Patient presently denies any chest pain. Denies such episodes of chest pain in the past.  Past Medical History:  Diagnosis Date  . Arthritis   . Asthma    exercise induced- does not have a current inhaler.  . Complication of anesthesia   . Constipation   . GERD (gastroesophageal reflux disease)   . Headache(784.0)    Mirgraine   . Hypertension   . Necrotizing fasciitis (HCC)    R index finger- had a amp  . PONV (postoperative nausea and vomiting)    Patch helps  . RSD (reflex sympathetic dystrophy)    Right arm after surgery    Past Surgical History:  Procedure Laterality Date  . ANTERIOR LATERAL LUMBAR FUSION 4 LEVELS Right 09/20/2012   Procedure: ANTERIOR LATERAL LUMBAR FUSION 4 LEVELS;  Surgeon: Erline Levine, MD;  Location: [facility];  Service: Neurosurgery;  Laterality: Right;  Right L1-2 L2-3 L3-4 L4-5 Anterolateral decompression/fusion/with L5-S1 Posterior lumbar interbody fusion/Percutaneous pedicle screws L1-S1  . Index finger Right    amputation  . TONSILLECTOMY      History reviewed.  No pertinent family history. Social History:  reports that she has never smoked. She has never used smokeless tobacco. She reports that she does not drink alcohol or use drugs.  Allergies:  Allergies  Allergen Reactions  . Codeine Anaphylaxis  . Garlic Anaphylaxis     (Not in a hospital admission)  Results for orders placed or performed during the hospital encounter of 09/14/16 (from the past 48 hour(s))  Basic metabolic panel     Status: None   Collection Time: 09/14/16  2:27 PM  Result Value Ref Range   Sodium 141 135 - 145 mmol/L   Potassium 3.6 3.5 - 5.1 mmol/L   Chloride 107 101 - 111 mmol/L   CO2 25 22 - 32 mmol/L   Glucose, Bld 98 65 - 99 mg/dL   BUN 11 6 - 20 mg/dL   Creatinine, Ser 0.72 0.44 - 1.00 mg/dL   Calcium 9.8 8.9 - 10.3 mg/dL   GFR calc non Af Amer >60 >60 mL/min   GFR calc Af Amer >60 >60 mL/min    Comment: (NOTE) The eGFR has been calculated using the CKD EPI equation. This calculation has not been validated in all clinical situations. eGFR's persistently <60 mL/min signify possible Chronic Kidney Disease.    Anion gap 9 5 - 15  CBC     Status: None   Collection Time: 09/14/16  2:27 PM  Result Value Ref Range   WBC 6.8 4.0 - 10.5 K/uL   RBC 4.53 3.87 - 5.11 MIL/uL   Hemoglobin 13.0 12.0 -  15.0 g/dL   HCT 39.1 36.0 - 46.0 %   MCV 86.3 78.0 - 100.0 fL   MCH 28.7 26.0 - 34.0 pg   MCHC 33.2 30.0 - 36.0 g/dL   RDW 13.9 11.5 - 15.5 %   Platelets 249 150 - 400 K/uL  Troponin I     Status: Abnormal   Collection Time: 09/14/16  2:27 PM  Result Value Ref Range   Troponin I 0.14 (HH) <0.03 ng/mL    Comment: CRITICAL RESULT CALLED TO, READ BACK BY AND VERIFIED WITH: S. FLACK RN [id number] R   D-dimer, quantitative (not at [facility])     Status: None   Collection Time: 09/14/16  2:54 PM  Result Value Ref Range   D-Dimer, Quant 0.49 0.00 - 0.50 ug/mL-FEU    Comment: (NOTE) At the manufacturer cut-off of 0.50 ug/mL FEU, this assay has been documented  to exclude PE with a sensitivity and negative predictive value of 97 to 99%.  At this time, this assay has not been approved by the FDA to exclude DVT/VTE. Results should be correlated with clinical presentation.    Dg Chest 2 View  Result Date: 09/14/2016 CLINICAL DATA:  Bilateral jaw, neck, and shoulder pain with left arm tingling since Sunday. Chest pain and weakness beginning yesterday. EXAM: CHEST  2 VIEW COMPARISON:  Two-view chest x-ray 09/14/2012 FINDINGS: The heart size is normal. Chronic interstitial coarsening is present. There is slight increase in a diffuse interstitial pattern. No significant airspace consolidation is present. There are no effusions. Lumbar fusion is noted. IMPRESSION: 1. Mild interstitial pattern superimposed on chronic coarsening. This may represent mild edema. 2. No focal airspace consolidation or effusion. 3. Lumbar fusion. Electronically Signed   By: San Morelle M.D.   On: 09/14/2016 15:33    Review of Systems  Constitutional: Negative for fever.  Eyes: Negative for double vision.  Respiratory: Positive for shortness of breath.   Cardiovascular: Positive for chest pain. Negative for palpitations, orthopnea, claudication, leg swelling and PND.  Gastrointestinal: Negative for abdominal pain, nausea and vomiting.  Genitourinary: Negative for dysuria.  Neurological: Positive for weakness. Negative for dizziness.    Blood pressure 116/72, pulse 92, temperature 98.3 F (36.8 C), temperature source Oral, resp. rate 13, height _0  (1.651 m), weight 169 lb (76.7 kg), SpO2 98 %. Physical Exam  Constitutional: She is oriented to person, place, and time.  HENT:  Head: Normocephalic and atraumatic.  Eyes: Conjunctivae are normal. Pupils are equal, round, and reactive to light. Left eye exhibits no discharge. No scleral icterus.  Neck: Normal range of motion. Neck supple. No JVD present. No tracheal deviation present. No thyromegaly present.   Cardiovascular: Normal rate and regular rhythm.   Murmur (Soft systolic murmur and S4 gallop noted) heard. Respiratory: Effort normal and breath sounds normal. No respiratory distress. She has no wheezes. She has no rales.  GI: Soft. Bowel sounds are normal. She exhibits distension. There is no tenderness. There is no rebound.  Musculoskeletal: She exhibits deformity. She exhibits no edema or tenderness.  Neurological: She is alert and oriented to person, place, and time.     Assessment/Plan Acute small non-Q-wave myocardial infarction. Hypertension Hyperlipidemia Morbid obesity GERD history of migraine headache Strong family history of coronary artery disease Plan As per orders Discussed with patient and her husband at length regarding left cardiac catheterization possible PTCA stenting its risk and benefits i.e. death MI stroke need for emergency CABG local vascular complications etc. and consents  for PCI Charolette Forward, MD 09/14/2016, 5:54 PM

## 2016-09-14 NOTE — ED Notes (Signed)
Food given 

## 2016-09-14 NOTE — Progress Notes (Signed)
ANTICOAGULATION CONSULT NOTE - Initial Consult  Pharmacy Consult for heparin Indication: chest pain/ACS  Allergies  Allergen Reactions  . Codeine Anaphylaxis  . Garlic Anaphylaxis    Patient Measurements: Height: 5\' 5"  (165.1 cm) Weight: 169 lb (76.7 kg) IBW/kg (Calculated) : 57 Heparin Dosing Weight: 73 kg  Vital Signs: Temp: 98.3 F (36.8 C) (02/28 1357) Temp Source: Oral (02/28 1357) BP: 116/72 (02/28 1500) Pulse Rate: 92 (02/28 1500)  Labs:  Recent Labs  09/14/16 1427  HGB 13.0  HCT 39.1  PLT 249  CREATININE 0.72  TROPONINI 0.14*    Estimated Creatinine Clearance: 66.1 mL/min (by C-G formula based on SCr of 0.72 mg/dL).   Medical History: Past Medical History:  Diagnosis Date  . Arthritis   . Asthma    exercise induced- does not have a current inhaler.  . Complication of anesthesia   . Constipation   . GERD (gastroesophageal reflux disease)   . Headache(784.0)    Mirgraine   . Hypertension   . Necrotizing fasciitis (HCC)    R index finger- had a amp  . PONV (postoperative nausea and vomiting)    Patch helps  . RSD (reflex sympathetic dystrophy)    Right arm after surgery    Assessment: [age] yoF presents w/ CP and SOB to begin heparin per pharmacy for ACS r/o. Pt has no hx CAD and is not on anticoagulation PTA. CBC wnl, troponin slightly elevated.  Goal of Therapy:  Heparin level 0.3-0.7 units/ml Monitor platelets by anticoagulation protocol: Yes   Plan:  -Heparin 4000 units x1 -Heparin 850 units/hr -Check 8-hr heparin level -Monitor heparin level, CBC, S/Sx bleeding daily  Fredonia HighlandMichael Braison Snoke, PharmD PGY-1 Pharmacy Resident Pager: [phone]

## 2016-09-14 NOTE — ED Provider Notes (Signed)
[facility]-EMERGENCY DEPT Provider Note   CSN: [id number] Arrival date & time: 09/14/16  1341     History   Chief Complaint Chief Complaint  Patient presents with  . Chest Pain  . Shortness of Breath    HPI [name] TERRERO is a 71 y.o. female.  HPI Patient presents with chest pressure and shortness of breath began on Sunday.  She's had this intermittently since then.  She feels weak all over.  She continues to feel short of breath.  She does report cough and possible fever.  She was seen at her primary care doctor's office today and given aspirin nitroglycerin.  She reports the nitroglycerin helped her chest discomfort.  She does have a history of exercise induced asthma.  No history DVT or pulmonary embolism.  No history of coronary artery disease.  She does have a history of hypertension.  Positive family history for cardiac disease.  Denies orthopnea.   Past Medical History:  Diagnosis Date  . Arthritis   . Asthma    exercise induced- does not have a current inhaler.  . Complication of anesthesia   . Constipation   . GERD (gastroesophageal reflux disease)   . Headache(784.0)    Mirgraine   . Hypertension   . Necrotizing fasciitis (HCC)    R index finger- had a amp  . PONV (postoperative nausea and vomiting)    Patch helps  . RSD (reflex sympathetic dystrophy)    Right arm after surgery    Patient Active Problem List   Diagnosis Date Noted  . Lumbar scoliosis 09/21/2012  . TENSION HEADACHE 07/24/2009  . HYPERTENSION 07/24/2009  . GERD 07/24/2009  . OSTEOARTHRITIS 07/24/2009    Past Surgical History:  Procedure Laterality Date  . ANTERIOR LATERAL LUMBAR FUSION 4 LEVELS Right 09/20/2012   Procedure: ANTERIOR LATERAL LUMBAR FUSION 4 LEVELS;  Surgeon: Maeola Harman, MD;  Location: [facility];  Service: Neurosurgery;  Laterality: Right;  Right L1-2 L2-3 L3-4 L4-5 Anterolateral decompression/fusion/with L5-S1 Posterior lumbar interbody fusion/Percutaneous pedicle screws  L1-S1  . Index finger Right    amputation  . TONSILLECTOMY      OB History    No data available       Home Medications    Prior to Admission medications   Medication Sig Start Date End Date Taking? Authorizing Provider  bisacodyl (DULCOLAX) 5 MG EC tablet Take 5 mg by mouth daily.    Historical Provider, MD  HYDROcodone-acetaminophen (NORCO/VICODIN) 5-325 MG per tablet Take 1 tablet by mouth every 6 (six) hours as needed for pain.    Historical Provider, MD  loratadine (CLARITIN) 10 MG tablet Take 10 mg by mouth daily.    Historical Provider, MD  nortriptyline (PAMELOR) 25 MG capsule Take 75 mg by mouth at bedtime.    Historical Provider, MD  omeprazole (PRILOSEC) 20 MG capsule Take 20 mg by mouth daily.    Historical Provider, MD  pravastatin (PRAVACHOL) 40 MG tablet Take 40 mg by mouth daily.    Historical Provider, MD  tolnaftate (TINACTIN) 1 % cream Apply 1 application topically 2 (two) times daily. Apply to feet.    Historical Provider, MD    Family History History reviewed. No pertinent family history.  Social History Social History  Substance Use Topics  . Smoking status: Never Smoker  . Smokeless tobacco: Never Used  . Alcohol use No     Allergies   Codeine and Garlic   Review of Systems Review of Systems  All other  systems reviewed and are negative.    Physical Exam Updated Vital Signs BP 116/72   Pulse 92   Temp 98.3 F (36.8 C) (Oral)   Resp 13   Ht 5\' 5"  (1.651 m)   Wt 169 lb (76.7 kg)   SpO2 98%   BMI 28.12 kg/m   Physical Exam  Constitutional: She is oriented to person, place, and time. She appears well-developed and well-nourished. No distress.  HENT:  Head: Normocephalic and atraumatic.  Eyes: EOM are normal.  Neck: Normal range of motion.  Cardiovascular: Normal rate, regular rhythm and normal heart sounds.   Pulmonary/Chest: Effort normal and breath sounds normal.  Abdominal: Soft. She exhibits no distension. There is no  tenderness.  Musculoskeletal: Normal range of motion.  Neurological: She is alert and oriented to person, place, and time.  Skin: Skin is warm and dry.  Psychiatric: She has a normal mood and affect. Judgment normal.  Nursing note and vitals reviewed.    ED Treatments / Results  Labs (all labs ordered are listed, but only abnormal results are displayed) Labs Reviewed  TROPONIN I - Abnormal; Notable for the following:       Result Value   Troponin I 0.14 (*)    All other components within normal limits  BASIC METABOLIC PANEL  CBC  D-DIMER, QUANTITATIVE (NOT AT [facility])  Rosezena Sensor, ED    EKG  EKG Interpretation  Date/Time:  Wednesday September 14 2016 13:53:05 EST Ventricular Rate:  89 PR Interval:    QRS Duration: 83 QT Interval:  325 QTC Calculation: 396 R Axis:   -11 Text Interpretation:  Sinus rhythm Probable left atrial enlargement Probable left ventricular hypertrophy nonspecifc infeiorr lateral st changes as compared to ecg on march 2008 Confirmed by Kort Stettler  MD, Caryn Bee ([id number]) on 09/14/2016 2:42:25 PM       Radiology Dg Chest 2 View  Result Date: 09/14/2016 CLINICAL DATA:  Bilateral jaw, neck, and shoulder pain with left arm tingling since Sunday. Chest pain and weakness beginning yesterday. EXAM: CHEST  2 VIEW COMPARISON:  Two-view chest x-ray 09/14/2012 FINDINGS: The heart size is normal. Chronic interstitial coarsening is present. There is slight increase in a diffuse interstitial pattern. No significant airspace consolidation is present. There are no effusions. Lumbar fusion is noted. IMPRESSION: 1. Mild interstitial pattern superimposed on chronic coarsening. This may represent mild edema. 2. No focal airspace consolidation or effusion. 3. Lumbar fusion. Electronically Signed   By: Marin Roberts M.D.   On: 09/14/2016 15:33    Procedures Procedures (including critical care  time)    ++++++++++++++++++++++++++++++++++++++++++++++++++++++++++++++++  CRITICAL CARE Performed by: Lyanne Co Total critical care time: 31 minutes Critical care time was exclusive of separately billable procedures and treating other patients. Critical care was necessary to treat or prevent imminent or life-threatening deterioration. Critical care was time spent personally by me on the following activities: development of treatment plan with patient and/or surrogate as well as nursing, discussions with consultants, evaluation of patient's response to treatment, examination of patient, obtaining history from patient or surrogate, ordering and performing treatments and interventions, ordering and review of laboratory studies, ordering and review of radiographic studies, pulse oximetry and re-evaluation of patient's condition.  +++++++++++++++++++++++++++++++++++++++++++++++++++++++++++++++++++    Medications Ordered in ED Medications - No data to display   Initial Impression / Assessment and Plan / ED Course  I have reviewed the triage vital signs and the nursing notes.  Pertinent labs & imaging results that were available during my care  of the patient were reviewed by me and considered in my medical decision making (see chart for details).     5:27 PM Patient with elevated troponin.  This is a good story for unstable angina and now represents non-ST elevation MI in the setting of elevated troponin.  Patient be started on heparin at this time.  Patient is pain-free at this time.  She will remain on a monitor this time.  Heparin initiated.  I discussed her case with on-call cardiology Dr. Sharyn LullHarwani who will evaluate in the ER  Final Clinical Impressions(s) / ED Diagnoses   Final diagnoses:  NSTEMI (non-ST elevated myocardial infarction) (HCC)  Unstable angina [facility](HCC)    New Prescriptions New Prescriptions   No medications on file     Azalia BilisKevin Gloyd Happ, MD 09/14/16 1729

## 2016-09-15 ENCOUNTER — Encounter (HOSPITAL_COMMUNITY): Payer: Self-pay | Admitting: Cardiology

## 2016-09-15 ENCOUNTER — Encounter (HOSPITAL_COMMUNITY)
Admission: EM | Disposition: A | Discharge status: Home / Self Care | Payer: Self-pay | Source: Home / Self Care | Attending: Cardiology

## 2016-09-15 HISTORY — PX: CORONARY STENT INTERVENTION: CATH118234

## 2016-09-15 HISTORY — PX: LEFT HEART CATH AND CORONARY ANGIOGRAPHY: CATH118249

## 2016-09-15 HISTORY — PX: CORONARY PRESSURE/FFR STUDY: CATH118243

## 2016-09-15 LAB — BASIC METABOLIC PANEL
Anion gap: 6 (ref 5–15)
BUN: 10 mg/dL (ref 6–20)
CO2: 25 mmol/L (ref 22–32)
CREATININE: 0.76 mg/dL (ref 0.44–1.00)
Calcium: 9.3 mg/dL (ref 8.9–10.3)
Chloride: 110 mmol/L (ref 101–111)
GFR calc Af Amer: >60 mL/min (ref >60)
GFR calc non Af Amer: >60 mL/min (ref >60)
Glucose, Bld: 99 mg/dL (ref 65–99)
Potassium: 3.9 mmol/L (ref 3.5–5.1)
SODIUM: 141 mmol/L (ref 135–145)

## 2016-09-15 LAB — CBC
HEMATOCRIT: 36.2 % (ref 36.0–46.0)
HEMOGLOBIN: 11.9 g/dL — AB (ref 12.0–15.0)
MCH: 28.1 pg (ref 26.0–34.0)
MCHC: 32.9 g/dL (ref 30.0–36.0)
MCV: 85.6 fL (ref 78.0–100.0)
Platelets: 242 10*3/uL (ref 150–400)
RBC: 4.23 MIL/uL (ref 3.87–5.11)
RDW: 13.8 % (ref 11.5–15.5)
WBC: 5.8 10*3/uL (ref 4.0–10.5)

## 2016-09-15 LAB — LIPID PANEL
CHOLESTEROL: 216 mg/dL — AB (ref 0–200)
HDL: 48 mg/dL (ref >40)
LDL Cholesterol: 143 mg/dL — ABNORMAL HIGH (ref 0–99)
Total CHOL/HDL Ratio: 4.5 RATIO
Triglycerides: 124 mg/dL (ref <150)
VLDL: 25 mg/dL (ref 0–40)

## 2016-09-15 LAB — TROPONIN I
TROPONIN I: 0.14 ng/mL — AB (ref <0.03)
Troponin I: 0.16 ng/mL (ref <0.03)

## 2016-09-15 LAB — HEPARIN LEVEL (UNFRACTIONATED)
Heparin Unfractionated: 0.24 IU/mL — ABNORMAL LOW (ref 0.30–0.70)
Heparin Unfractionated: 0.39 IU/mL (ref 0.30–0.70)

## 2016-09-15 LAB — POCT ACTIVATED CLOTTING TIME: Activated Clotting Time: 351 seconds

## 2016-09-15 LAB — MRSA PCR SCREENING: MRSA BY PCR: NEGATIVE

## 2016-09-15 SURGERY — LEFT HEART CATH AND CORONARY ANGIOGRAPHY
Anesthesia: LOCAL

## 2016-09-15 MED ORDER — LIDOCAINE HCL (PF) 1 % IJ SOLN
INTRAMUSCULAR | Status: AC
  Filled 2016-09-15: qty 30

## 2016-09-15 MED ORDER — MIDAZOLAM HCL 2 MG/2ML IJ SOLN
INTRAMUSCULAR | Status: AC
  Filled 2016-09-15: qty 2

## 2016-09-15 MED ORDER — SODIUM CHLORIDE 0.9 % IV BOLUS (SEPSIS)
INTRAVENOUS | Status: DC | PRN
  Administered 2016-09-15: 250 mL via INTRAVENOUS

## 2016-09-15 MED ORDER — ASPIRIN 81 MG PO CHEW
81.0000 mg | CHEWABLE_TABLET | Freq: Every day | ORAL | Status: DC
  Administered 2016-09-16: 81 mg via ORAL
  Filled 2016-09-15: qty 1

## 2016-09-15 MED ORDER — ADENOSINE 12 MG/4ML IV SOLN
INTRAVENOUS | Status: AC
  Filled 2016-09-15: qty 4

## 2016-09-15 MED ORDER — SODIUM CHLORIDE 0.9 % IV SOLN: 250.0000 mL | INTRAVENOUS | Status: DC | PRN

## 2016-09-15 MED ORDER — NITROGLYCERIN 1 MG/10 ML FOR IR/CATH LAB
INTRA_ARTERIAL | Status: DC | PRN
  Administered 2016-09-15 (×2): 150 ug via INTRACORONARY

## 2016-09-15 MED ORDER — ADENOSINE (DIAGNOSTIC) 140MCG/KG/MIN
INTRAVENOUS | Status: DC | PRN
  Administered 2016-09-15: 140 ug/kg/min via INTRAVENOUS

## 2016-09-15 MED ORDER — SODIUM CHLORIDE 0.9 % IV SOLN
INTRAVENOUS | Status: AC
  Administered 2016-09-15: 13:00:00 via INTRAVENOUS

## 2016-09-15 MED ORDER — OXYCODONE-ACETAMINOPHEN 5-325 MG PO TABS
1.0000 | ORAL_TABLET | Freq: Four times a day (QID) | ORAL | Status: DC | PRN
  Administered 2016-09-15: 03:00:00 1 via ORAL
  Filled 2016-09-15: qty 1

## 2016-09-15 MED ORDER — BIVALIRUDIN 250 MG IV SOLR
INTRAVENOUS | Status: AC
  Filled 2016-09-15: qty 250

## 2016-09-15 MED ORDER — SODIUM CHLORIDE 0.9% FLUSH
3.0000 mL | Freq: Two times a day (BID) | INTRAVENOUS | Status: DC
  Administered 2016-09-15 – 2016-09-16 (×2): 3 mL via INTRAVENOUS

## 2016-09-15 MED ORDER — HEART ATTACK BOUNCING BOOK
Freq: Once | Status: AC
  Administered 2016-09-15: 20:00:00
  Filled 2016-09-15: qty 1

## 2016-09-15 MED ORDER — HYDRALAZINE HCL 20 MG/ML IJ SOLN
5.0000 mg | INTRAMUSCULAR | Status: AC | PRN
  Administered 2016-09-15 (×2): 5 mg via INTRAVENOUS
  Filled 2016-09-15 (×2): qty 1

## 2016-09-15 MED ORDER — HEPARIN (PORCINE) IN NACL 2-0.9 UNIT/ML-% IJ SOLN
INTRAMUSCULAR | Status: AC
  Filled 2016-09-15: qty 1000

## 2016-09-15 MED ORDER — SALINE SPRAY 0.65 % NA SOLN
1.0000 | NASAL | Status: DC | PRN
  Filled 2016-09-15: qty 44

## 2016-09-15 MED ORDER — HEPARIN (PORCINE) IN NACL 2-0.9 UNIT/ML-% IJ SOLN
INTRAMUSCULAR | Status: DC | PRN
  Administered 2016-09-15: 12:00:00

## 2016-09-15 MED ORDER — MORPHINE SULFATE (PF) 2 MG/ML IV SOLN
2.0000 mg | Freq: Once | INTRAVENOUS | Status: AC
  Administered 2016-09-15: 16:00:00 2 mg via INTRAVENOUS
  Filled 2016-09-15: qty 1

## 2016-09-15 MED ORDER — BIVALIRUDIN BOLUS VIA INFUSION - CUPID
INTRAVENOUS | Status: DC | PRN
  Administered 2016-09-15: 57.525 mg via INTRAVENOUS

## 2016-09-15 MED ORDER — LABETALOL HCL 5 MG/ML IV SOLN: 10.0000 mg | INTRAVENOUS | Status: AC | PRN

## 2016-09-15 MED ORDER — TICAGRELOR 90 MG PO TABS
ORAL_TABLET | ORAL | Status: AC
  Filled 2016-09-15: qty 1

## 2016-09-15 MED ORDER — SODIUM CHLORIDE 0.9% FLUSH: 3.0000 mL | INTRAVENOUS | Status: DC | PRN

## 2016-09-15 MED ORDER — IOPAMIDOL (ISOVUE-370) INJECTION 76%
INTRAVENOUS | Status: AC
  Filled 2016-09-15: qty 100

## 2016-09-15 MED ORDER — FENTANYL CITRATE (PF) 100 MCG/2ML IJ SOLN
INTRAMUSCULAR | Status: AC
  Filled 2016-09-15: qty 2

## 2016-09-15 MED ORDER — LIDOCAINE HCL (PF) 1 % IJ SOLN
INTRAMUSCULAR | Status: DC | PRN
  Administered 2016-09-15: 15 mL

## 2016-09-15 MED ORDER — ANGIOPLASTY BOOK
Freq: Once | Status: AC
  Administered 2016-09-15: 20:00:00
  Filled 2016-09-15: qty 1

## 2016-09-15 MED ORDER — ASPIRIN 81 MG PO CHEW
CHEWABLE_TABLET | ORAL | Status: AC
  Filled 2016-09-15: qty 4

## 2016-09-15 MED ORDER — SODIUM CHLORIDE 0.9 % IV SOLN
INTRAVENOUS | Status: DC | PRN
  Administered 2016-09-15 (×2): 1.75 mg/kg/h via INTRAVENOUS

## 2016-09-15 MED ORDER — HEPARIN (PORCINE) IN NACL 2-0.9 UNIT/ML-% IJ SOLN
INTRAMUSCULAR | Status: AC
  Filled 2016-09-15: qty 500

## 2016-09-15 MED ORDER — MIDAZOLAM HCL 2 MG/2ML IJ SOLN
INTRAMUSCULAR | Status: DC | PRN
Start: 2016-09-15 — End: 2016-09-15
  Administered 2016-09-15 (×2): 1 mg via INTRAVENOUS

## 2016-09-15 MED ORDER — IOPAMIDOL (ISOVUE-370) INJECTION 76%
INTRAVENOUS | Status: AC
  Filled 2016-09-15: qty 50

## 2016-09-15 MED ORDER — ASPIRIN 81 MG PO CHEW
CHEWABLE_TABLET | ORAL | Status: DC | PRN
  Administered 2016-09-15: 324 mg via ORAL

## 2016-09-15 MED ORDER — TICAGRELOR 90 MG PO TABS
90.0000 mg | ORAL_TABLET | Freq: Two times a day (BID) | ORAL | Status: DC
  Administered 2016-09-15 – 2016-09-16 (×2): 90 mg via ORAL
  Filled 2016-09-15 (×2): qty 1

## 2016-09-15 MED ORDER — FENTANYL CITRATE (PF) 100 MCG/2ML IJ SOLN
INTRAMUSCULAR | Status: DC | PRN
  Administered 2016-09-15 (×2): 25 ug via INTRAVENOUS

## 2016-09-15 MED ORDER — HEPARIN (PORCINE) IN NACL 2-0.9 UNIT/ML-% IJ SOLN
INTRAMUSCULAR | Status: DC | PRN
  Administered 2016-09-15: 1000 mL

## 2016-09-15 MED ORDER — TICAGRELOR 90 MG PO TABS
ORAL_TABLET | ORAL | Status: DC | PRN
  Administered 2016-09-15: 180 mg via ORAL

## 2016-09-15 MED ORDER — PROMETHAZINE HCL 25 MG/ML IJ SOLN
12.5000 mg | Freq: Four times a day (QID) | INTRAMUSCULAR | Status: DC | PRN
  Administered 2016-09-15: 18:00:00 12.5 mg via INTRAVENOUS
  Filled 2016-09-15: qty 1

## 2016-09-15 MED ORDER — NITROGLYCERIN 1 MG/10 ML FOR IR/CATH LAB
INTRA_ARTERIAL | Status: AC
  Filled 2016-09-15: qty 10

## 2016-09-15 SURGICAL SUPPLY — 20 items
BALLN EUPHORA RX 2.5X12 (BALLOONS) ×2
BALLN ~~LOC~~ EMERGE MR 3.5X12 (BALLOONS) ×2
BALLOON EUPHORA RX 2.5X12 (BALLOONS) ×1 IMPLANT
BALLOON ~~LOC~~ EMERGE MR 3.5X12 (BALLOONS) ×1 IMPLANT
CATH INFINITI 5FR MULTPACK ANG (CATHETERS) ×2 IMPLANT
CATH MICROCATH NAVVUS (MICROCATHETER) ×1 IMPLANT
GUIDE CATH RUNWAY 6FR VL3 (CATHETERS) ×2 IMPLANT
GUIDE CATH RUNWAY 6FR VL3.5 (CATHETERS) ×2 IMPLANT
GUIDEWIRE PRESSURE COMET II (WIRE) ×4 IMPLANT
KIT ENCORE 26 ADVANTAGE (KITS) ×2 IMPLANT
KIT HEART LEFT (KITS) ×2 IMPLANT
MICROCATHETER NAVVUS (MICROCATHETER) ×2
PACK CARDIAC CATHETERIZATION (CUSTOM PROCEDURE TRAY) ×2 IMPLANT
SHEATH PINNACLE 5F 10CM (SHEATH) ×2 IMPLANT
SHEATH PINNACLE 6F 10CM (SHEATH) ×2 IMPLANT
STENT XIENCE ALPINE RX 3.0X15 (Permanent Stent) ×2 IMPLANT
SYR MEDRAD MARK V 150ML (SYRINGE) ×2 IMPLANT
TRANSDUCER W/STOPCOCK (MISCELLANEOUS) ×2 IMPLANT
WIRE EMERALD 3MM-J .035X150CM (WIRE) ×2 IMPLANT
WIRE RUNTHROUGH .014X180CM (WIRE) ×2 IMPLANT

## 2016-09-15 NOTE — Care Management Note (Addendum)
Case Management Note  Patient Details  Name: [name] [name] MRN: [id number] Date of Birth: [birth year]  Subjective/Objective:   Presents with NQWMI, plan for cath, NCM gave patient the 30 day savings card and the pharmacy she goes to has it in stock.  NCM will cont to follow for dc needs.                Action/Plan:   Expected Discharge Date:                  Expected Discharge Plan:     In-House Referral:     Discharge planning Services  CM Consult  Post Acute Care Choice:    Choice offered to:     DME Arranged:    DME Agency:     HH Arranged:    HH Agency:     Status of Service:  In process, will continue to follow  If discussed at Long Length of Stay Meetings, dates discussed:    Additional Comments:  [name], [name], RN 09/15/2016, 6:47 AM

## 2016-09-15 NOTE — Interval H&P Note (Signed)
Cath Lab Visit (complete for each Cath Lab visit)  Clinical Evaluation Leading to the Procedure:   ACS: Yes.    Non-ACS:    Anginal Classification: CCS IV  Anti-ischemic medical therapy: Maximal Therapy (2 or more classes of medications)  Non-Invasive Test Results: No non-invasive testing performed  Prior CABG: No previous CABG      History and Physical Interval Note:  09/15/2016 10:23 AM  [name] HatchJanice P [name]  has presented today for surgery, with the diagnosis of cp  The various methods of treatment have been discussed with the patient and family. After consideration of risks, benefits and other options for treatment, the patient has consented to  Procedure(s): Left Heart Cath and Coronary Angiography (N/A) as a surgical intervention .  The patient's history has been reviewed, patient examined, no change in status, stable for surgery.  I have reviewed the patient's chart and labs.  Questions were answered to the patient's satisfaction.     Rinaldo CloudHarwani, Selma Rodelo

## 2016-09-15 NOTE — Progress Notes (Signed)
Patient c/o SOB at around 1430.  Assessment wnl.  Probably Brilinta related.  Encouraged limited intact of Pepsi (pre sheath pull) for relief.  Throughout the afternoon patient nauseous with small amts of emesis.  Patient also reporting feeling flushed, and that the room was spinning.  Dr. Sharyn LullHarwani stopped by to see patient.  Patient also given Zofran, and then phenergan.  By [id number], patient feeling significantly better.  She is not sure that she will be able to continue on Brilinta secondary to side effects.

## 2016-09-15 NOTE — Progress Notes (Signed)
S/W VANESSA @ [facility] TE[facility] ADV # [id number] OPT- 2    BRILINTA 90 MG BID   COVER- YES  CO-PAY- $ 45.00  TIER- 4 DRUG  PRIOR APPROVAL- NO   PHARMACY : ANY RETAIL

## 2016-09-15 NOTE — Progress Notes (Signed)
ANTICOAGULATION CONSULT NOTE - Follow Up Consult  Pharmacy Consult for heparin Indication: [id number]  Labs:  Recent Labs  09/14/16 1427 09/14/16 2217 09/15/16 0212  HGB 13.0 12.3  --   HCT 39.1 37.2  --   PLT 249 243  --   APTT  --  68*  --   LABPROT  --  13.6  --   INR  --  1.03  --   HEPARINUNFRC  --   --  0.24*  CREATININE 0.72 0.76  --   TROPONINI 0.14* 0.17*  --     Assessment: 71yo female subtherapeutic on heparin with initial dosing for ACS.  Goal of Therapy:  Heparin level 0.3-0.7 units/ml   Plan:  Will increase heparin gtt by 2 units/kg/hr to 1000 units/hr and check level in 6hr.  [name] [name], PharmD, BCPS  09/15/2016,3:00 AM

## 2016-09-15 NOTE — Progress Notes (Signed)
Subjective:  Denies any chest pain and states had difficulty with breathing after taking Brilinta.right femoral sheath is now being pulled out.  Right groin stable   Objective:  Vital Signs in the last 24 hours: Temp:  [97.5 F (36.4 C)-98.6 F (37 C)] 98.2 F (36.8 C) (03/01 1541) Pulse Rate:  [68-92] 72 (03/01 1541) Resp:  [11-27] 18 (03/01 1541) BP: (135-177)/(68-104) 146/81 (03/01 1541) SpO2:  [90 %-100 %] 100 % (03/01 1541) Weight:  [168 lb 10.4 oz (76.5 kg)-169 lb 1.5 oz (76.7 kg)] 169 lb 1.5 oz (76.7 kg) (03/01 0615)  Intake/Output from previous day: 02/28 0701 - 03/01 0700 In: 985 [P.O.:240; I.V.:745] Out: 1200 [Urine:1200] Intake/Output from this shift: Total I/O In: 0  Out: 1975 [Urine:1975]  Physical Exam: unchanged  Lab Results:  Recent Labs  09/14/16 2217 09/15/16 0437  WBC 6.7 5.8  HGB 12.3 11.9*  PLT 243 242    Recent Labs  09/14/16 2217 09/15/16 0437  NA 139 141  K 3.7 3.9  CL 104 110  CO2 26 25  GLUCOSE 121* 99  BUN 9 10  CREATININE 0.76 0.76    Recent Labs  09/15/16 0437 09/15/16 0931  TROPONINI 0.16* 0.14*   Hepatic Function Panel  Recent Labs  09/14/16 2217  PROT 6.2*  ALBUMIN 3.4*  AST 19  ALT 20  ALKPHOS 50  BILITOT 0.4    Recent Labs  09/15/16 0437  CHOL 216*   No results for input(s): PROTIME in the last 72 hours.  Imaging: Imaging results have been reviewed and Dg Chest 2 View  Result Date: 09/14/2016 CLINICAL DATA:  Bilateral jaw, neck, and shoulder pain with left arm tingling since Sunday. Chest pain and weakness beginning yesterday. EXAM: CHEST  2 VIEW COMPARISON:  Two-view chest x-ray 09/14/2012 FINDINGS: The heart size is normal. Chronic interstitial coarsening is present. There is slight increase in a diffuse interstitial pattern. No significant airspace consolidation is present. There are no effusions. Lumbar fusion is noted. IMPRESSION: 1. Mild interstitial pattern superimposed on chronic coarsening. This  may represent mild edema. 2. No focal airspace consolidation or effusion. 3. Lumbar fusion. Electronically Signed   By: Marin Robertshristopher  Mattern M.D.   On: 09/14/2016 15:33    Cardiac Studies:  Assessment/Plan:  Acute small non-Q-wave myocardial infarction.status post left cardiac catheterization/FFR/PTCA stenting to proximal LAD Hypertension Hyperlipidemia Morbid obesity GERD history of migraine headache Strong family history of coronary artery disease Plan Continue present management.   LOS: 1 day    Rinaldo CloudHarwani, Jhaniya Briski 09/15/2016, 5:19 PM

## 2016-09-16 LAB — CBC
HCT: 35.8 % — ABNORMAL LOW (ref 36.0–46.0)
Hemoglobin: 12 g/dL (ref 12.0–15.0)
MCH: 28.6 pg (ref 26.0–34.0)
MCHC: 33.5 g/dL (ref 30.0–36.0)
MCV: 85.2 fL (ref 78.0–100.0)
Platelets: 251 10*3/uL (ref 150–400)
RBC: 4.2 MIL/uL (ref 3.87–5.11)
RDW: 13.8 % (ref 11.5–15.5)
WBC: 7.9 10*3/uL (ref 4.0–10.5)

## 2016-09-16 LAB — BASIC METABOLIC PANEL
Anion gap: 6 (ref 5–15)
BUN: 12 mg/dL (ref 6–20)
CALCIUM: 9.4 mg/dL (ref 8.9–10.3)
CO2: 22 mmol/L (ref 22–32)
Chloride: 110 mmol/L (ref 101–111)
Creatinine, Ser: 0.86 mg/dL (ref 0.44–1.00)
GFR calc Af Amer: >60 mL/min (ref >60)
GLUCOSE: 107 mg/dL — AB (ref 65–99)
POTASSIUM: 3.6 mmol/L (ref 3.5–5.1)
SODIUM: 138 mmol/L (ref 135–145)

## 2016-09-16 MED ORDER — METOPROLOL TARTRATE 25 MG PO TABS: 12.5000 mg | ORAL_TABLET | Freq: Two times a day (BID) | ORAL | 3 refills | Status: DC

## 2016-09-16 MED ORDER — ATORVASTATIN CALCIUM 20 MG PO TABS: 20.0000 mg | ORAL_TABLET | Freq: Every day | ORAL | 3 refills | Status: DC

## 2016-09-16 MED ORDER — TICAGRELOR 90 MG PO TABS: 90.0000 mg | ORAL_TABLET | Freq: Two times a day (BID) | ORAL | 3 refills | Status: DC

## 2016-09-16 MED ORDER — NITROGLYCERIN 0.4 MG SL SUBL: 0.4000 mg | SUBLINGUAL_TABLET | SUBLINGUAL | 12 refills | Status: DC | PRN

## 2016-09-16 NOTE — Progress Notes (Signed)
CARDIAC REHAB PHASE I   PRE:  Rate/Rhythm: 83 SR  BP:  Supine:   Sitting: 121/63  Standing:    SaO2: 97%RA  MODE:  Ambulation: 800 ft   POST:  Rate/Rhythm: 102 ST  BP:  Supine:   Sitting: 158/80  Standing:    SaO2: 99%RA 0755-0925 Pt walked 800 ft on RA with steady gait. No CP but some SOB. Tolerated well. Stressed importance of brilinta with stent. Reviewed MI restrictions, heart healthy diet, ex ed, NTG use, risk factors, and CRP 2. Will refer to [place] program. Pt voiced understanding.   Luetta Nuttingharlene Zong Mcquarrie, RN BSN  09/16/2016 9:20 AM

## 2016-09-16 NOTE — Discharge Instructions (Signed)
° °Acute Coronary Syndrome °Acute coronary syndrome (ACS) is a serious problem in which there is suddenly not enough blood and oxygen supplied to the heart. ACS may mean that one or more of the blood vessels in your heart (coronary arteries) may be blocked. ACS can result in chest pain or a heart attack (myocardial infarction or MI). °What are the causes? °This condition is caused by atherosclerosis, which is the buildup of fat and cholesterol (plaque) on the inside of the arteries. Over time, the plaque may narrow or block the artery, and this will lessen blood flow to the heart. Plaque can also become weak and break off within a coronary artery to form a clot and cause a sudden blockage. °What increases the risk? °The risk factors of this condition include: °· High cholesterol levels. °· High blood pressure (hypertension). °· Smoking. °· Diabetes. °· Age. °· Family history of chest pain, heart disease, or stroke. °· Lack of exercise. ° °What are the signs or symptoms? °The most common signs of this condition include: °· Chest pain, which can be: °? A crushing or squeezing in the chest. °? A tightness, pressure, fullness, or heaviness in the chest. °? Present for more than a few minutes, or it can stop and recur. °· Pain in the arms, neck, jaw, or back. °· Unexplained heartburn or indigestion. °· Shortness of breath. °· Nausea. °· Sudden cold sweats. °· Feeling light-headed or dizzy. ° °Sometimes, this condition has no symptoms. °How is this diagnosed? °ACS may be diagnosed through the following tests: °· Electrocardiogram (ECG). °· Blood tests. °· Coronary angiogram. This is a procedure to look at the coronary arteries to see if there is any blockage. ° °How is this treated? °Treatment for ACS may include: °· Healthy behavioral changes to reduce or control risk factors. °· Medicine. °· Coronary stenting. A stent helps to keep an artery open. °· Coronary angioplasty. This procedure widens a narrowed or blocked  artery. °· Coronary artery bypass surgery. This will allow your blood to pass the blockage (bypass) to reach your heart. ° °Follow these instructions at home: °Eating and drinking °· Follow a heart-healthy diet. A dietitian can you help to educate you about healthy food options and changes. °· Use healthy cooking methods such as roasting, grilling, broiling, baking, poaching, steaming, or stir-frying. Talk to a dietitian to learn more about healthy cooking methods. °Medicines °· Take medicines only as directed by your health care provider. °· Do not take the following medicines unless your health care provider approves: °? Nonsteroidal anti-inflammatory drugs (NSAIDs), such as ibuprofen, naproxen, or celecoxib. °? Vitamin supplements that contain vitamin A, vitamin E, or both. °? Hormone replacement therapy that contains estrogen with or without progestin. °· Stop illegal drug use. °Activity °· Follow an exercise program that is approved by your health care provider. °· Plan rest periods when you are fatigued. °Lifestyle °· Do not use any tobacco products, including cigarettes, chewing tobacco, or electronic cigarettes. If you need help quitting, ask your health care provider. °· If you drink alcohol, and your health care provider approves, limit your alcohol intake to no more than 1 drink per day. One drink equals 12 ounces of beer, 5 ounces of wine, or 1½ ounces of hard liquor. °· Learn to manage stress. °· Maintain a healthy weight. Lose weight as approved by your health care provider. °General instructions °· Manage other health conditions, such as hypertension and diabetes, as directed by your health care provider. °· Keep all follow-up   visits as directed by your health care provider. This is important. °· Your health care provider may ask you to monitor your blood pressure. A blood pressure reading consists of a higher number over a lower number, such as 110 over 72, written as 110/72. Ideally, your blood  pressure should be: °? Below 140/90 if you have no other medical conditions. °? Below 130/80 if you have diabetes or kidney disease. °Get help right away if: °· You have pain in your chest, neck, arm, jaw, stomach, or back that lasts more than a few minutes, is recurring, or is not relieved by taking medicine under your tongue (sublingual nitroglycerin). °· You have profuse sweating without cause. °· You have unexplained: °? Heartburn or indigestion. °? Shortness of breath or difficulty breathing. °? Nausea or vomiting. °? Fatigue. °? Feelings of nervousness or anxiety. °? Weakness. °? Diarrhea. °· You have sudden light-headedness or dizziness. °· You faint. °These symptoms may represent a serious problem that is an emergency. Do not wait to see if the symptoms will go away. Get medical help right away. Call your local emergency services (911 in the [place] Do not drive yourself to the clinic or hospital. °This information is not intended to replace advice given to you by your health care provider. Make sure you discuss any questions you have with your health care provider. °Document Released: 07/04/2005 Document Revised: 12/16/2015 Document Reviewed: 11/05/2013 °Elsevier Interactive Patient Education © 2017 [employer] °Coronary Angiogram With Stent °Coronary angiogram with stent placement is a procedure to widen or open a narrow blood vessel of the heart (coronary artery). Arteries may become blocked by cholesterol buildup (plaques) in the lining or wall. When a coronary artery becomes partially blocked, blood flow to that area decreases. This may lead to chest pain or a heart attack (myocardial infarction). °A stent is a small piece of metal that looks like mesh or a spring. Stent placement may be done as treatment for a heart attack or right after a coronary angiogram in which a blocked artery is found. °Let your health care provider know about: °· Any allergies you have. °· All medicines you are taking,  including vitamins, herbs, eye drops, creams, and over-the-counter medicines. °· Any problems you or family members have had with anesthetic medicines. °· Any blood disorders you have. °· Any surgeries you have had. °· Any medical conditions you have. °· Whether you are pregnant or may be pregnant. °What are the risks? °Generally, this is a safe procedure. However, problems may occur, including: °· Damage to the heart or its blood vessels. °· A return of blockage. °· Bleeding, infection, or bruising at the insertion site. °· A collection of blood under the skin (hematoma) at the insertion site. °· A blood clot in another part of the body. °· Kidney injury. °· Allergic reaction to the dye or contrast that is used. °· Bleeding into the abdomen (retroperitoneal bleeding). ° °What happens before the procedure? °Staying hydrated °Follow instructions from your health care provider about hydration, which may include: °· Up to 2 hours before the procedure - you may continue to drink clear liquids, such as water, clear fruit juice, black coffee, and plain tea. ° °Eating and drinking restrictions °Follow instructions from your health care provider about eating and drinking, which may include: °· 8 hours before the procedure - stop eating heavy meals or foods such as meat, fried foods, or fatty foods. °· 6 hours before the procedure - stop eating light meals or   foods, such as toast or cereal. °· 2 hours before the procedure - stop drinking clear liquids. ° °Ask your health care provider about: °· Changing or stopping your regular medicines. This is especially important if you are taking diabetes medicines or blood thinners. °· Taking medicines such as ibuprofen. These medicines can thin your blood. Do not take these medicines before your procedure if your health care provider instructs you not to. Generally, aspirin is recommended before a procedure of passing a small, thin tube (catheter) through a blood vessel and into the  heart (cardiac catheterization). ° °What happens during the procedure? °· An IV tube will be inserted into one of your veins. °· You will be given one or more of the following: °? A medicine to help you relax (sedative). °? A medicine to numb the area where the catheter will be inserted into an artery (local anesthetic). °· To reduce your risk of infection: °? Your health care team will wash or sanitize their hands. °? Your skin will be washed with soap. °? Hair may be removed from the area where the catheter will be inserted. °· Using a guide wire, the catheter will be inserted into an artery. The location may be in your groin, in your wrist, or in the fold of your arm (near your elbow). °· A type of X-ray (fluoroscopy) will be used to help guide the catheter to the opening of the arteries in the heart. °· A dye will be injected into the catheter, and X-rays will be taken. The dye will help to show where any narrowing or blockages are located in the arteries. °· A tiny wire will be guided to the blocked spot, and a balloon will be inflated to make the artery wider. °· The stent will be expanded and will crush the plaques into the wall of the vessel. The stent will hold the area open and improve the blood flow. Most stents have a drug coating to reduce the risk of the stent narrowing over time. °· The artery may be made wider using a drill, laser, or other tools to remove plaques. °· When the blood flow is better, the catheter will be removed. The lining of the artery will grow over the stent, which stays where it was placed. °This procedure may vary among health care providers and hospitals. °What happens after the procedure? °· If the procedure is done through the leg, you will be kept in bed lying flat for about 6 hours. You will be instructed to not bend and not cross your legs. °· The insertion site will be checked frequently. °· The pulse in your foot or wrist will be checked frequently. °· You may have  additional blood tests, X-rays, and a test that records the electrical activity of your heart (electrocardiogram, or ECG). °This information is not intended to replace advice given to you by your health care provider. Make sure you discuss any questions you have with your health care provider. °Document Released: 01/08/2003 Document Revised: 03/03/2016 Document Reviewed: 02/07/2016 °Elsevier Interactive Patient Education © 2017 Else[employer] Inc. ° °

## 2016-09-16 NOTE — Discharge Summary (Signed)
Discharge summary dictated on 09/16/2016, dictation number is [phone]

## 2016-09-17 NOTE — Discharge Summary (Signed)
[name] [name], [name] [name] NO.:  [id number]  MEDICAL RECORD NO.:  [id number]  LOCATION:  6C06C                        FACILITY:  [facility]  PHYSICIAN:  [name] [name], M.D. DATE OF BIRTH:  1946-05-06  DATE OF ADMISSION:  09/14/2016 DATE OF DISCHARGE:  09/16/2016                              DISCHARGE SUMMARY   ADMITTING DIAGNOSES: 1. Acute small non-Q-wave myocardial infarction. 2. Hypertension. 3. Hyperlipidemia. 4. Morbid obesity. 5. Gastroesophageal reflux disease. 6. History of migraine headache. 7. Strong family history of coronary artery disease.  DISCHARGE DIAGNOSES: 1. Status post small acute non-Q-wave myocardial infarction, status     post left cardiac catheterization/fractional flow reserve to LAD     followed by PTCA stenting to proximal LAD. 2. Hypertension. 3. Hyperlipidemia. 4. Morbid obesity. 5. Gastroesophageal reflux disease. 6. History of migraine headache. 7. Strong family history of coronary artery disease.  DISCHARGE HOME MEDICATIONS: 1. Atorvastatin 20 mg 1 tablet daily. 2. Metoprolol tartrate 25 mg half tablet twice daily. 3. Nitrostat sublingual p.r.n. 4. Brilinta 90 mg 1 tablet twice daily. 5. Tylenol 500 mg every 6 hours as needed. 6. Aspirin 81 mg 1 tablet daily. 7. Lisinopril 20 mg daily. 8. Nortriptyline 25 mg 1 tablet daily at night. 9. Omeprazole 20 mg 1 tablet daily. 10.Systane eye drops as before. The patient has been advised to stop meloxicam and Allegra D but may take Allegra plain 180 mg p.r.n. as needed.  DIET:  Low salt, low cholesterol.  ACTIVITY:  Increase activity slowly as tolerated.  Post cardiac cath/PTCA stent instructions have been given.  FOLLOWUP:  Follow up with me in 1 week.  Follow up with PMD as scheduled.  The patient will be scheduled for phase 2 cardiac rehab as outpatient.  CONDITION AT DISCHARGE:  Stable.  BRIEF HISTORY AND HOSPITAL COURSE:  [name] [name] is a 71 year old female with past  medical history significant for hypertension, hyperlipidemia, history of migraine headaches, GERD, asthma, strong family history of coronary artery disease, her 3 maternal uncles had MI in their 40s.  She came to the ER complaining of jaw and neck pain, initially started Sunday morning and later on had chest pressure described as heaviness grade 4/5, did not seek any medical attention.  Next day felt weak associated with shortness of breath and again this morning had chest pressure, decided to go to PMD's office.  Received 4 sublingual nitro and aspirin with relief of chest pain.  EKG showed normal sinus rhythm with LVH and nonspecific T-wave changes.  The patient was noted to have minimally elevated troponin I.  when seen in the, ED the patient denies any chest pain.  Denies such episodes of chest pain in the past.  PAST MEDICAL HISTORY:  As above.  PHYSICAL EXAMINATION:  GENERAL:  She was alert, awake, oriented x3. VITAL SIGNS:  Blood pressure was 116/72, pulse 92, she was afebrile. HEENT:  Conjunctivae were pink. NECK:  Supple.  No JVD.  No bruit. LUNGS:  Clear to auscultation without rhonchi or rales. CARDIOVASCULAR:  S1 and S2 were normal.  There were soft systolic murmur and S4 gallop. ABDOMEN:  Soft.  Bowel sounds were present.  Nontender. EXTREMITIES:  There was  no clubbing, cyanosis, or edema.  LABORATORY DATA:  Sodium was 141, potassium 3.6, BUN 11, creatinine 0.72, glucose was 98.  Hemoglobin was 13, hematocrit 39.1, white count of 6.8.  Cholesterol was 216, HDL 48, LDL was 143, triglycerides 124. Troponin I was minimally elevated 0.14, 0.17, 0.16, last troponin is trending down 0.14.  Last electrolytes:  Sodium 138, potassium 3.6, glucose was 107, BUN 12, creatinine 0.86.  Hemoglobin 12, hematocrit 35.8, white count of 7.9, which has been stable.  Repeat EKG showed normal sinus rhythm with poor R-wave progression in the inferior leads. EKG this morning showed normal sinus  rhythm with nonspecific T-wave changes.  R-wave progression has improved in inferior leads compared to prior EKG.  BRIEF HOSPITAL COURSE:  The patient was admitted to step-down unit.  The patient ruled in for a very small non-Q-wave myocardial infarction due to elevated enzymes and typical anginal chest pain.  The patient subsequently underwent yesterday a left cardiac catheterization/fractional flow reserve to LAD and PTCA stenting to proximal LAD as per procedure report.  The patient tolerated the procedure well.  Postprocedure, the patient did not have any episodes of anginal chest pain.  Her groin is stable with no evidence of hematoma or bruit.  The patient ambulated in the hallway without any problems earlier this morning.  The patient will be discharged home on above medications and will be followed up in my office in 1 week.  The patient will be scheduled for phase 2 cardiac rehab as outpatient.  The patient has been discussed at length regarding lifestyle changes, diet, and compliance with medication to which she agrees.    [name] [name], M.D.    [facility]  D:  09/16/2016  T:  09/17/2016  Job:  [id number]

## 2016-09-20 ENCOUNTER — Telehealth (HOSPITAL_COMMUNITY): Payer: Self-pay | Admitting: Family Medicine

## 2016-09-20 NOTE — Telephone Encounter (Signed)
Patient insurance benefits verified. Patient has [employer]: $15.00 co-payment, no deductible, out of pocket $3400/$0 has been met, no pre-cert and no limit on visit. Spoke with Lashanda/reference # [id number].

## 2016-09-22 ENCOUNTER — Telehealth (HOSPITAL_COMMUNITY): Payer: Self-pay | Admitting: Family Medicine

## 2016-09-22 NOTE — Telephone Encounter (Signed)
Received faxed physician order signed by Dr. Sharyn LullHarwani for patient to participate in Cardiac Rehab program. Information left on Joanne's desk to follow up and schedule appointment.

## 2016-09-23 DIAGNOSIS — I10 Essential (primary) hypertension: Secondary | ICD-10-CM | POA: Diagnosis not present

## 2016-09-23 DIAGNOSIS — E785 Hyperlipidemia, unspecified: Secondary | ICD-10-CM | POA: Diagnosis not present

## 2016-09-23 DIAGNOSIS — I2510 Atherosclerotic heart disease of native coronary artery without angina pectoris: Secondary | ICD-10-CM | POA: Diagnosis not present

## 2016-09-23 DIAGNOSIS — I214 Non-ST elevation (NSTEMI) myocardial infarction: Secondary | ICD-10-CM | POA: Diagnosis not present

## 2016-09-26 DIAGNOSIS — I214 Non-ST elevation (NSTEMI) myocardial infarction: Secondary | ICD-10-CM | POA: Diagnosis not present

## 2016-09-26 DIAGNOSIS — Z955 Presence of coronary angioplasty implant and graft: Secondary | ICD-10-CM | POA: Diagnosis not present

## 2016-09-26 DIAGNOSIS — I10 Essential (primary) hypertension: Secondary | ICD-10-CM | POA: Diagnosis not present

## 2016-09-26 DIAGNOSIS — E7800 Pure hypercholesterolemia, unspecified: Secondary | ICD-10-CM | POA: Diagnosis not present

## 2016-10-13 ENCOUNTER — Telehealth (HOSPITAL_COMMUNITY): Payer: Self-pay | Admitting: Family Medicine

## 2016-10-13 NOTE — Telephone Encounter (Signed)
Left message with patient husband Onalee Hua(David) to call me about scheduling appointment for Cardiac Rehab. Patient husband given my contact information for patient to return call.

## 2016-11-02 ENCOUNTER — Telehealth (HOSPITAL_COMMUNITY): Payer: Self-pay | Admitting: Family Medicine

## 2016-11-02 NOTE — Telephone Encounter (Signed)
S/w Levada Dy with [employer] verifying insurance benefits, Pt has met $570.02 out of $3400.00 Out of Pocket... Reference #[id number]... KJ

## 2016-11-03 ENCOUNTER — Encounter (HOSPITAL_COMMUNITY)
Admission: RE | Admit: 2016-11-03 | Discharge: 2016-11-03 | Disposition: A | Discharge status: Home / Self Care | Payer: PPO | Source: Ambulatory Visit | Attending: Cardiology | Admitting: Cardiology

## 2016-11-03 ENCOUNTER — Ambulatory Visit (HOSPITAL_COMMUNITY): Payer: PPO

## 2016-11-03 ENCOUNTER — Encounter (HOSPITAL_COMMUNITY): Payer: Self-pay

## 2016-11-03 VITALS — BP 100/70 | HR 73 | Ht 65.25 in | Wt 161.4 lb

## 2016-11-03 DIAGNOSIS — I10 Essential (primary) hypertension: Secondary | ICD-10-CM | POA: Diagnosis not present

## 2016-11-03 DIAGNOSIS — K219 Gastro-esophageal reflux disease without esophagitis: Secondary | ICD-10-CM | POA: Diagnosis not present

## 2016-11-03 DIAGNOSIS — Z955 Presence of coronary angioplasty implant and graft: Secondary | ICD-10-CM

## 2016-11-03 DIAGNOSIS — I214 Non-ST elevation (NSTEMI) myocardial infarction: Secondary | ICD-10-CM

## 2016-11-03 DIAGNOSIS — M1990 Unspecified osteoarthritis, unspecified site: Secondary | ICD-10-CM | POA: Diagnosis not present

## 2016-11-03 DIAGNOSIS — I2510 Atherosclerotic heart disease of native coronary artery without angina pectoris: Secondary | ICD-10-CM | POA: Insufficient documentation

## 2016-11-03 DIAGNOSIS — E7800 Pure hypercholesterolemia, unspecified: Secondary | ICD-10-CM | POA: Insufficient documentation

## 2016-11-03 DIAGNOSIS — Z79899 Other long term (current) drug therapy: Secondary | ICD-10-CM | POA: Insufficient documentation

## 2016-11-03 DIAGNOSIS — Z7982 Long term (current) use of aspirin: Secondary | ICD-10-CM | POA: Diagnosis not present

## 2016-11-03 NOTE — Progress Notes (Signed)
Cardiac Individual Treatment Plan  Patient Details  Name: [name] [name] MRN: [id number] Date of Birth: 1945/11/25 Referring Provider:     CARDIAC REHAB PHASE II ORIENTATION from 11/03/2016 in [facility] CARDIAC REHAB  Referring Provider  Rinaldo Cloud MD      Initial Encounter Date:    CARDIAC REHAB PHASE II ORIENTATION from 11/03/2016 in [facility] CARDIAC REHAB  Date  11/03/16  Referring Provider  Rinaldo Cloud MD      Visit Diagnosis: 09/15/16 Status post coronary artery stent placement DES ost LAD  Acute non Q wave MI (myocardial infarction), initial episode of care [facility])  Patient's Home Medications on Admission:  Current Outpatient Prescriptions:  .  acetaminophen (TYLENOL) 500 MG tablet, Take 500 mg by mouth every 6 (six) hours as needed for mild pain., Disp: , Rfl:  .  aspirin 81 MG chewable tablet, Chew 81 mg by mouth daily., Disp: , Rfl:  .  docusate sodium (COLACE) 100 MG capsule, Take 100 mg by mouth 2 (two) times daily., Disp: , Rfl:  .  fexofenadine (ALLEGRA) 180 MG tablet, Take 180 mg by mouth daily., Disp: , Rfl:  .  lisinopril (PRINIVIL,ZESTRIL) 20 MG tablet, Take 20 mg by mouth daily., Disp: , Rfl:  .  metoprolol tartrate (LOPRESSOR) 25 MG tablet, Take 0.5 tablets (12.5 mg total) by mouth 2 (two) times daily., Disp: 30 tablet, Rfl: 3 .  Multiple Vitamin (MULTIVITAMIN WITH MINERALS) TABS tablet, Take 1 tablet by mouth daily. centrum silver, Disp: , Rfl:  .  nitroGLYCERIN (NITROSTAT) 0.4 MG SL tablet, Place 1 tablet (0.4 mg total) under the tongue every 5 (five) minutes x 3 doses as needed for chest pain., Disp: 25 tablet, Rfl: 12 .  nortriptyline (PAMELOR) 25 MG capsule, Take 25 mg by mouth at bedtime. , Disp: , Rfl:  .  Polyethyl Glycol-Propyl Glycol (SYSTANE OP), Place 1 drop into both eyes daily as needed. For dry eyes, Disp: , Rfl:  .  ticagrelor (BRILINTA) 90 MG TABS tablet, Take 1 tablet (90 mg total) by mouth 2 (two) times  daily., Disp: 60 tablet, Rfl: 3 .  atorvastatin (LIPITOR) 20 MG tablet, Take 1 tablet (20 mg total) by mouth daily at 6 PM. (Patient not taking: Reported on 11/03/2016), Disp: 30 tablet, Rfl: 3  Past Medical History: Past Medical History:  Diagnosis Date  . Arthritis    "knees, hips, hands, back" (09/15/2016)  . Constipation   . Coronary artery disease   . Exercise-induced asthma   . Family history of adverse reaction to anesthesia    "daughter gets PONV too"  . GERD (gastroesophageal reflux disease)   . Heart murmur dx'd 09/14/2016  . High cholesterol   . Hypertension   . Migraine    "recently had series of injections; none since; they had been weekly 1 month ago" (09/15/2016)  . Necrotizing fasciitis (HCC)    R index finger- had a amp  . NSTEMI (non-ST elevated myocardial infarction) (HCC)   . Pneumonia ~ 2003  . PONV (postoperative nausea and vomiting)    Patch helps  . RSD (reflex sympathetic dystrophy)    Right arm after surgery    Tobacco Use: History  Smoking Status  . Never Smoker  Smokeless Tobacco  . Never Used    Labs: Recent Review Flowsheet Data    Labs for ITP Cardiac and Pulmonary Rehab Latest Ref Rng & Units 09/15/2016   Cholestrol 0 - 200 mg/dL 782(N)   LDLCALC  0 - 99 mg/dL 960(A)   HDL >54 mg/dL 48   Trlycerides <098 mg/dL 119      Capillary Blood Glucose: No results found for: GLUCAP   Exercise Target Goals: Date: 11/03/16  Exercise Program Goal: Individual exercise prescription set with THRR, safety & activity barriers. Participant demonstrates ability to understand and report RPE using BORG scale, to self-measure pulse accurately, and to acknowledge the importance of the exercise prescription.  Exercise Prescription Goal: Starting with aerobic activity 30 plus minutes a day, 3 days per week for initial exercise prescription. Provide home exercise prescription and guidelines that participant acknowledges understanding prior to  discharge.  Activity Barriers & Risk Stratification:     Activity Barriers & Cardiac Risk Stratification - 11/03/16 1456      Activity Barriers & Cardiac Risk Stratification   Activity Barriers Back Problems;Muscular Weakness;Deconditioning;Other (comment)   Comments R>L knee pain, back surgery in 2014   Cardiac Risk Stratification High      6 Minute Walk:     6 Minute Walk    Row Name 11/03/16 1434 11/03/16 1535       6 Minute Walk   Phase Initial  -    Distance 1400 feet  -    Walk Time 6 minutes  -    # of Rest Breaks 0  -    MPH 2.65  -    METS 3.1  -    RPE 12  -    VO2 Peak 10.8  -    Symptoms Yes (comment)  -    Comments c/o fatigue at end of walk test  -    Resting HR 73 bpm  -    Resting BP 100/70  -    Max Ex. HR 100 bpm  -    Max Ex. BP 138/80  -    2 Minute Post BP  - 132/78       Oxygen Initial Assessment:   Oxygen Re-Evaluation:   Oxygen Discharge (Final Oxygen Re-Evaluation):   Initial Exercise Prescription:     Initial Exercise Prescription - 11/03/16 1500      Date of Initial Exercise RX and Referring Provider   Date 11/03/16   Referring Provider Rinaldo Cloud MD     Treadmill   MPH 2.2   Grade 1   Minutes 10   METs 2.99     Recumbant Bike   Level 1.5   Minutes 10   METs 2     NuStep   Level 3   SPM 70   Minutes 10   METs 2     Prescription Details   Frequency (times per week) 3   Duration Progress to 30 minutes of continuous aerobic without signs/symptoms of physical distress     Intensity   THRR 40-80% of Max Heartrate 60-119   Ratings of Perceived Exertion 11-13   Perceived Dyspnea 0-4     Progression   Progression Continue to progress workloads to maintain intensity without signs/symptoms of physical distress.     Resistance Training   Training Prescription Yes   Weight 2lbs   Reps 10-15      Perform Capillary Blood Glucose checks as needed.  Exercise Prescription Changes:   Exercise  Comments:   Exercise Goals and Review:     Exercise Goals    Row Name 11/03/16 1456             Exercise Goals   Increase Physical Activity Yes  Intervention Provide advice, education, support and counseling about physical activity/exercise needs.;Develop an individualized exercise prescription for aerobic and resistive training based on initial evaluation findings, risk stratification, comorbidities and participant's personal goals.       Expected Outcomes Achievement of increased cardiorespiratory fitness and enhanced flexibility, muscular endurance and strength shown through measurements of functional capacity and personal statement of participant.       Increase Strength and Stamina Yes       Intervention Provide advice, education, support and counseling about physical activity/exercise needs.;Develop an individualized exercise prescription for aerobic and resistive training based on initial evaluation findings, risk stratification, comorbidities and participant's personal goals.       Expected Outcomes Achievement of increased cardiorespiratory fitness and enhanced flexibility, muscular endurance and strength shown through measurements of functional capacity and personal statement of participant.          Exercise Goals Re-Evaluation :    Discharge Exercise Prescription (Final Exercise Prescription Changes):   Nutrition:  Target Goals: Understanding of nutrition guidelines, daily intake of sodium 1500mg , cholesterol 200mg , calories 30% from fat and 7% or less from saturated fats, daily to have 5 or more servings of fruits and vegetables.  Biometrics:     Pre Biometrics - 11/03/16 1553      Pre Biometrics   Weight 161 lb 6 oz (73.2 kg)       Nutrition Therapy Plan and Nutrition Goals:     Nutrition Therapy & Goals - 11/03/16 1419      Nutrition Therapy   Diet Therapeutic Lifestyle Changes     Intervention Plan   Intervention Prescribe, educate and  counsel regarding individualized specific dietary modifications aiming towards targeted core components such as weight, hypertension, lipid management, diabetes, heart failure and other comorbidities.;Nutrition handout(s) given to patient.  Constipation Nutrition Therapy   Expected Outcomes Short Term Goal: Understand basic principles of dietary content, such as calories, fat, sodium, cholesterol and nutrients.;Long Term Goal: Adherence to prescribed nutrition plan.      Nutrition Discharge: Nutrition Scores:   Nutrition Goals Re-Evaluation:   Nutrition Goals Re-Evaluation:   Nutrition Goals Discharge (Final Nutrition Goals Re-Evaluation):   Psychosocial: Target Goals: Acknowledge presence or absence of significant depression and/or stress, maximize coping skills, provide positive support system. Participant is able to verbalize types and ability to use techniques and skills needed for reducing stress and depression.  Initial Review & Psychosocial Screening:     Initial Psych Review & Screening - 11/03/16 1627      Initial Review   Current issues with None Identified     Family Dynamics   Good Support System? Yes     Barriers   Psychosocial barriers to participate in program There are no identifiable barriers or psychosocial needs.     Screening Interventions   Interventions Encouraged to exercise      Quality of Life Scores:     Quality of Life - 11/03/16 1435      Quality of Life Scores   Health/Function Pre 26.14 %   Socioeconomic Pre 26.57 %   Psych/Spiritual Pre 29 %   Family Pre 30 %   GLOBAL Pre 27.38 %      PHQ-9: Recent Review Flowsheet Data    There is no flowsheet data to display.     Interpretation of Total Score  Total Score Depression Severity:  1-4 = Minimal depression, 5-9 = Mild depression, 10-14 = Moderate depression, 15-19 = Moderately severe depression, 20-27 = Severe depression  Psychosocial Evaluation and  Intervention:   Psychosocial Re-Evaluation:   Psychosocial Discharge (Final Psychosocial Re-Evaluation):   Vocational Rehabilitation: Provide vocational rehab assistance to qualifying candidates.   Vocational Rehab Evaluation & Intervention:     Vocational Rehab - 11/03/16 1629      Initial Vocational Rehab Evaluation & Intervention   Assessment shows need for Vocational Rehabilitation No      Education: Education Goals: Education classes will be provided on a weekly basis, covering required topics. Participant will state understanding/return demonstration of topics presented.  Learning Barriers/Preferences:     Learning Barriers/Preferences - 11/03/16 1455      Learning Barriers/Preferences   Learning Barriers Sight   Learning Preferences Written Material;Video;Pictoral;Skilled Demonstration      Education Topics: Count Your Pulse:  -Group instruction provided by verbal instruction, demonstration, patient participation and written materials to support subject.  Instructors address importance of being able to find your pulse and how to count your pulse when at home without a heart monitor.  Patients get hands on experience counting their pulse with staff help and individually.   Heart Attack, Angina, and Risk Factor Modification:  -Group instruction provided by verbal instruction, video, and written materials to support subject.  Instructors address signs and symptoms of angina and heart attacks.    Also discuss risk factors for heart disease and how to make changes to improve heart health risk factors.   Functional Fitness:  -Group instruction provided by verbal instruction, demonstration, patient participation, and written materials to support subject.  Instructors address safety measures for doing things around the house.  Discuss how to get up and down off the floor, how to pick things up properly, how to safely get out of a chair without assistance, and balance  training.   Meditation and Mindfulness:  -Group instruction provided by verbal instruction, patient participation, and written materials to support subject.  Instructor addresses importance of mindfulness and meditation practice to help reduce stress and improve awareness.  Instructor also leads participants through a meditation exercise.    Stretching for Flexibility and Mobility:  -Group instruction provided by verbal instruction, patient participation, and written materials to support subject.  Instructors lead participants through series of stretches that are designed to increase flexibility thus improving mobility.  These stretches are additional exercise for major muscle groups that are typically performed during regular warm up and cool down.   Hands Only CPR Anytime:  -Group instruction provided by verbal instruction, video, patient participation and written materials to support subject.  Instructors co-teach with AHA video for hands only CPR.  Participants get hands on experience with mannequins.   Nutrition I class: Heart Healthy Eating:  -Group instruction provided by PowerPoint slides, verbal discussion, and written materials to support subject matter. The instructor gives an explanation and review of the Therapeutic Lifestyle Changes diet recommendations, which includes a discussion on lipid goals, dietary fat, sodium, fiber, plant stanol/sterol esters, sugar, and the components of a well-balanced, healthy diet.   CARDIAC REHAB PHASE II ORIENTATION from 11/03/2016 in [facility] CARDIAC REHAB  Date  11/03/16  Educator  RD  Instruction Review Code  Not applicable [class handouts given]      Nutrition II class: Lifestyle Skills:  -Group instruction provided by PowerPoint slides, verbal discussion, and written materials to support subject matter. The instructor gives an explanation and review of label reading, grocery shopping for heart health, heart healthy recipe  modifications, and ways to make healthier choices when eating out.  CARDIAC REHAB PHASE II ORIENTATION from 11/03/2016 in [facility] CARDIAC REHAB  Date  11/03/16  Educator  RD  Instruction Review Code  Not applicable [class handouts given]      Diabetes Question & Answer:  -Group instruction provided by PowerPoint slides, verbal discussion, and written materials to support subject matter. The instructor gives an explanation and review of diabetes co-morbidities, pre- and post-prandial blood glucose goals, pre-exercise blood glucose goals, signs, symptoms, and treatment of hypoglycemia and hyperglycemia, and foot care basics.   Diabetes Blitz:  -Group instruction provided by PowerPoint slides, verbal discussion, and written materials to support subject matter. The instructor gives an explanation and review of the physiology behind type 1 and type 2 diabetes, diabetes medications and rational behind using different medications, pre- and post-prandial blood glucose recommendations and Hemoglobin A1c goals, diabetes diet, and exercise including blood glucose guidelines for exercising safely.    Portion Distortion:  -Group instruction provided by PowerPoint slides, verbal discussion, written materials, and food models to support subject matter. The instructor gives an explanation of serving size versus portion size, changes in portions sizes over the last 20 years, and what consists of a serving from each food group.   Stress Management:  -Group instruction provided by verbal instruction, video, and written materials to support subject matter.  Instructors review role of stress in heart disease and how to cope with stress positively.     Exercising on Your Own:  -Group instruction provided by verbal instruction, power point, and written materials to support subject.  Instructors discuss benefits of exercise, components of exercise, frequency and intensity of exercise, and end  points for exercise.  Also discuss use of nitroglycerin and activating EMS.  Review options of places to exercise outside of rehab.  Review guidelines for sex with heart disease.   Cardiac Drugs I:  -Group instruction provided by verbal instruction and written materials to support subject.  Instructor reviews cardiac drug classes: antiplatelets, anticoagulants, beta blockers, and statins.  Instructor discusses reasons, side effects, and lifestyle considerations for each drug class.   Cardiac Drugs II:  -Group instruction provided by verbal instruction and written materials to support subject.  Instructor reviews cardiac drug classes: angiotensin converting enzyme inhibitors (ACE-I), angiotensin II receptor blockers (ARBs), nitrates, and calcium channel blockers.  Instructor discusses reasons, side effects, and lifestyle considerations for each drug class.   Anatomy and Physiology of the Circulatory System:  -Group instruction provided by verbal instruction, video, and written materials to support subject.  Reviews functional anatomy of heart, how it relates to various diagnoses, and what role the heart plays in the overall system.   Knowledge Questionnaire Score:     Knowledge Questionnaire Score - 11/03/16 1432      Knowledge Questionnaire Score   Pre Score 21/24      Core Components/Risk Factors/Patient Goals at Admission:     Personal Goals and Risk Factors at Admission - 11/03/16 1546      Core Components/Risk Factors/Patient Goals on Admission    Weight Management Yes;Weight Maintenance;Weight Loss   Intervention Weight Management: Develop a combined nutrition and exercise program designed to reach desired caloric intake, while maintaining appropriate intake of nutrient and fiber, sodium and fats, and appropriate energy expenditure required for the weight goal.;Weight Management: Provide education and appropriate resources to help participant work on and attain dietary  goals.;Weight Management/Obesity: Establish reasonable short term and long term weight goals.   Expected Outcomes Weight Maintenance: Understanding of the daily nutrition  guidelines, which includes 25-35% calories from fat, 7% or less cal from saturated fats, less than  cholesterol, less than 1.5gm of sodium, & 5 or more servings of fruits and vegetables daily;Short Term: Continue to assess and modify interventions until short term weight is achieved;Long Term: Adherence to nutrition and physical activity/exercise program aimed toward attainment of established weight goal;Weight Loss: Understanding of general recommendations for a balanced deficit meal plan, which promotes 1-2 lb weight loss per week and includes a negative energy balance of [phone] kcal/d;Understanding recommendations for meals to include 15-35% energy as protein, 25-35% energy from fat, 35-60% energy from carbohydrates, less than  of dietary cholesterol, 20-35 gm of total fiber daily;Understanding of distribution of calorie intake throughout the day with the consumption of 4-5 meals/snacks   Hypertension Yes   Intervention Provide education on lifestyle modifcations including regular physical activity/exercise, weight management, moderate sodium restriction and increased consumption of fresh fruit, vegetables, and low fat dairy, alcohol moderation, and smoking cessation.;Monitor prescription use compliance.   Expected Outcomes Long Term: Maintenance of blood pressure at goal levels.;Short Term: Continued assessment and intervention until BP is < 140/8mm HG in hypertensive participants. < 130/33mm HG in hypertensive participants with diabetes, heart failure or chronic kidney disease.   Lipids Yes   Intervention Provide education and support for participant on nutrition & aerobic/resistive exercise along with prescribed medications to achieve LDL 70mg , HDL >40mg .   Expected Outcomes Short Term: Participant states understanding of  desired cholesterol values and is compliant with medications prescribed. Participant is following exercise prescription and nutrition guidelines.;Long Term: Cholesterol controlled with medications as prescribed, with individualized exercise RX and with personalized nutrition plan. Value goals: LDL < , HDL > 40 mg.      Core Components/Risk Factors/Patient Goals Review:    Core Components/Risk Factors/Patient Goals at Discharge (Final Review):    ITP Comments:     ITP Comments    Row Name 11/03/16 1432           ITP Comments Medical Director, Dr. Armanda Magic          Comments:  Pt in today for phase II cardiac rehab from 1:30 to 3:30.  As a part of the orientation pt completed 5 minutes of warm up stretches and 6 minute walk test.  Pt tolerated well with no complaints of cp or sob. Monitor showed Sr with no ectopy.  Brief Psychosocial Assessment reveals no immediate barriers to participating in cardiac rehab. Pt is looking forward to participating in cardiac rehab. Alanson Aly, BSN Cardiac and Emergency planning/management officer

## 2016-11-03 NOTE — Progress Notes (Signed)
[name] [name] 71 y.o. female  Nutrition Spoke with pt. Pt c/o constipation "going every 4 days instead of daily." Pt states she drinks plenty of water and has started taking a stool softner but constipation continues. Constipation Nutrition Therapy discussed. Pt constipation reportedly started after pt started taking Brilinta. Pt diet has changed significantly as well. Pt expressed understanding of the information discussed. 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 Pt's husband may be able to watch the special needs adult for pt to attend nutrition classes. Pt given Nutrition class handouts to review and ask questions prn. Continue client-centered nutrition education by RD as part of interdisciplinary care.  Monitor and evaluate progress toward nutrition goal with team.  Mickle Plumb, M.Ed, RD, LDN, CDE 11/03/2016 2:25 PM

## 2016-11-07 ENCOUNTER — Encounter (HOSPITAL_COMMUNITY)
Admission: RE | Admit: 2016-11-07 | Discharge: 2016-11-07 | Disposition: A | Discharge status: Home / Self Care | Payer: PPO | Source: Ambulatory Visit | Attending: Cardiology | Admitting: Cardiology

## 2016-11-07 ENCOUNTER — Encounter (HOSPITAL_COMMUNITY): Payer: Self-pay

## 2016-11-07 DIAGNOSIS — I214 Non-ST elevation (NSTEMI) myocardial infarction: Secondary | ICD-10-CM

## 2016-11-07 DIAGNOSIS — Z955 Presence of coronary angioplasty implant and graft: Secondary | ICD-10-CM

## 2016-11-07 NOTE — Progress Notes (Addendum)
Daily Session Note  Patient Details  Name: [name] [name] MRN: [id number] Date of Birth: 25-Dec-1945 Referring Provider:     CARDIAC REHAB PHASE II ORIENTATION from 11/03/2016 in [place]  Referring Provider  Charolette Forward MD      Encounter Date: 11/07/2016  Check In:     Session Check In - 11/07/16 1354      Check-In   Location [facility]   Staff Present [name] Carol, MS, ACSM CEP, Exercise Physiologist;Amber Fair, MS, ACSM RCEP, Exercise Physiologist;Joann Rion, RN, BSN;Other   Supervising physician immediately available to respond to emergencies [facility] immediately available   Physician(s) Dr. Candiss Norse   Medication changes reported     No   Fall or balance concerns reported    No   Tobacco Cessation No Change   Warm-up and Cool-down Performed as group-led instruction   Resistance Training Performed Yes   VAD Patient? No     Pain Assessment   Currently in Pain? No/denies   Multiple Pain Sites No      Capillary Blood Glucose: No results found for this or any previous visit (from the past 24 hour(s)).    History  Smoking Status  . Never Smoker  Smokeless Tobacco  . Never Used    Goals Met:  Exercise tolerated well  Goals Unmet:  Not Applicable  Comments: Pt started cardiac rehab today.  Pt tolerated light exercise without difficulty. VSS, telemetry-sinus rhythm,  asymptomatic.  Medication list reconciled. Pt denies barriers to medicaiton compliance.  Pt states she is not currently taking atorvastatin.   PC to Dr. Terrence Dupont to clarify atorvastatin order. Dr. Terrence Dupont states pt should be taking atorvastatin.  Pt will be advised at next scheduled appointment.  PSYCHOSOCIAL ASSESSMENT:  PHQ-0. Pt exhibits positive coping skills, hopeful outlook with supportive family. No psychosocial needs identified at this time, no psychosocial interventions necessary.    Pt enjoys sewing quilts.  Pt goals for cardiac rehab  are to incrase strength and stamina.  Pt is presently unable to perform heavy household cleaning.  Pt is also interested in weight loss.   Pt oriented to exercise equipment and routine.    Understanding verbalized.   Dr. Fransico Him is Medical Director for Cardiac Rehab at [facility].

## 2016-11-09 ENCOUNTER — Encounter (HOSPITAL_COMMUNITY)
Admission: RE | Admit: 2016-11-09 | Discharge: 2016-11-09 | Disposition: A | Discharge status: Home / Self Care | Payer: PPO | Source: Ambulatory Visit | Attending: Cardiology | Admitting: Cardiology

## 2016-11-09 ENCOUNTER — Encounter (HOSPITAL_COMMUNITY): Payer: PPO

## 2016-11-09 DIAGNOSIS — I214 Non-ST elevation (NSTEMI) myocardial infarction: Secondary | ICD-10-CM | POA: Diagnosis not present

## 2016-11-09 DIAGNOSIS — Z955 Presence of coronary angioplasty implant and graft: Secondary | ICD-10-CM

## 2016-11-09 NOTE — Progress Notes (Signed)
Cardiac Individual Treatment Plan  Patient Details  Name: [name] [name] MRN: [id number] Date of Birth: [birth year] Referring Provider:     CARDIAC REHAB PHASE II ORIENTATION from 11/03/2016 in [facility] CARDIAC REHAB  Referring Provider  Rinaldo Cloud MD      Initial Encounter Date:    CARDIAC REHAB PHASE II ORIENTATION from 11/03/2016 in [facility] CARDIAC REHAB  Date  11/03/16  Referring Provider  Rinaldo Cloud MD      Visit Diagnosis: 09/15/16 Status post coronary artery stent placement DES ost LAD  Acute non Q wave MI (myocardial infarction), initial episode of care [facility])  Patient's Home Medications on Admission:  Current Outpatient Prescriptions:  .  acetaminophen (TYLENOL) 500 MG tablet, Take 500 mg by mouth every 6 (six) hours as needed for mild pain., Disp: , Rfl:  .  aspirin 81 MG chewable tablet, Chew 81 mg by mouth daily., Disp: , Rfl:  .  atorvastatin (LIPITOR) 20 MG tablet, Take 1 tablet (20 mg total) by mouth daily at 6 PM. (Patient not taking: Reported on 11/07/2016), Disp: 30 tablet, Rfl: 3 .  docusate sodium (COLACE) 100 MG capsule, Take 100 mg by mouth 2 (two) times daily., Disp: , Rfl:  .  fexofenadine (ALLEGRA) 180 MG tablet, Take 180 mg by mouth daily., Disp: , Rfl:  .  lisinopril (PRINIVIL,ZESTRIL) 20 MG tablet, Take 20 mg by mouth daily., Disp: , Rfl:  .  metoprolol tartrate (LOPRESSOR) 25 MG tablet, Take 0.5 tablets (12.5 mg total) by mouth 2 (two) times daily., Disp: 30 tablet, Rfl: 3 .  Multiple Vitamin (MULTIVITAMIN WITH MINERALS) TABS tablet, Take 1 tablet by mouth daily. centrum silver, Disp: , Rfl:  .  nitroGLYCERIN (NITROSTAT) 0.4 MG SL tablet, Place 1 tablet (0.4 mg total) under the tongue every 5 (five) minutes x 3 doses as needed for chest pain., Disp: 25 tablet, Rfl: 12 .  nortriptyline (PAMELOR) 25 MG capsule, Take 25 mg by mouth at bedtime. , Disp: , Rfl:  .  Polyethyl Glycol-Propyl Glycol (SYSTANE OP), Place 1  drop into both eyes daily as needed. For dry eyes, Disp: , Rfl:  .  ticagrelor (BRILINTA) 90 MG TABS tablet, Take 1 tablet (90 mg total) by mouth 2 (two) times daily., Disp: 60 tablet, Rfl: 3  Past Medical History: Past Medical History:  Diagnosis Date  . Arthritis    "knees, hips, hands, back" (09/15/2016)  . Constipation   . Coronary artery disease   . Exercise-induced asthma   . Family history of adverse reaction to anesthesia    "daughter gets PONV too"  . GERD (gastroesophageal reflux disease)   . Heart murmur dx'd 09/14/2016  . High cholesterol   . Hypertension   . Migraine    "recently had series of injections; none since; they had been weekly 1 month ago" (09/15/2016)  . Necrotizing fasciitis (HCC)    R index finger- had a amp  . NSTEMI (non-ST elevated myocardial infarction) (HCC)   . Pneumonia ~ 2003  . PONV (postoperative nausea and vomiting)    Patch helps  . RSD (reflex sympathetic dystrophy)    Right arm after surgery    Tobacco Use: History  Smoking Status  . Never Smoker  Smokeless Tobacco  . Never Used    Labs: Recent Review Flowsheet Data    Labs for ITP Cardiac and Pulmonary Rehab Latest Ref Rng & Units 09/15/2016   Cholestrol 0 - 200 mg/dL 409(W)   LDLCALC  0 - 99 mg/dL 161(W)   HDL >96 mg/dL 48   Trlycerides <045 mg/dL 409      Capillary Blood Glucose: No results found for: GLUCAP   Exercise Target Goals:    Exercise Program Goal: Individual exercise prescription set with THRR, safety & activity barriers. Participant demonstrates ability to understand and report RPE using BORG scale, to self-measure pulse accurately, and to acknowledge the importance of the exercise prescription.  Exercise Prescription Goal: Starting with aerobic activity 30 plus minutes a day, 3 days per week for initial exercise prescription. Provide home exercise prescription and guidelines that participant acknowledges understanding prior to discharge.  Activity Barriers &  Risk Stratification:     Activity Barriers & Cardiac Risk Stratification - 11/03/16 1456      Activity Barriers & Cardiac Risk Stratification   Activity Barriers Back Problems;Muscular Weakness;Deconditioning;Other (comment)   Comments R>L knee pain, back surgery in 2014   Cardiac Risk Stratification High      6 Minute Walk:     6 Minute Walk    Row Name 11/03/16 1434 11/03/16 1535       6 Minute Walk   Phase Initial  -    Distance 1400 feet  -    Walk Time 6 minutes  -    # of Rest Breaks 0  -    MPH 2.65  -    METS 3.1  -    RPE 12  -    VO2 Peak 10.8  -    Symptoms Yes (comment)  -    Comments c/o fatigue at end of walk test  -    Resting HR 73 bpm  -    Resting BP 100/70  -    Max Ex. HR 100 bpm  -    Max Ex. BP 138/80  -    2 Minute Post BP  - 132/78       Oxygen Initial Assessment:   Oxygen Re-Evaluation:   Oxygen Discharge (Final Oxygen Re-Evaluation):   Initial Exercise Prescription:     Initial Exercise Prescription - 11/03/16 1500      Date of Initial Exercise RX and Referring Provider   Date 11/03/16   Referring Provider Rinaldo Cloud MD     Treadmill   MPH 2.2   Grade 1   Minutes 10   METs 2.99     Recumbant Bike   Level 1.5   Minutes 10   METs 2     NuStep   Level 3   SPM 70   Minutes 10   METs 2     Prescription Details   Frequency (times per week) 3   Duration Progress to 30 minutes of continuous aerobic without signs/symptoms of physical distress     Intensity   THRR 40-80% of Max Heartrate 60-119   Ratings of Perceived Exertion 11-13   Perceived Dyspnea 0-4     Progression   Progression Continue to progress workloads to maintain intensity without signs/symptoms of physical distress.     Resistance Training   Training Prescription Yes   Weight 2lbs   Reps 10-15      Perform Capillary Blood Glucose checks as needed.  Exercise Prescription Changes:     Exercise Prescription Changes    Row Name 11/08/16  1600             Response to Exercise   Blood Pressure (Admit) 131/70       Blood Pressure (Exercise) 132/80  Blood Pressure (Exit) 128/70       Heart Rate (Admit) 74 bpm       Heart Rate (Exercise) 101 bpm       Heart Rate (Exit) 71 bpm       Rating of Perceived Exertion (Exercise) 13       Comments Pt completed first exercise session on 11/07/16 without difficulty       Duration Continue with 30 min of aerobic exercise without signs/symptoms of physical distress.       Intensity THRR unchanged         Resistance Training   Training Prescription Yes       Weight 2lbs       Reps 10-15       Time 10 Minutes         Treadmill   MPH 2.2       Grade 1       Minutes 10       METs 2.99         Recumbant Bike   Level 1.5       Minutes 10       METs 2         NuStep   Level 3       SPM 70       Minutes 10       METs 1.9          Exercise Comments:     Exercise Comments    Row Name 11/08/16 1629           Exercise Comments Pt responded well to exercise on 11/07/16. Pt was oriented to exercise equipment and exercise for 30 minute without difficulty          Exercise Goals and Review:     Exercise Goals    Row Name 11/03/16 1456             Exercise Goals   Increase Physical Activity Yes       Intervention Provide advice, education, support and counseling about physical activity/exercise needs.;Develop an individualized exercise prescription for aerobic and resistive training based on initial evaluation findings, risk stratification, comorbidities and participant's personal goals.       Expected Outcomes Achievement of increased cardiorespiratory fitness and enhanced flexibility, muscular endurance and strength shown through measurements of functional capacity and personal statement of participant.       Increase Strength and Stamina Yes       Intervention Provide advice, education, support and counseling about physical activity/exercise needs.;Develop  an individualized exercise prescription for aerobic and resistive training based on initial evaluation findings, risk stratification, comorbidities and participant's personal goals.       Expected Outcomes Achievement of increased cardiorespiratory fitness and enhanced flexibility, muscular endurance and strength shown through measurements of functional capacity and personal statement of participant.          Exercise Goals Re-Evaluation :    Discharge Exercise Prescription (Final Exercise Prescription Changes):     Exercise Prescription Changes - 11/08/16 1600      Response to Exercise   Blood Pressure (Admit) 131/70   Blood Pressure (Exercise) 132/80   Blood Pressure (Exit) 128/70   Heart Rate (Admit) 74 bpm   Heart Rate (Exercise) 101 bpm   Heart Rate (Exit) 71 bpm   Rating of Perceived Exertion (Exercise) 13   Comments Pt completed first exercise session on 11/07/16 without difficulty   Duration Continue with 30 min of aerobic exercise without  signs/symptoms of physical distress.   Intensity THRR unchanged     Resistance Training   Training Prescription Yes   Weight 2lbs   Reps 10-15   Time 10 Minutes     Treadmill   MPH 2.2   Grade 1   Minutes 10   METs 2.99     Recumbant Bike   Level 1.5   Minutes 10   METs 2     NuStep   Level 3   SPM 70   Minutes 10   METs 1.9      Nutrition:  Target Goals: Understanding of nutrition guidelines, daily intake of sodium 1500mg , cholesterol 200mg , calories 30% from fat and 7% or less from saturated fats, daily to have 5 or more servings of fruits and vegetables.  Biometrics:     Pre Biometrics - 11/03/16 1553      Pre Biometrics   Weight 161 lb 6 oz (73.2 kg)       Nutrition Therapy Plan and Nutrition Goals:     Nutrition Therapy & Goals - 11/03/16 1419      Nutrition Therapy   Diet Therapeutic Lifestyle Changes     Intervention Plan   Intervention Prescribe, educate and counsel regarding individualized  specific dietary modifications aiming towards targeted core components such as weight, hypertension, lipid management, diabetes, heart failure and other comorbidities.;Nutrition handout(s) given to patient.  Constipation Nutrition Therapy   Expected Outcomes Short Term Goal: Understand basic principles of dietary content, such as calories, fat, sodium, cholesterol and nutrients.;Long Term Goal: Adherence to prescribed nutrition plan.      Nutrition Discharge: Nutrition Scores:   Nutrition Goals Re-Evaluation:   Nutrition Goals Re-Evaluation:   Nutrition Goals Discharge (Final Nutrition Goals Re-Evaluation):   Psychosocial: Target Goals: Acknowledge presence or absence of significant depression and/or stress, maximize coping skills, provide positive support system. Participant is able to verbalize types and ability to use techniques and skills needed for reducing stress and depression.  Initial Review & Psychosocial Screening:     Initial Psych Review & Screening - 11/03/16 1627      Initial Review   Current issues with None Identified     Family Dynamics   Good Support System? Yes     Barriers   Psychosocial barriers to participate in program There are no identifiable barriers or psychosocial needs.     Screening Interventions   Interventions Encouraged to exercise      Quality of Life Scores:     Quality of Life - 11/03/16 1435      Quality of Life Scores   Health/Function Pre 26.14 %   Socioeconomic Pre 26.57 %   Psych/Spiritual Pre 29 %   Family Pre 30 %   GLOBAL Pre 27.38 %      PHQ-9: Recent Review Flowsheet Data    Depression screen [facility] 2/9 11/07/2016   Decreased Interest 0   Down, Depressed, Hopeless 0   PHQ - 2 Score 0     Interpretation of Total Score  Total Score Depression Severity:  1-4 = Minimal depression, 5-9 = Mild depression, 10-14 = Moderate depression, 15-19 = Moderately severe depression, 20-27 = Severe depression   Psychosocial  Evaluation and Intervention:     Psychosocial Evaluation - 11/07/16 1621      Psychosocial Evaluation & Interventions   Interventions Encouraged to exercise with the program and follow exercise prescription   Comments no psychosocial needs identified, no interventions necessary    Expected Outcomes pt will exhibit  positive outlook with good coping skills.    Continue Psychosocial Services  No Follow up required      Psychosocial Re-Evaluation:   Psychosocial Discharge (Final Psychosocial Re-Evaluation):   Vocational Rehabilitation: Provide vocational rehab assistance to qualifying candidates.   Vocational Rehab Evaluation & Intervention:     Vocational Rehab - 11/07/16 1620      Initial Vocational Rehab Evaluation & Intervention   Assessment shows need for Vocational Rehabilitation No      Education: Education Goals: Education classes will be provided on a weekly basis, covering required topics. Participant will state understanding/return demonstration of topics presented.  Learning Barriers/Preferences:     Learning Barriers/Preferences - 11/03/16 1455      Learning Barriers/Preferences   Learning Barriers Sight   Learning Preferences Written Material;Video;Pictoral;Skilled Demonstration      Education Topics: Count Your Pulse:  -Group instruction provided by verbal instruction, demonstration, patient participation and written materials to support subject.  Instructors address importance of being able to find your pulse and how to count your pulse when at home without a heart monitor.  Patients get hands on experience counting their pulse with staff help and individually.   Heart Attack, Angina, and Risk Factor Modification:  -Group instruction provided by verbal instruction, video, and written materials to support subject.  Instructors address signs and symptoms of angina and heart attacks.    Also discuss risk factors for heart disease and how to make changes to  improve heart health risk factors.   Functional Fitness:  -Group instruction provided by verbal instruction, demonstration, patient participation, and written materials to support subject.  Instructors address safety measures for doing things around the house.  Discuss how to get up and down off the floor, how to pick things up properly, how to safely get out of a chair without assistance, and balance training.   Meditation and Mindfulness:  -Group instruction provided by verbal instruction, patient participation, and written materials to support subject.  Instructor addresses importance of mindfulness and meditation practice to help reduce stress and improve awareness.  Instructor also leads participants through a meditation exercise.    Stretching for Flexibility and Mobility:  -Group instruction provided by verbal instruction, patient participation, and written materials to support subject.  Instructors lead participants through series of stretches that are designed to increase flexibility thus improving mobility.  These stretches are additional exercise for major muscle groups that are typically performed during regular warm up and cool down.   Hands Only CPR Anytime:  -Group instruction provided by verbal instruction, video, patient participation and written materials to support subject.  Instructors co-teach with AHA video for hands only CPR.  Participants get hands on experience with mannequins.   Nutrition I class: Heart Healthy Eating:  -Group instruction provided by PowerPoint slides, verbal discussion, and written materials to support subject matter. The instructor gives an explanation and review of the Therapeutic Lifestyle Changes diet recommendations, which includes a discussion on lipid goals, dietary fat, sodium, fiber, plant stanol/sterol esters, sugar, and the components of a well-balanced, healthy diet.   CARDIAC REHAB PHASE II ORIENTATION from 11/03/2016 in [facility] CARDIAC REHAB  Date  11/03/16  Educator  RD  Instruction Review Code  Not applicable [class handouts given]      Nutrition II class: Lifestyle Skills:  -Group instruction provided by PowerPoint slides, verbal discussion, and written materials to support subject matter. The instructor gives an explanation and review of label reading, grocery shopping for heart  health, heart healthy recipe modifications, and ways to make healthier choices when eating out.   CARDIAC REHAB PHASE II ORIENTATION from 11/03/2016 in [facility] CARDIAC REHAB  Date  11/03/16  Educator  RD  Instruction Review Code  Not applicable [class handouts given]      Diabetes Question & Answer:  -Group instruction provided by PowerPoint slides, verbal discussion, and written materials to support subject matter. The instructor gives an explanation and review of diabetes co-morbidities, pre- and post-prandial blood glucose goals, pre-exercise blood glucose goals, signs, symptoms, and treatment of hypoglycemia and hyperglycemia, and foot care basics.   Diabetes Blitz:  -Group instruction provided by PowerPoint slides, verbal discussion, and written materials to support subject matter. The instructor gives an explanation and review of the physiology behind type 1 and type 2 diabetes, diabetes medications and rational behind using different medications, pre- and post-prandial blood glucose recommendations and Hemoglobin A1c goals, diabetes diet, and exercise including blood glucose guidelines for exercising safely.    Portion Distortion:  -Group instruction provided by PowerPoint slides, verbal discussion, written materials, and food models to support subject matter. The instructor gives an explanation of serving size versus portion size, changes in portions sizes over the last 20 years, and what consists of a serving from each food group.   Stress Management:  -Group instruction provided by verbal  instruction, video, and written materials to support subject matter.  Instructors review role of stress in heart disease and how to cope with stress positively.     Exercising on Your Own:  -Group instruction provided by verbal instruction, power point, and written materials to support subject.  Instructors discuss benefits of exercise, components of exercise, frequency and intensity of exercise, and end points for exercise.  Also discuss use of nitroglycerin and activating EMS.  Review options of places to exercise outside of rehab.  Review guidelines for sex with heart disease.   Cardiac Drugs I:  -Group instruction provided by verbal instruction and written materials to support subject.  Instructor reviews cardiac drug classes: antiplatelets, anticoagulants, beta blockers, and statins.  Instructor discusses reasons, side effects, and lifestyle considerations for each drug class.   Cardiac Drugs II:  -Group instruction provided by verbal instruction and written materials to support subject.  Instructor reviews cardiac drug classes: angiotensin converting enzyme inhibitors (ACE-I), angiotensin II receptor blockers (ARBs), nitrates, and calcium channel blockers.  Instructor discusses reasons, side effects, and lifestyle considerations for each drug class.   Anatomy and Physiology of the Circulatory System:  -Group instruction provided by verbal instruction, video, and written materials to support subject.  Reviews functional anatomy of heart, how it relates to various diagnoses, and what role the heart plays in the overall system.   Knowledge Questionnaire Score:     Knowledge Questionnaire Score - 11/03/16 1432      Knowledge Questionnaire Score   Pre Score 21/24      Core Components/Risk Factors/Patient Goals at Admission:     Personal Goals and Risk Factors at Admission - 11/03/16 1546      Core Components/Risk Factors/Patient Goals on Admission    Weight Management Yes;Weight  Maintenance;Weight Loss   Intervention Weight Management: Develop a combined nutrition and exercise program designed to reach desired caloric intake, while maintaining appropriate intake of nutrient and fiber, sodium and fats, and appropriate energy expenditure required for the weight goal.;Weight Management: Provide education and appropriate resources to help participant work on and attain dietary goals.;Weight Management/Obesity: Establish reasonable short term  and long term weight goals.   Expected Outcomes Weight Maintenance: Understanding of the daily nutrition guidelines, which includes 25-35% calories from fat, 7% or less cal from saturated fats, less than  cholesterol, less than 1.5gm of sodium, & 5 or more servings of fruits and vegetables daily;Short Term: Continue to assess and modify interventions until short term weight is achieved;Long Term: Adherence to nutrition and physical activity/exercise program aimed toward attainment of established weight goal;Weight Loss: Understanding of general recommendations for a balanced deficit meal plan, which promotes 1-2 lb weight loss per week and includes a negative energy balance of [phone] kcal/d;Understanding recommendations for meals to include 15-35% energy as protein, 25-35% energy from fat, 35-60% energy from carbohydrates, less than  of dietary cholesterol, 20-35 gm of total fiber daily;Understanding of distribution of calorie intake throughout the day with the consumption of 4-5 meals/snacks   Hypertension Yes   Intervention Provide education on lifestyle modifcations including regular physical activity/exercise, weight management, moderate sodium restriction and increased consumption of fresh fruit, vegetables, and low fat dairy, alcohol moderation, and smoking cessation.;Monitor prescription use compliance.   Expected Outcomes Long Term: Maintenance of blood pressure at goal levels.;Short Term: Continued assessment and intervention until  BP is < 140/26mm HG in hypertensive participants. < 130/56mm HG in hypertensive participants with diabetes, heart failure or chronic kidney disease.   Lipids Yes   Intervention Provide education and support for participant on nutrition & aerobic/resistive exercise along with prescribed medications to achieve LDL 70mg , HDL >40mg .   Expected Outcomes Short Term: Participant states understanding of desired cholesterol values and is compliant with medications prescribed. Participant is following exercise prescription and nutrition guidelines.;Long Term: Cholesterol controlled with medications as prescribed, with individualized exercise RX and with personalized nutrition plan. Value goals: LDL < , HDL > 40 mg.      Core Components/Risk Factors/Patient Goals Review:    Core Components/Risk Factors/Patient Goals at Discharge (Final Review):    ITP Comments:     ITP Comments    Row Name 11/03/16 1432           ITP Comments Medical Director, Dr. Armanda Magic          Comments: Pt is making expected progress toward personal goals after completing 2 sessions. Recommend continued exercise and life style modification education including  stress management and relaxation techniques to decrease cardiac risk profile.

## 2016-11-11 ENCOUNTER — Telehealth: Payer: Self-pay | Admitting: *Deleted

## 2016-11-11 ENCOUNTER — Encounter (HOSPITAL_COMMUNITY): Payer: PPO

## 2016-11-11 ENCOUNTER — Encounter (HOSPITAL_COMMUNITY)
Admission: RE | Admit: 2016-11-11 | Discharge: 2016-11-11 | Disposition: A | Discharge status: Home / Self Care | Payer: PPO | Source: Ambulatory Visit | Attending: Cardiology | Admitting: Cardiology

## 2016-11-11 DIAGNOSIS — I214 Non-ST elevation (NSTEMI) myocardial infarction: Secondary | ICD-10-CM | POA: Diagnosis not present

## 2016-11-11 DIAGNOSIS — Z955 Presence of coronary angioplasty implant and graft: Secondary | ICD-10-CM

## 2016-11-11 NOTE — Telephone Encounter (Signed)
Called to schedule appointment with Dr. Antoine Poche on Monday 11/14/16 @ 7:40 am.  Mr. Liverman states he will have his wife call to schedule when she come in today in about 30 minutes.

## 2016-11-11 NOTE — Progress Notes (Signed)
Daily Session Note  Patient Details  Name: [name] [name] MRN: [id number] Date of Birth: Nov 23, 1945 Referring Provider:     CARDIAC REHAB PHASE II ORIENTATION from 11/03/2016 in [place]  Referring Provider  Charolette Forward MD      Encounter Date: 11/11/2016  Check In:     Session Check In - 11/11/16 1514      Check-In   Location [facility]   Staff Present Su Hilt, MS, ACSM RCEP, Exercise Physiologist;Maria Whitaker, RN, Luisa Hart, RN, BSN;Amber Fair, MS, ACSM RCEP, Exercise Physiologist   Supervising physician immediately available to respond to emergencies [facility] immediately available   Physician(s) Dr. Posey Pronto   Medication changes reported     No   Fall or balance concerns reported    No   Tobacco Cessation No Change   Warm-up and Cool-down Performed as group-led instruction   Resistance Training Performed Yes   VAD Patient? No     Pain Assessment   Currently in Pain? No/denies   Multiple Pain Sites No      Capillary Blood Glucose: No results found for this or any previous visit (from the past 24 hour(s)).    History  Smoking Status  . Never Smoker  Smokeless Tobacco  . Never Used    Goals Met:  Exercise tolerated well  Goals Unmet:  Not Applicable  Comments: pt arrived at cardiac rehab reporting change in lipitor dosage. Pt states per Dr. Coletta Memos she is currently taking lipitor '10mg'$  daily.  Med list updated.  Pt is also in process of re-establishing care with [facility].  Pt is awaiting scheduling phone call.    Dr. Fransico Him is Medical Director for Cardiac Rehab at [facility].

## 2016-11-14 ENCOUNTER — Encounter (HOSPITAL_COMMUNITY): Payer: PPO

## 2016-11-14 ENCOUNTER — Encounter (HOSPITAL_COMMUNITY)
Admission: RE | Admit: 2016-11-14 | Discharge: 2016-11-14 | Disposition: A | Discharge status: Home / Self Care | Payer: PPO | Source: Ambulatory Visit | Attending: Cardiology | Admitting: Cardiology

## 2016-11-14 DIAGNOSIS — I214 Non-ST elevation (NSTEMI) myocardial infarction: Secondary | ICD-10-CM

## 2016-11-14 DIAGNOSIS — Z955 Presence of coronary angioplasty implant and graft: Secondary | ICD-10-CM

## 2016-11-15 ENCOUNTER — Encounter: Payer: Self-pay | Admitting: *Deleted

## 2016-11-16 ENCOUNTER — Encounter (HOSPITAL_COMMUNITY): Payer: PPO

## 2016-11-16 ENCOUNTER — Encounter (HOSPITAL_COMMUNITY)
Admission: RE | Admit: 2016-11-16 | Discharge: 2016-11-16 | Disposition: A | Discharge status: Home / Self Care | Payer: PPO | Source: Ambulatory Visit | Attending: Cardiology | Admitting: Cardiology

## 2016-11-16 DIAGNOSIS — I214 Non-ST elevation (NSTEMI) myocardial infarction: Secondary | ICD-10-CM | POA: Insufficient documentation

## 2016-11-16 DIAGNOSIS — I10 Essential (primary) hypertension: Secondary | ICD-10-CM | POA: Insufficient documentation

## 2016-11-16 DIAGNOSIS — I2510 Atherosclerotic heart disease of native coronary artery without angina pectoris: Secondary | ICD-10-CM | POA: Diagnosis not present

## 2016-11-16 DIAGNOSIS — M1990 Unspecified osteoarthritis, unspecified site: Secondary | ICD-10-CM | POA: Insufficient documentation

## 2016-11-16 DIAGNOSIS — Z955 Presence of coronary angioplasty implant and graft: Secondary | ICD-10-CM | POA: Insufficient documentation

## 2016-11-16 DIAGNOSIS — K219 Gastro-esophageal reflux disease without esophagitis: Secondary | ICD-10-CM | POA: Insufficient documentation

## 2016-11-16 DIAGNOSIS — E7800 Pure hypercholesterolemia, unspecified: Secondary | ICD-10-CM | POA: Diagnosis not present

## 2016-11-16 DIAGNOSIS — Z7982 Long term (current) use of aspirin: Secondary | ICD-10-CM | POA: Diagnosis not present

## 2016-11-16 DIAGNOSIS — Z79899 Other long term (current) drug therapy: Secondary | ICD-10-CM | POA: Insufficient documentation

## 2016-11-17 ENCOUNTER — Encounter: Payer: Self-pay | Admitting: Anesthesiology

## 2016-11-17 ENCOUNTER — Telehealth: Payer: Self-pay | Admitting: Cardiovascular Disease

## 2016-11-17 NOTE — Telephone Encounter (Signed)
Received records from [facility] (Dr Sharyn LullHarwani) for appointment on 11/18/16 with Dr Allyson SabalBerry.  Records put with Dr Hazle CocaBerry's schedule for 11/18/16. lp

## 2016-11-18 ENCOUNTER — Encounter (HOSPITAL_COMMUNITY): Payer: PPO

## 2016-11-18 ENCOUNTER — Encounter (HOSPITAL_COMMUNITY)
Admission: RE | Admit: 2016-11-18 | Discharge: 2016-11-18 | Disposition: A | Discharge status: Home / Self Care | Payer: PPO | Source: Ambulatory Visit | Attending: Cardiology | Admitting: Cardiology

## 2016-11-18 ENCOUNTER — Ambulatory Visit (INDEPENDENT_AMBULATORY_CARE_PROVIDER_SITE_OTHER): Payer: PPO | Admitting: Cardiovascular Disease

## 2016-11-18 ENCOUNTER — Encounter: Payer: Self-pay | Admitting: Cardiovascular Disease

## 2016-11-18 DIAGNOSIS — I214 Non-ST elevation (NSTEMI) myocardial infarction: Secondary | ICD-10-CM

## 2016-11-18 DIAGNOSIS — E7800 Pure hypercholesterolemia, unspecified: Secondary | ICD-10-CM | POA: Diagnosis not present

## 2016-11-18 DIAGNOSIS — Z955 Presence of coronary angioplasty implant and graft: Secondary | ICD-10-CM

## 2016-11-18 MED ORDER — TICAGRELOR 90 MG PO TABS: 90.0000 mg | ORAL_TABLET | Freq: Two times a day (BID) | ORAL | 4 refills | Status: DC

## 2016-11-18 NOTE — Assessment & Plan Note (Signed)
History of hyperlipidemia on statin therapy followed by her PCP. 

## 2016-11-18 NOTE — Patient Instructions (Signed)

## 2016-11-18 NOTE — Assessment & Plan Note (Signed)
History of recent non-STEMI with cardiac catheterization performed by Dr. Sharyn LullHarwani any reflex 1/18 revealing a 70% long LAD lesion without other significant coronary disease. She had normal LV function. Her symptoms began several days prior to that. Her troponins were minimally elevated. A flow wire was used which showed an FFR 0.79 and therefore the lesion was stented with a Xience alpine 3 mm x 15 mm long drug-eluting stent. She is on aspirin Brilenta and has had no recurrent symptoms.

## 2016-11-18 NOTE — Progress Notes (Signed)
11/18/2016 Sheliah HatchJanice P [name]   [birth year]  [id number]  Primary Physician Aura DialsBOUSKA,DAVID E, MD Primary Cardiologist: Runell GessJonathan J Daesha Insco MD Roseanne RenoFACP, FACC, FAHA, FSCAI  HPI:  [name] is a delightful 71 year old mildly overweight married Caucasian female mother of 3, hemoglobin of 11 grandchildren who is retired from working in [employer]. She was referred by Dr. Arma HeadingBuska , her primary care physician, for ongoing cardiovascular care because her prior cardiologist was out of network. Her cardiac risk factor profile is notable for treated hypertension and hyperlipidemia. She does not smoke. She had a non-STEMI at the end of February and underwent cardiac catheterization several days later on 09/15/16 by Dr. Sharyn LullHarwani revealing a 70% proximal LAD lesion. Her troponins are minimally elevated. EKG showed no acute changes. Her ejection fraction was normal. The proximal LAD lesion underwent FFR interrogation which was 0.79 and therefore it was stented with a Xience alpine 3 mm x 15 mm long drug-eluting stent. She was discharged home on low-dose aspirin Brilenta. She is participating cardiac rehabilitation. She has had no recurrent symptoms.   Current Outpatient Prescriptions  Medication Sig Dispense Refill  . acetaminophen (TYLENOL) 500 MG tablet Take 500 mg by mouth every 6 (six) hours as needed for mild pain.    Marland Kitchen. albuterol (PROVENTIL HFA;VENTOLIN HFA) 108 (90 Base) MCG/ACT inhaler Inhale 2 puffs into the lungs 4 (four) times daily as needed.    Marland Kitchen. aspirin 81 MG chewable tablet Chew 81 mg by mouth daily.    Marland Kitchen. atorvastatin (LIPITOR) 20 MG tablet Take 10 mg by mouth daily.     . baclofen (LIORESAL) 10 MG tablet Take 10 mg by mouth daily.    Marland Kitchen. docusate sodium (COLACE) 100 MG capsule Take 100 mg by mouth 2 (two) times daily.    . fexofenadine (ALLEGRA) 180 MG tablet Take 180 mg by mouth daily.    Marland Kitchen. lisinopril (PRINIVIL,ZESTRIL) 20 MG tablet Take 20 mg by mouth daily.    . metoprolol tartrate (LOPRESSOR) 25 MG tablet  Take 0.5 tablets (12.5 mg total) by mouth 2 (two) times daily. 30 tablet 3  . Multiple Vitamin (MULTIVITAMIN WITH MINERALS) TABS tablet Take 1 tablet by mouth daily. centrum silver    . nitroGLYCERIN (NITROSTAT) 0.4 MG SL tablet Place 1 tablet under the tongue every 5 (five) minutes x 3 doses as needed.    . nortriptyline (PAMELOR) 25 MG capsule Take 50 mg by mouth daily.    Bertram Gala. Polyethyl Glycol-Propyl Glycol (SYSTANE OP) Place 1 drop into both eyes daily as needed. For dry eyes    . ticagrelor (BRILINTA) 90 MG TABS tablet Take 1 tablet (90 mg total) by mouth 2 (two) times daily. 180 tablet 4   No current facility-administered medications for this visit.     Allergies  Allergen Reactions  . Codeine Anaphylaxis  . Garlic Anaphylaxis  . Atorvastatin Other (See Comments)    Muscle aches  . Pravastatin Sodium Other (See Comments)    Muscle pain    Social History   Social History  . Marital status: Married    Spouse name: N/A  . Number of children: N/A  . Years of education: N/A   Occupational History  . Not on file.   Social History Main Topics  . Smoking status: Never Smoker  . Smokeless tobacco: Never Used  . Alcohol use No  . Drug use: No  . Sexual activity: Not on file   Other Topics Concern  . Not on file   Social  History Narrative  . No narrative on file     Review of Systems: General: negative for chills, fever, night sweats or weight changes.  Cardiovascular: negative for chest pain, dyspnea on exertion, edema, orthopnea, palpitations, paroxysmal nocturnal dyspnea or shortness of breath Dermatological: negative for rash Respiratory: negative for cough or wheezing Urologic: negative for hematuria Abdominal: negative for nausea, vomiting, diarrhea, bright red blood per rectum, melena, or hematemesis Neurologic: negative for visual changes, syncope, or dizziness All other systems reviewed and are otherwise negative except as noted above.    Blood pressure  133/82, pulse 75, height 5' 5.25" (1.657 m), weight 156 lb (70.8 kg), SpO2 99 %.  General appearance: alert and no distress Neck: no adenopathy, no carotid bruit, no JVD, supple, symmetrical, trachea midline and thyroid not enlarged, symmetric, no tenderness/mass/nodules Lungs: clear to auscultation bilaterally Heart: regular rate and rhythm, S1, S2 normal, no murmur, click, rub or gallop Extremities: extremities normal, atraumatic, no cyanosis or edema  EKG not performed today  ASSESSMENT AND PLAN:   HYPERTENSION History of hypertension with blood pressure measured today at 133/82. She is on lisinopril and metoprolol. Continue current meds at current dosing  Acute non Q wave MI (myocardial infarction), initial episode of care [facility]) History of recent non-STEMI with cardiac catheterization performed by Dr. Sharyn Lull any reflex 1/18 revealing a 70% long LAD lesion without other significant coronary disease. She had normal LV function. Her symptoms began several days prior to that. Her troponins were minimally elevated. A flow wire was used which showed an FFR 0.79 and therefore the lesion was stented with a Xience alpine 3 mm x 15 mm long drug-eluting stent. She is on aspirin Brilenta and has had no recurrent symptoms.  Hyperlipidemia History of hyperlipidemia on statin therapy followed by her PCP      Runell Gess MD [facility] 11/18/2016 10:36 AM

## 2016-11-18 NOTE — Assessment & Plan Note (Signed)
History of hypertension with blood pressure measured today at 133/82. She is on lisinopril and metoprolol. Continue current meds at current dosing

## 2016-11-21 ENCOUNTER — Encounter (HOSPITAL_COMMUNITY): Payer: PPO

## 2016-11-21 ENCOUNTER — Encounter (HOSPITAL_COMMUNITY)
Admission: RE | Admit: 2016-11-21 | Discharge: 2016-11-21 | Disposition: A | Discharge status: Home / Self Care | Payer: PPO | Source: Ambulatory Visit | Attending: Cardiology | Admitting: Cardiology

## 2016-11-21 DIAGNOSIS — I214 Non-ST elevation (NSTEMI) myocardial infarction: Secondary | ICD-10-CM

## 2016-11-21 DIAGNOSIS — Z955 Presence of coronary angioplasty implant and graft: Secondary | ICD-10-CM

## 2016-11-23 ENCOUNTER — Encounter (HOSPITAL_COMMUNITY): Payer: PPO

## 2016-11-23 ENCOUNTER — Encounter (HOSPITAL_COMMUNITY)
Admission: RE | Admit: 2016-11-23 | Discharge: 2016-11-23 | Disposition: A | Discharge status: Home / Self Care | Payer: PPO | Source: Ambulatory Visit | Attending: Cardiology | Admitting: Cardiology

## 2016-11-23 DIAGNOSIS — I214 Non-ST elevation (NSTEMI) myocardial infarction: Secondary | ICD-10-CM

## 2016-11-23 DIAGNOSIS — Z955 Presence of coronary angioplasty implant and graft: Secondary | ICD-10-CM

## 2016-11-24 NOTE — Progress Notes (Signed)
On 11/23/16 Reviewed home exercise program with pt.  Discussed mode/frequency of exercise, THRR, RPE scale and weather conditions for exercising outdoors.  Also discussed signs and symptoms and when to call Dr./911.  Pt verbalized understanding.  Rosine DoorAshley Falcon Mccaskey, MS ACSM RCEP 11/24/2016 1645

## 2016-11-25 ENCOUNTER — Encounter (HOSPITAL_COMMUNITY): Payer: PPO

## 2016-11-25 ENCOUNTER — Encounter (HOSPITAL_COMMUNITY)
Admission: RE | Admit: 2016-11-25 | Discharge: 2016-11-25 | Disposition: A | Discharge status: Home / Self Care | Payer: PPO | Source: Ambulatory Visit | Attending: Cardiology | Admitting: Cardiology

## 2016-11-25 DIAGNOSIS — Z955 Presence of coronary angioplasty implant and graft: Secondary | ICD-10-CM

## 2016-11-25 DIAGNOSIS — I214 Non-ST elevation (NSTEMI) myocardial infarction: Secondary | ICD-10-CM | POA: Diagnosis not present

## 2016-11-28 ENCOUNTER — Encounter (HOSPITAL_COMMUNITY): Payer: PPO

## 2016-11-28 ENCOUNTER — Encounter (HOSPITAL_COMMUNITY)
Admission: RE | Admit: 2016-11-28 | Discharge: 2016-11-28 | Disposition: A | Discharge status: Home / Self Care | Payer: PPO | Source: Ambulatory Visit | Attending: Cardiology | Admitting: Cardiology

## 2016-11-28 DIAGNOSIS — Z955 Presence of coronary angioplasty implant and graft: Secondary | ICD-10-CM

## 2016-11-28 DIAGNOSIS — I214 Non-ST elevation (NSTEMI) myocardial infarction: Secondary | ICD-10-CM | POA: Diagnosis not present

## 2016-11-30 ENCOUNTER — Encounter (HOSPITAL_COMMUNITY)
Admission: RE | Admit: 2016-11-30 | Discharge: 2016-11-30 | Disposition: A | Discharge status: Home / Self Care | Payer: PPO | Source: Ambulatory Visit | Attending: Cardiology | Admitting: Cardiology

## 2016-11-30 ENCOUNTER — Encounter (HOSPITAL_COMMUNITY): Payer: PPO

## 2016-11-30 DIAGNOSIS — Z955 Presence of coronary angioplasty implant and graft: Secondary | ICD-10-CM

## 2016-11-30 DIAGNOSIS — I214 Non-ST elevation (NSTEMI) myocardial infarction: Secondary | ICD-10-CM | POA: Diagnosis not present

## 2016-12-02 ENCOUNTER — Encounter (HOSPITAL_COMMUNITY)
Admission: RE | Admit: 2016-12-02 | Discharge: 2016-12-02 | Disposition: A | Discharge status: Home / Self Care | Payer: PPO | Source: Ambulatory Visit | Attending: Cardiology | Admitting: Cardiology

## 2016-12-02 ENCOUNTER — Encounter (HOSPITAL_COMMUNITY): Payer: PPO

## 2016-12-02 DIAGNOSIS — Z955 Presence of coronary angioplasty implant and graft: Secondary | ICD-10-CM

## 2016-12-02 DIAGNOSIS — I214 Non-ST elevation (NSTEMI) myocardial infarction: Secondary | ICD-10-CM | POA: Diagnosis not present

## 2016-12-05 ENCOUNTER — Encounter (HOSPITAL_COMMUNITY)
Admission: RE | Admit: 2016-12-05 | Discharge: 2016-12-05 | Disposition: A | Discharge status: Home / Self Care | Payer: PPO | Source: Ambulatory Visit | Attending: Cardiology | Admitting: Cardiology

## 2016-12-05 ENCOUNTER — Encounter (HOSPITAL_COMMUNITY): Payer: PPO

## 2016-12-05 DIAGNOSIS — I214 Non-ST elevation (NSTEMI) myocardial infarction: Secondary | ICD-10-CM | POA: Diagnosis not present

## 2016-12-05 DIAGNOSIS — Z955 Presence of coronary angioplasty implant and graft: Secondary | ICD-10-CM

## 2016-12-06 NOTE — Progress Notes (Signed)
Cardiac Individual Treatment Plan  Patient Details  Name: [name] [name] MRN: [id number] Date of Birth: 1945-12-15 Referring Provider:     CARDIAC REHAB PHASE II ORIENTATION from 11/03/2016 in [facility] CARDIAC REHAB  Referring Provider  Rinaldo Cloud MD      Initial Encounter Date:    CARDIAC REHAB PHASE II ORIENTATION from 11/03/2016 in [facility] CARDIAC REHAB  Date  11/03/16  Referring Provider  Rinaldo Cloud MD      Visit Diagnosis: Acute non Q wave MI (myocardial infarction), initial episode of care [facility])  09/15/16 Status post coronary artery stent placement DES ost LAD  Patient's Home Medications on Admission:  Current Outpatient Prescriptions:  .  acetaminophen (TYLENOL) 500 MG tablet, Take 500 mg by mouth every 6 (six) hours as needed for mild pain., Disp: , Rfl:  .  albuterol (PROVENTIL HFA;VENTOLIN HFA) 108 (90 Base) MCG/ACT inhaler, Inhale 2 puffs into the lungs 4 (four) times daily as needed., Disp: , Rfl:  .  aspirin 81 MG chewable tablet, Chew 81 mg by mouth daily., Disp: , Rfl:  .  atorvastatin (LIPITOR) 20 MG tablet, Take 10 mg by mouth daily. , Disp: , Rfl:  .  baclofen (LIORESAL) 10 MG tablet, Take 10 mg by mouth daily., Disp: , Rfl:  .  docusate sodium (COLACE) 100 MG capsule, Take 100 mg by mouth 2 (two) times daily., Disp: , Rfl:  .  fexofenadine (ALLEGRA) 180 MG tablet, Take 180 mg by mouth daily., Disp: , Rfl:  .  lisinopril (PRINIVIL,ZESTRIL) 20 MG tablet, Take 20 mg by mouth daily., Disp: , Rfl:  .  metoprolol tartrate (LOPRESSOR) 25 MG tablet, Take 0.5 tablets (12.5 mg total) by mouth 2 (two) times daily., Disp: 30 tablet, Rfl: 3 .  Multiple Vitamin (MULTIVITAMIN WITH MINERALS) TABS tablet, Take 1 tablet by mouth daily. centrum silver, Disp: , Rfl:  .  nitroGLYCERIN (NITROSTAT) 0.4 MG SL tablet, Place 1 tablet under the tongue every 5 (five) minutes x 3 doses as needed., Disp: , Rfl:  .  nortriptyline (PAMELOR) 25 MG  capsule, Take 50 mg by mouth daily., Disp: , Rfl:  .  Polyethyl Glycol-Propyl Glycol (SYSTANE OP), Place 1 drop into both eyes daily as needed. For dry eyes, Disp: , Rfl:  .  ticagrelor (BRILINTA) 90 MG TABS tablet, Take 1 tablet (90 mg total) by mouth 2 (two) times daily., Disp: 180 tablet, Rfl: 4  Past Medical History: Past Medical History:  Diagnosis Date  . Arthritis    "knees, hips, hands, back" (09/15/2016)  . Constipation   . Coronary artery disease   . Exercise-induced asthma   . Family history of adverse reaction to anesthesia    "daughter gets PONV too"  . GERD (gastroesophageal reflux disease)   . Heart murmur dx'd 09/14/2016  . High cholesterol   . Hypertension   . Migraine    "recently had series of injections; none since; they had been weekly 1 month ago" (09/15/2016)  . Necrotizing fasciitis (HCC)    R index finger- had a amp  . NSTEMI (non-ST elevated myocardial infarction) (HCC)   . Pneumonia ~ 2003  . PONV (postoperative nausea and vomiting)    Patch helps  . RSD (reflex sympathetic dystrophy)    Right arm after surgery    Tobacco Use: History  Smoking Status  . Never Smoker  Smokeless Tobacco  . Never Used    Labs: Recent Review [employer]  for [facility] Cardiac and Pulmonary Rehab Latest Ref Rng & Units 09/15/2016   Cholestrol 0 - 200 mg/dL 295(A)   LDLCALC 0 - 99 mg/dL 213(Y)   HDL >86 mg/dL 48   Trlycerides <578 mg/dL 469      Capillary Blood Glucose: No results found for: GLUCAP   Exercise Target Goals:    Exercise Program Goal: Individual exercise prescription set with THRR, safety & activity barriers. Participant demonstrates ability to understand and report RPE using BORG scale, to self-measure pulse accurately, and to acknowledge the importance of the exercise prescription.  Exercise Prescription Goal: Starting with aerobic activity 30 plus minutes a day, 3 days per week for initial exercise prescription. Provide home exercise  prescription and guidelines that participant acknowledges understanding prior to discharge.  Activity Barriers & Risk Stratification:     Activity Barriers & Cardiac Risk Stratification - 11/03/16 1456      Activity Barriers & Cardiac Risk Stratification   Activity Barriers Back Problems;Muscular Weakness;Deconditioning;Other (comment)   Comments R>L knee pain, back surgery in 2014   Cardiac Risk Stratification High      6 Minute Walk:     6 Minute Walk    Row Name 11/03/16 1434 11/03/16 1535       6 Minute Walk   Phase Initial  -    Distance 1400 feet  -    Walk Time 6 minutes  -    # of Rest Breaks 0  -    MPH 2.65  -    METS 3.1  -    RPE 12  -    VO2 Peak 10.8  -    Symptoms Yes (comment)  -    Comments c/o fatigue at end of walk test  -    Resting HR 73 bpm  -    Resting BP 100/70  -    Max Ex. HR 100 bpm  -    Max Ex. BP 138/80  -    2 Minute Post BP  - 132/78       Oxygen Initial Assessment:   Oxygen Re-Evaluation:   Oxygen Discharge (Final Oxygen Re-Evaluation):   Initial Exercise Prescription:     Initial Exercise Prescription - 11/03/16 1500      Date of Initial Exercise RX and Referring Provider   Date 11/03/16   Referring Provider Rinaldo Cloud MD     Treadmill   MPH 2.2   Grade 1   Minutes 10   METs 2.99     Recumbant Bike   Level 1.5   Minutes 10   METs 2     NuStep   Level 3   SPM 70   Minutes 10   METs 2     Prescription Details   Frequency (times per week) 3   Duration Progress to 30 minutes of continuous aerobic without signs/symptoms of physical distress     Intensity   THRR 40-80% of Max Heartrate 60-119   Ratings of Perceived Exertion 11-13   Perceived Dyspnea 0-4     Progression   Progression Continue to progress workloads to maintain intensity without signs/symptoms of physical distress.     Resistance Training   Training Prescription Yes   Weight 2lbs   Reps 10-15      Perform Capillary Blood  Glucose checks as needed.  Exercise Prescription Changes:      Exercise Prescription Changes    Row Name 11/08/16 1600 11/24/16 1600  Response to Exercise   Blood Pressure (Admit) 131/70 110/78      Blood Pressure (Exercise) 132/80 118/68      Blood Pressure (Exit) 128/70 110/78      Heart Rate (Admit) 74 bpm 64 bpm      Heart Rate (Exercise) 101 bpm 93 bpm      Heart Rate (Exit) 71 bpm 64 bpm      Rating of Perceived Exertion (Exercise) 13 10      Comments Pt completed first exercise session on 11/07/16 without difficulty Pt completed first exercise session on 11/07/16 without difficulty      Duration Continue with 30 min of aerobic exercise without signs/symptoms of physical distress. Continue with 30 min of aerobic exercise without signs/symptoms of physical distress.      Intensity THRR unchanged THRR unchanged        Progression   Progression  - Continue to progress workloads to maintain intensity without signs/symptoms of physical distress.      Average METs  - 2.6        Resistance Training   Training Prescription Yes Yes      Weight 2lbs 2lbs      Reps 10-15 10-15      Time 10 Minutes 10 Minutes        Treadmill   MPH 2.2 2.2      Grade 1 1      Minutes 10 10      METs 2.99 2.99        Recumbant Bike   Level 1.5 3      Minutes 10 10      METs 2 1.9        NuStep   Level 3 3      SPM 70 70      Minutes 10 10      METs 1.9 2.9        Home Exercise Plan   Plans to continue exercise at  - Home (comment)      Frequency  - Add 3 additional days to program exercise sessions.      Initial Home Exercises Provided  - 11/23/16         Exercise Comments:      Exercise Comments    Row Name 11/08/16 1629 11/30/16 1707         Exercise Comments Pt responded well to exercise on 11/07/16. Pt was oriented to exercise equipment and exercise for 30 minute without difficulty Reviewed METs and goals with pt.  Pt is doing well with exercise.           Exercise Goals and Review:      Exercise Goals    Row Name 11/03/16 1456             Exercise Goals   Increase Physical Activity Yes       Intervention Provide advice, education, support and counseling about physical activity/exercise needs.;Develop an individualized exercise prescription for aerobic and resistive training based on initial evaluation findings, risk stratification, comorbidities and participant's personal goals.       Expected Outcomes Achievement of increased cardiorespiratory fitness and enhanced flexibility, muscular endurance and strength shown through measurements of functional capacity and personal statement of participant.       Increase Strength and Stamina Yes       Intervention Provide advice, education, support and counseling about physical activity/exercise needs.;Develop an individualized exercise prescription for aerobic and resistive training based on initial evaluation findings, risk  stratification, comorbidities and participant's personal goals.       Expected Outcomes Achievement of increased cardiorespiratory fitness and enhanced flexibility, muscular endurance and strength shown through measurements of functional capacity and personal statement of participant.          Exercise Goals Re-Evaluation :     Exercise Goals Re-Evaluation    Row Name 11/30/16 1705             Exercise Goal Re-Evaluation   Exercise Goals Review Increase Physical Activity;Increase Strenth and Stamina       Comments Pt states that exercising feels easier for her than it did when she first started and she feels like she is stronger       Expected Outcomes continue with exercise Rx in order to continue to increase cardiorespiratory fitness.           Discharge Exercise Prescription (Final Exercise Prescription Changes):     Exercise Prescription Changes - 11/24/16 1600      Response to Exercise   Blood Pressure (Admit) 110/78   Blood Pressure (Exercise)  118/68   Blood Pressure (Exit) 110/78   Heart Rate (Admit) 64 bpm   Heart Rate (Exercise) 93 bpm   Heart Rate (Exit) 64 bpm   Rating of Perceived Exertion (Exercise) 10   Comments Pt completed first exercise session on 11/07/16 without difficulty   Duration Continue with 30 min of aerobic exercise without signs/symptoms of physical distress.   Intensity THRR unchanged     Progression   Progression Continue to progress workloads to maintain intensity without signs/symptoms of physical distress.   Average METs 2.6     Resistance Training   Training Prescription Yes   Weight 2lbs   Reps 10-15   Time 10 Minutes     Treadmill   MPH 2.2   Grade 1   Minutes 10   METs 2.99     Recumbant Bike   Level 3   Minutes 10   METs 1.9     NuStep   Level 3   SPM 70   Minutes 10   METs 2.9     Home Exercise Plan   Plans to continue exercise at Home (comment)   Frequency Add 3 additional days to program exercise sessions.   Initial Home Exercises Provided 11/23/16      Nutrition:  Target Goals: Understanding of nutrition guidelines, daily intake of sodium 1500mg , cholesterol 200mg , calories 30% from fat and 7% or less from saturated fats, daily to have 5 or more servings of fruits and vegetables.  Biometrics:     Pre Biometrics - 11/03/16 1553      Pre Biometrics   Weight 161 lb 6 oz (73.2 kg)       Nutrition Therapy Plan and Nutrition Goals:     Nutrition Therapy & Goals - 11/03/16 1419      Nutrition Therapy   Diet Therapeutic Lifestyle Changes     Intervention Plan   Intervention Prescribe, educate and counsel regarding individualized specific dietary modifications aiming towards targeted core components such as weight, hypertension, lipid management, diabetes, heart failure and other comorbidities.;Nutrition handout(s) given to patient.  Constipation Nutrition Therapy   Expected Outcomes Short Term Goal: Understand basic principles of dietary content, such as  calories, fat, sodium, cholesterol and nutrients.;Long Term Goal: Adherence to prescribed nutrition plan.      Nutrition Discharge: Nutrition Scores:   Nutrition Goals Re-Evaluation:   Nutrition Goals Re-Evaluation:   Nutrition Goals Discharge (Final Nutrition  Goals Re-Evaluation):   Psychosocial: Target Goals: Acknowledge presence or absence of significant depression and/or stress, maximize coping skills, provide positive support system. Participant is able to verbalize types and ability to use techniques and skills needed for reducing stress and depression.  Initial Review & Psychosocial Screening:     Initial Psych Review & Screening - 11/03/16 1627      Initial Review   Current issues with None Identified     Family Dynamics   Good Support System? Yes     Barriers   Psychosocial barriers to participate in program There are no identifiable barriers or psychosocial needs.     Screening Interventions   Interventions Encouraged to exercise      Quality of Life Scores:     Quality of Life - 11/11/16 1605      Quality of Life Scores   Health/Function Pre 26.14 %  overall quality of life scores excellent.  pt only mild concern is her recent cardiac event.  pt is encouraged that she is able to participate in CR activities. pt is seeking to establish care with [facility].  pt is awaiting scheduling phone call.     Socioeconomic Pre 26.57 %   Psych/Spiritual Pre 29 %   Family Pre 30 %   GLOBAL Pre 27.38 %      PHQ-9: Recent Review Flowsheet Data    Depression screen [facility] 2/9 11/07/2016   Decreased Interest 0   Down, Depressed, Hopeless 0   PHQ - 2 Score 0     Interpretation of Total Score  Total Score Depression Severity:  1-4 = Minimal depression, 5-9 = Mild depression, 10-14 = Moderate depression, 15-19 = Moderately severe depression, 20-27 = Severe depression   Psychosocial Evaluation and Intervention:     Psychosocial Evaluation - 11/07/16 1621       Psychosocial Evaluation & Interventions   Interventions Encouraged to exercise with the program and follow exercise prescription   Comments no psychosocial needs identified, no interventions necessary    Expected Outcomes pt will exhibit positive outlook with good coping skills.    Continue Psychosocial Services  No Follow up required      Psychosocial Re-Evaluation:     Psychosocial Re-Evaluation    Row Name 12/06/16 1226             Psychosocial Re-Evaluation   Current issues with None Identified       Interventions Encouraged to attend Cardiac Rehabilitation for the exercise       Continue Psychosocial Services  No Follow up required          Psychosocial Discharge (Final Psychosocial Re-Evaluation):     Psychosocial Re-Evaluation - 12/06/16 1226      Psychosocial Re-Evaluation   Current issues with None Identified   Interventions Encouraged to attend Cardiac Rehabilitation for the exercise   Continue Psychosocial Services  No Follow up required      Vocational Rehabilitation: Provide vocational rehab assistance to qualifying candidates.   Vocational Rehab Evaluation & Intervention:     Vocational Rehab - 11/07/16 1620      Initial Vocational Rehab Evaluation & Intervention   Assessment shows need for Vocational Rehabilitation No      Education: Education Goals: Education classes will be provided on a weekly basis, covering required topics. Participant will state understanding/return demonstration of topics presented.  Learning Barriers/Preferences:     Learning Barriers/Preferences - 11/03/16 1455      Learning Barriers/Preferences   Learning Barriers Sight  Learning Preferences Written Material;Video;Pictoral;Skilled Demonstration      Education Topics: Count Your Pulse:  -Group instruction provided by verbal instruction, demonstration, patient participation and written materials to support subject.  Instructors address importance of being  able to find your pulse and how to count your pulse when at home without a heart monitor.  Patients get hands on experience counting their pulse with staff help and individually.   CARDIAC REHAB PHASE II EXERCISE from 11/30/2016 in [facility] CARDIAC REHAB  Date  11/18/16  Instruction Review Code  2- meets goals/outcomes      Heart Attack, Angina, and Risk Factor Modification:  -Group instruction provided by verbal instruction, video, and written materials to support subject.  Instructors address signs and symptoms of angina and heart attacks.    Also discuss risk factors for heart disease and how to make changes to improve heart health risk factors.   Functional Fitness:  -Group instruction provided by verbal instruction, demonstration, patient participation, and written materials to support subject.  Instructors address safety measures for doing things around the house.  Discuss how to get up and down off the floor, how to pick things up properly, how to safely get out of a chair without assistance, and balance training.   Meditation and Mindfulness:  -Group instruction provided by verbal instruction, patient participation, and written materials to support subject.  Instructor addresses importance of mindfulness and meditation practice to help reduce stress and improve awareness.  Instructor also leads participants through a meditation exercise.    Stretching for Flexibility and Mobility:  -Group instruction provided by verbal instruction, patient participation, and written materials to support subject.  Instructors lead participants through series of stretches that are designed to increase flexibility thus improving mobility.  These stretches are additional exercise for major muscle groups that are typically performed during regular warm up and cool down.   CARDIAC REHAB PHASE II EXERCISE from 11/30/2016 in [facility] CARDIAC REHAB  Date  11/11/16   Instruction Review Code  2- meets goals/outcomes      Hands Only CPR:  -Group verbal, video, and participation provides a basic overview of AHA guidelines for community CPR. Role-play of emergencies allow participants the opportunity to practice calling for help and chest compression technique with discussion of AED use.   Hypertension: -Group verbal and written instruction that provides a basic overview of hypertension including the most recent diagnostic guidelines, risk factor reduction with self-care instructions and medication management.    Nutrition I class: Heart Healthy Eating:  -Group instruction provided by PowerPoint slides, verbal discussion, and written materials to support subject matter. The instructor gives an explanation and review of the Therapeutic Lifestyle Changes diet recommendations, which includes a discussion on lipid goals, dietary fat, sodium, fiber, plant stanol/sterol esters, sugar, and the components of a well-balanced, healthy diet.   CARDIAC REHAB PHASE II EXERCISE from 11/30/2016 in [facility] CARDIAC REHAB  Date  11/03/16  Educator  RD  Instruction Review Code  Not applicable [class handouts given]      Nutrition II class: Lifestyle Skills:  -Group instruction provided by PowerPoint slides, verbal discussion, and written materials to support subject matter. The instructor gives an explanation and review of label reading, grocery shopping for heart health, heart healthy recipe modifications, and ways to make healthier choices when eating out.   CARDIAC REHAB PHASE II EXERCISE from 11/30/2016 in [facility] CARDIAC REHAB  Date  11/03/16  Educator  RD  Instruction Review Code  Not applicable [class handouts given]      Diabetes Question & Answer:  -Group instruction provided by PowerPoint slides, verbal discussion, and written materials to support subject matter. The instructor gives an explanation and review of  diabetes co-morbidities, pre- and post-prandial blood glucose goals, pre-exercise blood glucose goals, signs, symptoms, and treatment of hypoglycemia and hyperglycemia, and foot care basics.   Diabetes Blitz:  -Group instruction provided by PowerPoint slides, verbal discussion, and written materials to support subject matter. The instructor gives an explanation and review of the physiology behind type 1 and type 2 diabetes, diabetes medications and rational behind using different medications, pre- and post-prandial blood glucose recommendations and Hemoglobin A1c goals, diabetes diet, and exercise including blood glucose guidelines for exercising safely.    Portion Distortion:  -Group instruction provided by PowerPoint slides, verbal discussion, written materials, and food models to support subject matter. The instructor gives an explanation of serving size versus portion size, changes in portions sizes over the last 20 years, and what consists of a serving from each food group.   Stress Management:  -Group instruction provided by verbal instruction, video, and written materials to support subject matter.  Instructors review role of stress in heart disease and how to cope with stress positively.     CARDIAC REHAB PHASE [facility]  Date  11/16/16  Instruction Review Code  2- meets goals/outcomes      Exercising on Your Own:  -Group instruction provided by verbal instruction, power point, and written materials to support subject.  Instructors discuss benefits of exercise, components of exercise, frequency and intensity of exercise, and end points for exercise.  Also discuss use of nitroglycerin and activating EMS.  Review options of places to exercise outside of rehab.  Review guidelines for sex with heart disease.   CARDIAC REHAB PHASE [facility]  Date  11/30/16   Instruction Review Code  2- meets goals/outcomes      Cardiac Drugs I:  -Group instruction provided by verbal instruction and written materials to support subject.  Instructor reviews cardiac drug classes: antiplatelets, anticoagulants, beta blockers, and statins.  Instructor discusses reasons, side effects, and lifestyle considerations for each drug class.   Cardiac Drugs II:  -Group instruction provided by verbal instruction and written materials to support subject.  Instructor reviews cardiac drug classes: angiotensin converting enzyme inhibitors (ACE-I), angiotensin II receptor blockers (ARBs), nitrates, and calcium channel blockers.  Instructor discusses reasons, side effects, and lifestyle considerations for each drug class.   CARDIAC REHAB PHASE II EXERCISE from 11/30/2016 in [facility] CARDIAC REHAB  Date  11/23/16  Instruction Review Code  2- meets goals/outcomes      Anatomy and Physiology of the Circulatory System:  Group verbal and written instruction and models provide basic cardiac anatomy and physiology, with the coronary electrical and arterial systems. Review of: AMI, Angina, Valve disease, Heart Failure, Peripheral Artery Disease, Cardiac Arrhythmia, Pacemakers, and the ICD.   Other Education:  -Group or individual verbal, written, or video instructions that support the educational goals of the cardiac rehab program.   Knowledge Questionnaire Score:     Knowledge Questionnaire Score - 11/03/16 1432      Knowledge Questionnaire Score   Pre Score 21/24      Core Components/Risk Factors/Patient Goals at Admission:     Personal Goals and Risk Factors at  Admission - 11/03/16 1546      Core Components/Risk Factors/Patient Goals on Admission    Weight Management Yes;Weight Maintenance;Weight Loss   Intervention Weight Management: Develop a combined nutrition and exercise program designed to reach desired caloric intake, while maintaining  appropriate intake of nutrient and fiber, sodium and fats, and appropriate energy expenditure required for the weight goal.;Weight Management: Provide education and appropriate resources to help participant work on and attain dietary goals.;Weight Management/Obesity: Establish reasonable short term and long term weight goals.   Expected Outcomes Weight Maintenance: Understanding of the daily nutrition guidelines, which includes 25-35% calories from fat, 7% or less cal from saturated fats, less than 200mg  cholesterol, less than 1.5gm of sodium, & 5 or more servings of fruits and vegetables daily;Short Term: Continue to assess and modify interventions until short term weight is achieved;Long Term: Adherence to nutrition and physical activity/exercise program aimed toward attainment of established weight goal;Weight Loss: Understanding of general recommendations for a balanced deficit meal plan, which promotes 1-2 lb weight loss per week and includes a negative energy balance of [phone] kcal/d;Understanding recommendations for meals to include 15-35% energy as protein, 25-35% energy from fat, 35-60% energy from carbohydrates, less than 200mg  of dietary cholesterol, 20-35 gm of total fiber daily;Understanding of distribution of calorie intake throughout the day with the consumption of 4-5 meals/snacks   Hypertension Yes   Intervention Provide education on lifestyle modifcations including regular physical activity/exercise, weight management, moderate sodium restriction and increased consumption of fresh fruit, vegetables, and low fat dairy, alcohol moderation, and smoking cessation.;Monitor prescription use compliance.   Expected Outcomes Long Term: Maintenance of blood pressure at goal levels.;Short Term: Continued assessment and intervention until BP is < 140/50mm HG in hypertensive participants. < 130/72mm HG in hypertensive participants with diabetes, heart failure or chronic kidney disease.   Lipids Yes    Intervention Provide education and support for participant on nutrition & aerobic/resistive exercise along with prescribed medications to achieve LDL 70mg , HDL >40mg .   Expected Outcomes Short Term: Participant states understanding of desired cholesterol values and is compliant with medications prescribed. Participant is following exercise prescription and nutrition guidelines.;Long Term: Cholesterol controlled with medications as prescribed, with individualized exercise RX and with personalized nutrition plan. Value goals: LDL < 70mg , HDL > 40 mg.      Core Components/Risk Factors/Patient Goals Review:    Core Components/Risk Factors/Patient Goals at Discharge (Final Review):    ITP Comments:     ITP Comments    Row Name 11/03/16 1432           ITP Comments Medical Director, Dr. Armanda Magic          Comments: Ayelet  is making expected progress toward personal goals after completing 13 sessions. Recommend continued exercise and life style modification education including  stress management and relaxation techniques to decrease cardiac risk profile. Jan is doing well with exercise.Gladstone Lighter, RN,BSN 12/07/2016 5:07 PM

## 2016-12-07 ENCOUNTER — Encounter (HOSPITAL_COMMUNITY): Payer: PPO

## 2016-12-07 ENCOUNTER — Encounter (HOSPITAL_COMMUNITY)
Admission: RE | Admit: 2016-12-07 | Discharge: 2016-12-07 | Disposition: A | Discharge status: Home / Self Care | Payer: PPO | Source: Ambulatory Visit | Attending: Cardiology | Admitting: Cardiology

## 2016-12-07 DIAGNOSIS — Z955 Presence of coronary angioplasty implant and graft: Secondary | ICD-10-CM

## 2016-12-07 DIAGNOSIS — I214 Non-ST elevation (NSTEMI) myocardial infarction: Secondary | ICD-10-CM | POA: Diagnosis not present

## 2016-12-09 ENCOUNTER — Encounter (HOSPITAL_COMMUNITY): Payer: PPO

## 2016-12-14 ENCOUNTER — Encounter (HOSPITAL_COMMUNITY): Payer: PPO

## 2016-12-14 ENCOUNTER — Encounter (HOSPITAL_COMMUNITY)
Admission: RE | Admit: 2016-12-14 | Discharge: 2016-12-14 | Disposition: A | Discharge status: Home / Self Care | Payer: PPO | Source: Ambulatory Visit | Attending: Cardiology | Admitting: Cardiology

## 2016-12-14 DIAGNOSIS — I214 Non-ST elevation (NSTEMI) myocardial infarction: Secondary | ICD-10-CM | POA: Diagnosis not present

## 2016-12-14 DIAGNOSIS — Z955 Presence of coronary angioplasty implant and graft: Secondary | ICD-10-CM

## 2016-12-16 ENCOUNTER — Encounter (HOSPITAL_COMMUNITY): Payer: PPO

## 2016-12-16 ENCOUNTER — Encounter (HOSPITAL_COMMUNITY)
Admission: RE | Admit: 2016-12-16 | Discharge: 2016-12-16 | Disposition: A | Discharge status: Home / Self Care | Payer: PPO | Source: Ambulatory Visit | Attending: Cardiology | Admitting: Cardiology

## 2016-12-16 DIAGNOSIS — E7800 Pure hypercholesterolemia, unspecified: Secondary | ICD-10-CM | POA: Diagnosis not present

## 2016-12-16 DIAGNOSIS — I214 Non-ST elevation (NSTEMI) myocardial infarction: Secondary | ICD-10-CM

## 2016-12-16 DIAGNOSIS — I10 Essential (primary) hypertension: Secondary | ICD-10-CM | POA: Diagnosis not present

## 2016-12-16 DIAGNOSIS — M1990 Unspecified osteoarthritis, unspecified site: Secondary | ICD-10-CM | POA: Diagnosis not present

## 2016-12-16 DIAGNOSIS — Z7982 Long term (current) use of aspirin: Secondary | ICD-10-CM | POA: Diagnosis not present

## 2016-12-16 DIAGNOSIS — I2510 Atherosclerotic heart disease of native coronary artery without angina pectoris: Secondary | ICD-10-CM | POA: Insufficient documentation

## 2016-12-16 DIAGNOSIS — Z79899 Other long term (current) drug therapy: Secondary | ICD-10-CM | POA: Diagnosis not present

## 2016-12-16 DIAGNOSIS — K219 Gastro-esophageal reflux disease without esophagitis: Secondary | ICD-10-CM | POA: Diagnosis not present

## 2016-12-16 DIAGNOSIS — Z955 Presence of coronary angioplasty implant and graft: Secondary | ICD-10-CM | POA: Insufficient documentation

## 2016-12-19 ENCOUNTER — Encounter (HOSPITAL_COMMUNITY): Payer: PPO

## 2016-12-19 ENCOUNTER — Encounter (HOSPITAL_COMMUNITY)
Admission: RE | Admit: 2016-12-19 | Discharge: 2016-12-19 | Disposition: A | Discharge status: Home / Self Care | Payer: PPO | Source: Ambulatory Visit | Attending: Cardiology | Admitting: Cardiology

## 2016-12-19 DIAGNOSIS — I214 Non-ST elevation (NSTEMI) myocardial infarction: Secondary | ICD-10-CM

## 2016-12-19 DIAGNOSIS — Z955 Presence of coronary angioplasty implant and graft: Secondary | ICD-10-CM

## 2016-12-21 ENCOUNTER — Encounter (HOSPITAL_COMMUNITY): Payer: PPO

## 2016-12-23 ENCOUNTER — Encounter (HOSPITAL_COMMUNITY): Payer: PPO

## 2016-12-26 ENCOUNTER — Encounter (HOSPITAL_COMMUNITY): Payer: PPO

## 2016-12-26 ENCOUNTER — Encounter (HOSPITAL_COMMUNITY)
Admission: RE | Admit: 2016-12-26 | Discharge: 2016-12-26 | Disposition: A | Discharge status: Home / Self Care | Payer: PPO | Source: Ambulatory Visit | Attending: Cardiology | Admitting: Cardiology

## 2016-12-26 DIAGNOSIS — I10 Essential (primary) hypertension: Secondary | ICD-10-CM | POA: Diagnosis not present

## 2016-12-26 DIAGNOSIS — Z955 Presence of coronary angioplasty implant and graft: Secondary | ICD-10-CM | POA: Diagnosis not present

## 2016-12-26 DIAGNOSIS — E7800 Pure hypercholesterolemia, unspecified: Secondary | ICD-10-CM | POA: Diagnosis not present

## 2016-12-26 DIAGNOSIS — I252 Old myocardial infarction: Secondary | ICD-10-CM | POA: Diagnosis not present

## 2016-12-26 DIAGNOSIS — I214 Non-ST elevation (NSTEMI) myocardial infarction: Secondary | ICD-10-CM

## 2016-12-28 ENCOUNTER — Encounter (HOSPITAL_COMMUNITY): Payer: PPO

## 2016-12-28 ENCOUNTER — Encounter (HOSPITAL_COMMUNITY)
Admission: RE | Admit: 2016-12-28 | Discharge: 2016-12-28 | Disposition: A | Discharge status: Home / Self Care | Payer: PPO | Source: Ambulatory Visit | Attending: Cardiology | Admitting: Cardiology

## 2016-12-28 DIAGNOSIS — I214 Non-ST elevation (NSTEMI) myocardial infarction: Secondary | ICD-10-CM

## 2016-12-28 DIAGNOSIS — Z955 Presence of coronary angioplasty implant and graft: Secondary | ICD-10-CM

## 2016-12-30 ENCOUNTER — Encounter (HOSPITAL_COMMUNITY): Payer: PPO

## 2016-12-30 ENCOUNTER — Encounter (HOSPITAL_COMMUNITY)
Admission: RE | Admit: 2016-12-30 | Discharge: 2016-12-30 | Disposition: A | Discharge status: Home / Self Care | Payer: PPO | Source: Ambulatory Visit | Attending: Cardiology | Admitting: Cardiology

## 2016-12-30 DIAGNOSIS — I214 Non-ST elevation (NSTEMI) myocardial infarction: Secondary | ICD-10-CM

## 2016-12-30 DIAGNOSIS — Z955 Presence of coronary angioplasty implant and graft: Secondary | ICD-10-CM

## 2017-01-02 ENCOUNTER — Encounter (HOSPITAL_COMMUNITY): Payer: PPO

## 2017-01-02 ENCOUNTER — Encounter (HOSPITAL_COMMUNITY)
Admission: RE | Admit: 2017-01-02 | Discharge: 2017-01-02 | Disposition: A | Discharge status: Home / Self Care | Payer: PPO | Source: Ambulatory Visit | Attending: Cardiology | Admitting: Cardiology

## 2017-01-02 DIAGNOSIS — I214 Non-ST elevation (NSTEMI) myocardial infarction: Secondary | ICD-10-CM | POA: Diagnosis not present

## 2017-01-04 ENCOUNTER — Encounter (HOSPITAL_COMMUNITY): Payer: PPO

## 2017-01-04 ENCOUNTER — Encounter (HOSPITAL_COMMUNITY)
Admission: RE | Admit: 2017-01-04 | Discharge: 2017-01-04 | Disposition: A | Discharge status: Home / Self Care | Payer: PPO | Source: Ambulatory Visit | Attending: Cardiology | Admitting: Cardiology

## 2017-01-04 DIAGNOSIS — I214 Non-ST elevation (NSTEMI) myocardial infarction: Secondary | ICD-10-CM | POA: Diagnosis not present

## 2017-01-04 DIAGNOSIS — Z955 Presence of coronary angioplasty implant and graft: Secondary | ICD-10-CM

## 2017-01-04 NOTE — Progress Notes (Signed)
Cardiac Individual Treatment Plan  Patient Details  Name: [name] [name] MRN: [id number] Date of Birth: May 17, 1946 Referring Provider:     CARDIAC REHAB PHASE II ORIENTATION from 11/03/2016 in [facility] CARDIAC REHAB  Referring Provider  Rinaldo Cloud MD      Initial Encounter Date:    CARDIAC REHAB PHASE II ORIENTATION from 11/03/2016 in [facility] CARDIAC REHAB  Date  11/03/16  Referring Provider  Rinaldo Cloud MD      Visit Diagnosis: 09/15/16 Status post coronary artery stent placement DES ost LAD  Acute non Q wave MI (myocardial infarction), initial episode of care [facility])  Patient's Home Medications on Admission:  Current Outpatient Prescriptions:  .  acetaminophen (TYLENOL) 500 MG tablet, Take 500 mg by mouth every 6 (six) hours as needed for mild pain., Disp: , Rfl:  .  albuterol (PROVENTIL HFA;VENTOLIN HFA) 108 (90 Base) MCG/ACT inhaler, Inhale 2 puffs into the lungs 4 (four) times daily as needed., Disp: , Rfl:  .  aspirin 81 MG chewable tablet, Chew 81 mg by mouth daily., Disp: , Rfl:  .  atorvastatin (LIPITOR) 20 MG tablet, Take 10 mg by mouth daily. , Disp: , Rfl:  .  baclofen (LIORESAL) 10 MG tablet, Take 10 mg by mouth daily., Disp: , Rfl:  .  docusate sodium (COLACE) 100 MG capsule, Take 100 mg by mouth 2 (two) times daily., Disp: , Rfl:  .  fexofenadine (ALLEGRA) 180 MG tablet, Take 180 mg by mouth daily., Disp: , Rfl:  .  lisinopril (PRINIVIL,ZESTRIL) 20 MG tablet, Take 20 mg by mouth daily., Disp: , Rfl:  .  metoprolol tartrate (LOPRESSOR) 25 MG tablet, Take 0.5 tablets (12.5 mg total) by mouth 2 (two) times daily., Disp: 30 tablet, Rfl: 3 .  Multiple Vitamin (MULTIVITAMIN WITH MINERALS) TABS tablet, Take 1 tablet by mouth daily. centrum silver, Disp: , Rfl:  .  nitroGLYCERIN (NITROSTAT) 0.4 MG SL tablet, Place 1 tablet under the tongue every 5 (five) minutes x 3 doses as needed., Disp: , Rfl:  .  nortriptyline (PAMELOR) 25 MG  capsule, Take 50 mg by mouth daily., Disp: , Rfl:  .  Polyethyl Glycol-Propyl Glycol (SYSTANE OP), Place 1 drop into both eyes daily as needed. For dry eyes, Disp: , Rfl:  .  ticagrelor (BRILINTA) 90 MG TABS tablet, Take 1 tablet (90 mg total) by mouth 2 (two) times daily., Disp: 180 tablet, Rfl: 4  Past Medical History: Past Medical History:  Diagnosis Date  . Arthritis    "knees, hips, hands, back" (09/15/2016)  . Constipation   . Coronary artery disease   . Exercise-induced asthma   . Family history of adverse reaction to anesthesia    "daughter gets PONV too"  . GERD (gastroesophageal reflux disease)   . Heart murmur dx'd 09/14/2016  . High cholesterol   . Hypertension   . Migraine    "recently had series of injections; none since; they had been weekly 1 month ago" (09/15/2016)  . Necrotizing fasciitis (HCC)    R index finger- had a amp  . NSTEMI (non-ST elevated myocardial infarction) (HCC)   . Pneumonia ~ 2003  . PONV (postoperative nausea and vomiting)    Patch helps  . RSD (reflex sympathetic dystrophy)    Right arm after surgery    Tobacco Use: History  Smoking Status  . Never Smoker  Smokeless Tobacco  . Never Used    Labs: Recent Review [employer]  for ITP Cardiac and Pulmonary Rehab Latest Ref Rng & Units 09/15/2016   Cholestrol 0 - 200 mg/dL 161(W)   LDLCALC 0 - 99 mg/dL 960(A)   HDL >54 mg/dL 48   Trlycerides <098 mg/dL 119      Capillary Blood Glucose: No results found for: GLUCAP   Exercise Target Goals:    Exercise Program Goal: Individual exercise prescription set with THRR, safety & activity barriers. Participant demonstrates ability to understand and report RPE using BORG scale, to self-measure pulse accurately, and to acknowledge the importance of the exercise prescription.  Exercise Prescription Goal: Starting with aerobic activity 30 plus minutes a day, 3 days per week for initial exercise prescription. Provide home exercise  prescription and guidelines that participant acknowledges understanding prior to discharge.  Activity Barriers & Risk Stratification:     Activity Barriers & Cardiac Risk Stratification - 11/03/16 1456      Activity Barriers & Cardiac Risk Stratification   Activity Barriers Back Problems;Muscular Weakness;Deconditioning;Other (comment)   Comments R>L knee pain, back surgery in 2014   Cardiac Risk Stratification High      6 Minute Walk:     6 Minute Walk    Row Name 11/03/16 1434 11/03/16 1535       6 Minute Walk   Phase Initial  -    Distance 1400 feet  -    Walk Time 6 minutes  -    # of Rest Breaks 0  -    MPH 2.65  -    METS 3.1  -    RPE 12  -    VO2 Peak 10.8  -    Symptoms Yes (comment)  -    Comments c/o fatigue at end of walk test  -    Resting HR 73 bpm  -    Resting BP 100/70  -    Max Ex. HR 100 bpm  -    Max Ex. BP 138/80  -    2 Minute Post BP  - 132/78       Oxygen Initial Assessment:   Oxygen Re-Evaluation:   Oxygen Discharge (Final Oxygen Re-Evaluation):   Initial Exercise Prescription:     Initial Exercise Prescription - 11/03/16 1500      Date of Initial Exercise RX and Referring Provider   Date 11/03/16   Referring Provider Rinaldo Cloud MD     Treadmill   MPH 2.2   Grade 1   Minutes 10   METs 2.99     Recumbant Bike   Level 1.5   Minutes 10   METs 2     NuStep   Level 3   SPM 70   Minutes 10   METs 2     Prescription Details   Frequency (times per week) 3   Duration Progress to 30 minutes of continuous aerobic without signs/symptoms of physical distress     Intensity   THRR 40-80% of Max Heartrate 60-119   Ratings of Perceived Exertion 11-13   Perceived Dyspnea 0-4     Progression   Progression Continue to progress workloads to maintain intensity without signs/symptoms of physical distress.     Resistance Training   Training Prescription Yes   Weight 2lbs   Reps 10-15      Perform Capillary Blood  Glucose checks as needed.  Exercise Prescription Changes:     Exercise Prescription Changes    Row Name 11/08/16 1600 11/24/16 1600 01/04/17 1400  Response to Exercise   Blood Pressure (Admit) 131/70 110/78 129/72     Blood Pressure (Exercise) 132/80 118/68 138/70     Blood Pressure (Exit) 128/70 110/78 117/75     Heart Rate (Admit) 74 bpm 64 bpm 84 bpm     Heart Rate (Exercise) 101 bpm 93 bpm 118 bpm     Heart Rate (Exit) 71 bpm 64 bpm 78 bpm     Rating of Perceived Exertion (Exercise) 13 10 11      Comments Pt completed first exercise session on 11/07/16 without difficulty Pt completed first exercise session on 11/07/16 without difficulty Pt completed first exercise session on 11/07/16 without difficulty     Duration Continue with 30 min of aerobic exercise without signs/symptoms of physical distress. Continue with 30 min of aerobic exercise without signs/symptoms of physical distress. Continue with 30 min of aerobic exercise without signs/symptoms of physical distress.     Intensity THRR unchanged THRR unchanged THRR unchanged       Progression   Progression  - Continue to progress workloads to maintain intensity without signs/symptoms of physical distress. Continue to progress workloads to maintain intensity without signs/symptoms of physical distress.     Average METs  - 2.6 3.1       Resistance Training   Training Prescription Yes Yes Yes     Weight 2lbs 2lbs 3lb     Reps 10-15 10-15 10-15     Time 10 Minutes 10 Minutes 10 Minutes       Treadmill   MPH 2.2 2.2 2.2     Grade 1 1 1      Minutes 10 10 10      METs 2.99 2.99 2.99       Recumbant Bike   Level 1.5 3 3      Minutes 10 10 10      METs 2 1.9 3.4       NuStep   Level 3 3 4      SPM 70 70 80     Minutes 10 10 10      METs 1.9 2.9 2.9       Home Exercise Plan   Plans to continue exercise at  - Home (comment) Home (comment)     Frequency  - Add 3 additional days to program exercise sessions. Add 3 additional  days to program exercise sessions.     Initial Home Exercises Provided  - 11/23/16 11/23/16        Exercise Comments:     Exercise Comments    Row Name 11/08/16 1629 11/30/16 1707 01/04/17 1415       Exercise Comments Pt responded well to exercise on 11/07/16. Pt was oriented to exercise equipment and exercise for 30 minute without difficulty Reviewed METs and goals with pt.  Pt is doing well with exercise.  Reviewed METs and goals with pt.        Exercise Goals and Review:     Exercise Goals    Row Name 11/03/16 1456             Exercise Goals   Increase Physical Activity Yes       Intervention Provide advice, education, support and counseling about physical activity/exercise needs.;Develop an individualized exercise prescription for aerobic and resistive training based on initial evaluation findings, risk stratification, comorbidities and participant's personal goals.       Expected Outcomes Achievement of increased cardiorespiratory fitness and enhanced flexibility, muscular endurance and strength shown through measurements of functional capacity and personal  statement of participant.       Increase Strength and Stamina Yes       Intervention Provide advice, education, support and counseling about physical activity/exercise needs.;Develop an individualized exercise prescription for aerobic and resistive training based on initial evaluation findings, risk stratification, comorbidities and participant's personal goals.       Expected Outcomes Achievement of increased cardiorespiratory fitness and enhanced flexibility, muscular endurance and strength shown through measurements of functional capacity and personal statement of participant.          Exercise Goals Re-Evaluation :     Exercise Goals Re-Evaluation    Row Name 11/30/16 1705 01/04/17 1414           Exercise Goal Re-Evaluation   Exercise Goals Review Increase Physical Activity;Increase Strenth and Stamina  Increase Physical Activity;Increase Strenth and Stamina      Comments Pt states that exercising feels easier for her than it did when she first started and she feels like she is stronger Pt continues to do well with exercise and she is walking at home on her off days from CR      Expected Outcomes continue with exercise Rx in order to continue to increase cardiorespiratory fitness. continue with exercise Rx in order to continue to increase cardiorespiratory fitness.          Discharge Exercise Prescription (Final Exercise Prescription Changes):     Exercise Prescription Changes - 01/04/17 1400      Response to Exercise   Blood Pressure (Admit) 129/72   Blood Pressure (Exercise) 138/70   Blood Pressure (Exit) 117/75   Heart Rate (Admit) 84 bpm   Heart Rate (Exercise) 118 bpm   Heart Rate (Exit) 78 bpm   Rating of Perceived Exertion (Exercise) 11   Comments Pt completed first exercise session on 11/07/16 without difficulty   Duration Continue with 30 min of aerobic exercise without signs/symptoms of physical distress.   Intensity THRR unchanged     Progression   Progression Continue to progress workloads to maintain intensity without signs/symptoms of physical distress.   Average METs 3.1     Resistance Training   Training Prescription Yes   Weight 3lb   Reps 10-15   Time 10 Minutes     Treadmill   MPH 2.2   Grade 1   Minutes 10   METs 2.99     Recumbant Bike   Level 3   Minutes 10   METs 3.4     NuStep   Level 4   SPM 80   Minutes 10   METs 2.9     Home Exercise Plan   Plans to continue exercise at Home (comment)   Frequency Add 3 additional days to program exercise sessions.   Initial Home Exercises Provided 11/23/16      Nutrition:  Target Goals: Understanding of nutrition guidelines, daily intake of sodium 1500mg , cholesterol 200mg , calories 30% from fat and 7% or less from saturated fats, daily to have 5 or more servings of fruits and  vegetables.  Biometrics:     Pre Biometrics - 11/03/16 1553      Pre Biometrics   Weight 161 lb 6 oz (73.2 kg)       Nutrition Therapy Plan and Nutrition Goals:     Nutrition Therapy & Goals - 11/03/16 1419      Nutrition Therapy   Diet Therapeutic Lifestyle Changes     Intervention Plan   Intervention Prescribe, educate and counsel regarding individualized specific dietary  modifications aiming towards targeted core components such as weight, hypertension, lipid management, diabetes, heart failure and other comorbidities.;Nutrition handout(s) given to patient.  Constipation Nutrition Therapy   Expected Outcomes Short Term Goal: Understand basic principles of dietary content, such as calories, fat, sodium, cholesterol and nutrients.;Long Term Goal: Adherence to prescribed nutrition plan.      Nutrition Discharge: Nutrition Scores:   Nutrition Goals Re-Evaluation:   Nutrition Goals Re-Evaluation:   Nutrition Goals Discharge (Final Nutrition Goals Re-Evaluation):   Psychosocial: Target Goals: Acknowledge presence or absence of significant depression and/or stress, maximize coping skills, provide positive support system. Participant is able to verbalize types and ability to use techniques and skills needed for reducing stress and depression.  Initial Review & Psychosocial Screening:     Initial Psych Review & Screening - 11/03/16 1627      Initial Review   Current issues with None Identified     Family Dynamics   Good Support System? Yes     Barriers   Psychosocial barriers to participate in program There are no identifiable barriers or psychosocial needs.     Screening Interventions   Interventions Encouraged to exercise      Quality of Life Scores:     Quality of Life - 11/11/16 1605      Quality of Life Scores   Health/Function Pre 26.14 %  overall quality of life scores excellent.  pt only mild concern is her recent cardiac event.  pt is encouraged  that she is able to participate in CR activities. pt is seeking to establish care with [facility].  pt is awaiting scheduling phone call.     Socioeconomic Pre 26.57 %   Psych/Spiritual Pre 29 %   Family Pre 30 %   GLOBAL Pre 27.38 %      PHQ-9: Recent Review Flowsheet Data    Depression screen [facility] 2/9 11/07/2016   Decreased Interest 0   Down, Depressed, Hopeless 0   PHQ - 2 Score 0     Interpretation of Total Score  Total Score Depression Severity:  1-4 = Minimal depression, 5-9 = Mild depression, 10-14 = Moderate depression, 15-19 = Moderately severe depression, 20-27 = Severe depression   Psychosocial Evaluation and Intervention:     Psychosocial Evaluation - 11/07/16 1621      Psychosocial Evaluation & Interventions   Interventions Encouraged to exercise with the program and follow exercise prescription   Comments no psychosocial needs identified, no interventions necessary    Expected Outcomes pt will exhibit positive outlook with good coping skills.    Continue Psychosocial Services  No Follow up required      Psychosocial Re-Evaluation:     Psychosocial Re-Evaluation    Row Name 12/06/16 1226 01/04/17 1702           Psychosocial Re-Evaluation   Current issues with None Identified None Identified      Interventions Encouraged to attend Cardiac Rehabilitation for the exercise Encouraged to attend Cardiac Rehabilitation for the exercise      Continue Psychosocial Services  No Follow up required No Follow up required         Psychosocial Discharge (Final Psychosocial Re-Evaluation):     Psychosocial Re-Evaluation - 01/04/17 1702      Psychosocial Re-Evaluation   Current issues with None Identified   Interventions Encouraged to attend Cardiac Rehabilitation for the exercise   Continue Psychosocial Services  No Follow up required      Vocational Rehabilitation: Provide vocational rehab assistance to qualifying  candidates.   Vocational Rehab  Evaluation & Intervention:     Vocational Rehab - 11/07/16 1620      Initial Vocational Rehab Evaluation & Intervention   Assessment shows need for Vocational Rehabilitation No      Education: Education Goals: Education classes will be provided on a weekly basis, covering required topics. Participant will state understanding/return demonstration of topics presented.  Learning Barriers/Preferences:     Learning Barriers/Preferences - 11/03/16 1455      Learning Barriers/Preferences   Learning Barriers Sight   Learning Preferences Written Material;Video;Pictoral;Skilled Demonstration      Education Topics: Count Your Pulse:  -Group instruction provided by verbal instruction, demonstration, patient participation and written materials to support subject.  Instructors address importance of being able to find your pulse and how to count your pulse when at home without a heart monitor.  Patients get hands on experience counting their pulse with staff help and individually.   CARDIAC REHAB PHASE II EXERCISE from 01/04/2017 in [facility] CARDIAC REHAB  Date  12/16/16  Instruction Review Code  2- meets goals/outcomes      Heart Attack, Angina, and Risk Factor Modification:  -Group instruction provided by verbal instruction, video, and written materials to support subject.  Instructors address signs and symptoms of angina and heart attacks.    Also discuss risk factors for heart disease and how to make changes to improve heart health risk factors.   Functional Fitness:  -Group instruction provided by verbal instruction, demonstration, patient participation, and written materials to support subject.  Instructors address safety measures for doing things around the house.  Discuss how to get up and down off the floor, how to pick things up properly, how to safely get out of a chair without assistance, and balance training.   Meditation and Mindfulness:  -Group instruction  provided by verbal instruction, patient participation, and written materials to support subject.  Instructor addresses importance of mindfulness and meditation practice to help reduce stress and improve awareness.  Instructor also leads participants through a meditation exercise.    Stretching for Flexibility and Mobility:  -Group instruction provided by verbal instruction, patient participation, and written materials to support subject.  Instructors lead participants through series of stretches that are designed to increase flexibility thus improving mobility.  These stretches are additional exercise for major muscle groups that are typically performed during regular warm up and cool down.   CARDIAC REHAB PHASE II EXERCISE from 01/04/2017 in [facility] CARDIAC REHAB  Date  11/11/16  Instruction Review Code  2- meets goals/outcomes      Hands Only CPR:  -Group verbal, video, and participation provides a basic overview of AHA guidelines for community CPR. Role-play of emergencies allow participants the opportunity to practice calling for help and chest compression technique with discussion of AED use.   Hypertension: -Group verbal and written instruction that provides a basic overview of hypertension including the most recent diagnostic guidelines, risk factor reduction with self-care instructions and medication management.    Nutrition I class: Heart Healthy Eating:  -Group instruction provided by PowerPoint slides, verbal discussion, and written materials to support subject matter. The instructor gives an explanation and review of the Therapeutic Lifestyle Changes diet recommendations, which includes a discussion on lipid goals, dietary fat, sodium, fiber, plant stanol/sterol esters, sugar, and the components of a well-balanced, healthy diet.   CARDIAC REHAB PHASE II EXERCISE from 01/04/2017 in [facility] CARDIAC REHAB  Date  11/03/16  Educator  RD   Instruction Review Code  Not applicable [class handouts given]      Nutrition II class: Lifestyle Skills:  -Group instruction provided by PowerPoint slides, verbal discussion, and written materials to support subject matter. The instructor gives an explanation and review of label reading, grocery shopping for heart health, heart healthy recipe modifications, and ways to make healthier choices when eating out.   CARDIAC REHAB PHASE II EXERCISE from 01/04/2017 in [facility] CARDIAC REHAB  Date  11/03/16  Educator  RD  Instruction Review Code  Not applicable [class handouts given]      Diabetes Question & Answer:  -Group instruction provided by PowerPoint slides, verbal discussion, and written materials to support subject matter. The instructor gives an explanation and review of diabetes co-morbidities, pre- and post-prandial blood glucose goals, pre-exercise blood glucose goals, signs, symptoms, and treatment of hypoglycemia and hyperglycemia, and foot care basics.   Diabetes Blitz:  -Group instruction provided by PowerPoint slides, verbal discussion, and written materials to support subject matter. The instructor gives an explanation and review of the physiology behind type 1 and type 2 diabetes, diabetes medications and rational behind using different medications, pre- and post-prandial blood glucose recommendations and Hemoglobin A1c goals, diabetes diet, and exercise including blood glucose guidelines for exercising safely.    Portion Distortion:  -Group instruction provided by PowerPoint slides, verbal discussion, written materials, and food models to support subject matter. The instructor gives an explanation of serving size versus portion size, changes in portions sizes over the last 20 years, and what consists of a serving from each food group.   CARDIAC REHAB PHASE II EXERCISE from 01/04/2017 in [facility] CARDIAC REHAB  Date  01/04/17  Educator  RD   Instruction Review Code  2- meets goals/outcomes      Stress Management:  -Group instruction provided by verbal instruction, video, and written materials to support subject matter.  Instructors review role of stress in heart disease and how to cope with stress positively.     CARDIAC REHAB PHASE II EXERCISE from 01/04/2017 in [facility] CARDIAC REHAB  Date  11/16/16  Instruction Review Code  2- meets goals/outcomes      Exercising on Your Own:  -Group instruction provided by verbal instruction, power point, and written materials to support subject.  Instructors discuss benefits of exercise, components of exercise, frequency and intensity of exercise, and end points for exercise.  Also discuss use of nitroglycerin and activating EMS.  Review options of places to exercise outside of rehab.  Review guidelines for sex with heart disease.   CARDIAC REHAB PHASE II EXERCISE from 01/04/2017 in [facility] CARDIAC REHAB  Date  11/30/16  Instruction Review Code  2- meets goals/outcomes      Cardiac Drugs I:  -Group instruction provided by verbal instruction and written materials to support subject.  Instructor reviews cardiac drug classes: antiplatelets, anticoagulants, beta blockers, and statins.  Instructor discusses reasons, side effects, and lifestyle considerations for each drug class.   Cardiac Drugs II:  -Group instruction provided by verbal instruction and written materials to support subject.  Instructor reviews cardiac drug classes: angiotensin converting enzyme inhibitors (ACE-I), angiotensin II receptor blockers (ARBs), nitrates, and calcium channel blockers.  Instructor discusses reasons, side effects, and lifestyle considerations for each drug class.   CARDIAC REHAB PHASE II EXERCISE from 01/04/2017 in [facility] CARDIAC REHAB  Date  12/28/16  Instruction Review Code  2- meets goals/outcomes      Anatomy and Physiology of the  Circulatory System:  Group verbal and written instruction and models provide basic cardiac anatomy and physiology, with the coronary electrical and arterial systems. Review of: AMI, Angina, Valve disease, Heart Failure, Peripheral Artery Disease, Cardiac Arrhythmia, Pacemakers, and the ICD.   CARDIAC REHAB PHASE [facility]  Date  12/14/16  Instruction Review Code  2- meets goals/outcomes      Other Education:  -Group or individual verbal, written, or video instructions that support the educational goals of the cardiac rehab program.   Knowledge Questionnaire Score:     Knowledge Questionnaire Score - 11/03/16 1432      Knowledge Questionnaire Score   Pre Score 21/24      Core Components/Risk Factors/Patient Goals at Admission:     Personal Goals and Risk Factors at Admission - 11/03/16 1546      Core Components/Risk Factors/Patient Goals on Admission    Weight Management Yes;Weight Maintenance;Weight Loss   Intervention Weight Management: Develop a combined nutrition and exercise program designed to reach desired caloric intake, while maintaining appropriate intake of nutrient and fiber, sodium and fats, and appropriate energy expenditure required for the weight goal.;Weight Management: Provide education and appropriate resources to help participant work on and attain dietary goals.;Weight Management/Obesity: Establish reasonable short term and long term weight goals.   Expected Outcomes Weight Maintenance: Understanding of the daily nutrition guidelines, which includes 25-35% calories from fat, 7% or less cal from saturated fats, less than 200mg  cholesterol, less than 1.5gm of sodium, & 5 or more servings of fruits and vegetables daily;Short Term: Continue to assess and modify interventions until short term weight is achieved;Long Term: Adherence to nutrition and physical activity/exercise program aimed toward attainment of  established weight goal;Weight Loss: Understanding of general recommendations for a balanced deficit meal plan, which promotes 1-2 lb weight loss per week and includes a negative energy balance of [phone] kcal/d;Understanding recommendations for meals to include 15-35% energy as protein, 25-35% energy from fat, 35-60% energy from carbohydrates, less than 200mg  of dietary cholesterol, 20-35 gm of total fiber daily;Understanding of distribution of calorie intake throughout the day with the consumption of 4-5 meals/snacks   Hypertension Yes   Intervention Provide education on lifestyle modifcations including regular physical activity/exercise, weight management, moderate sodium restriction and increased consumption of fresh fruit, vegetables, and low fat dairy, alcohol moderation, and smoking cessation.;Monitor prescription use compliance.   Expected Outcomes Long Term: Maintenance of blood pressure at goal levels.;Short Term: Continued assessment and intervention until BP is < 140/3990mm HG in hypertensive participants. < 130/7780mm HG in hypertensive participants with diabetes, heart failure or chronic kidney disease.   Lipids Yes   Intervention Provide education and support for participant on nutrition & aerobic/resistive exercise along with prescribed medications to achieve LDL 70mg , HDL >40mg .   Expected Outcomes Short Term: Participant states understanding of desired cholesterol values and is compliant with medications prescribed. Participant is following exercise prescription and nutrition guidelines.;Long Term: Cholesterol controlled with medications as prescribed, with individualized exercise RX and with personalized nutrition plan. Value goals: LDL < 70mg , HDL > 40 mg.      Core Components/Risk Factors/Patient Goals Review:    Core Components/Risk Factors/Patient Goals at Discharge (Final Review):    ITP Comments:     ITP Comments    Row Name 11/03/16 1432           ITP Comments Medical  Director, Dr. Armanda Magic          Comments: Orvella is making expected progress toward personal goals after completing 23 sessions. Recommend continued exercise and life style modification education including  stress management and relaxation techniques to decrease cardiac risk profile. Topaz continues to do well with exercise.Gladstone Lighter, RN,BSN 01/04/2017 5:05 PM

## 2017-01-06 ENCOUNTER — Encounter (HOSPITAL_COMMUNITY)
Admission: RE | Admit: 2017-01-06 | Payer: PPO | Source: Ambulatory Visit | Attending: Cardiology | Admitting: Cardiology

## 2017-01-06 ENCOUNTER — Encounter (HOSPITAL_COMMUNITY): Payer: PPO

## 2017-01-09 ENCOUNTER — Encounter (HOSPITAL_COMMUNITY): Payer: PPO

## 2017-01-09 ENCOUNTER — Encounter (HOSPITAL_COMMUNITY)
Admission: RE | Admit: 2017-01-09 | Discharge: 2017-01-09 | Disposition: A | Discharge status: Home / Self Care | Payer: PPO | Source: Ambulatory Visit | Attending: Cardiology | Admitting: Cardiology

## 2017-01-09 DIAGNOSIS — I214 Non-ST elevation (NSTEMI) myocardial infarction: Secondary | ICD-10-CM | POA: Diagnosis not present

## 2017-01-09 DIAGNOSIS — Z955 Presence of coronary angioplasty implant and graft: Secondary | ICD-10-CM

## 2017-01-11 ENCOUNTER — Encounter (HOSPITAL_COMMUNITY)
Admission: RE | Admit: 2017-01-11 | Discharge: 2017-01-11 | Disposition: A | Discharge status: Home / Self Care | Payer: PPO | Source: Ambulatory Visit | Attending: Cardiology | Admitting: Cardiology

## 2017-01-11 ENCOUNTER — Encounter (HOSPITAL_COMMUNITY): Payer: PPO

## 2017-01-11 DIAGNOSIS — Z955 Presence of coronary angioplasty implant and graft: Secondary | ICD-10-CM

## 2017-01-11 DIAGNOSIS — I214 Non-ST elevation (NSTEMI) myocardial infarction: Secondary | ICD-10-CM

## 2017-01-13 ENCOUNTER — Encounter (HOSPITAL_COMMUNITY): Payer: PPO

## 2017-01-13 ENCOUNTER — Encounter (HOSPITAL_COMMUNITY)
Admission: RE | Admit: 2017-01-13 | Discharge: 2017-01-13 | Disposition: A | Discharge status: Home / Self Care | Payer: PPO | Source: Ambulatory Visit | Attending: Cardiology | Admitting: Cardiology

## 2017-01-13 DIAGNOSIS — Z955 Presence of coronary angioplasty implant and graft: Secondary | ICD-10-CM

## 2017-01-13 DIAGNOSIS — I214 Non-ST elevation (NSTEMI) myocardial infarction: Secondary | ICD-10-CM | POA: Diagnosis not present

## 2017-01-16 ENCOUNTER — Encounter (HOSPITAL_COMMUNITY): Payer: PPO

## 2017-01-16 ENCOUNTER — Encounter (HOSPITAL_COMMUNITY)
Admission: RE | Admit: 2017-01-16 | Discharge: 2017-01-16 | Disposition: A | Discharge status: Home / Self Care | Payer: PPO | Source: Ambulatory Visit | Attending: Cardiology | Admitting: Cardiology

## 2017-01-16 DIAGNOSIS — Z7982 Long term (current) use of aspirin: Secondary | ICD-10-CM | POA: Diagnosis not present

## 2017-01-16 DIAGNOSIS — Z79899 Other long term (current) drug therapy: Secondary | ICD-10-CM | POA: Insufficient documentation

## 2017-01-16 DIAGNOSIS — Z955 Presence of coronary angioplasty implant and graft: Secondary | ICD-10-CM | POA: Diagnosis not present

## 2017-01-16 DIAGNOSIS — I2510 Atherosclerotic heart disease of native coronary artery without angina pectoris: Secondary | ICD-10-CM | POA: Insufficient documentation

## 2017-01-16 DIAGNOSIS — E7800 Pure hypercholesterolemia, unspecified: Secondary | ICD-10-CM | POA: Diagnosis not present

## 2017-01-16 DIAGNOSIS — K219 Gastro-esophageal reflux disease without esophagitis: Secondary | ICD-10-CM | POA: Insufficient documentation

## 2017-01-16 DIAGNOSIS — I10 Essential (primary) hypertension: Secondary | ICD-10-CM | POA: Diagnosis not present

## 2017-01-16 DIAGNOSIS — M1990 Unspecified osteoarthritis, unspecified site: Secondary | ICD-10-CM | POA: Insufficient documentation

## 2017-01-16 DIAGNOSIS — I214 Non-ST elevation (NSTEMI) myocardial infarction: Secondary | ICD-10-CM | POA: Diagnosis not present

## 2017-01-20 ENCOUNTER — Encounter (HOSPITAL_COMMUNITY): Payer: PPO

## 2017-01-20 ENCOUNTER — Encounter (HOSPITAL_COMMUNITY)
Admission: RE | Admit: 2017-01-20 | Discharge: 2017-01-20 | Disposition: A | Discharge status: Home / Self Care | Payer: PPO | Source: Ambulatory Visit | Attending: Cardiology | Admitting: Cardiology

## 2017-01-20 DIAGNOSIS — Z955 Presence of coronary angioplasty implant and graft: Secondary | ICD-10-CM

## 2017-01-20 DIAGNOSIS — I214 Non-ST elevation (NSTEMI) myocardial infarction: Secondary | ICD-10-CM

## 2017-01-23 ENCOUNTER — Encounter (HOSPITAL_COMMUNITY): Payer: PPO

## 2017-01-23 ENCOUNTER — Encounter (HOSPITAL_COMMUNITY)
Admission: RE | Admit: 2017-01-23 | Discharge: 2017-01-23 | Disposition: A | Discharge status: Home / Self Care | Payer: PPO | Source: Ambulatory Visit | Attending: Cardiology | Admitting: Cardiology

## 2017-01-23 DIAGNOSIS — I214 Non-ST elevation (NSTEMI) myocardial infarction: Secondary | ICD-10-CM | POA: Diagnosis not present

## 2017-01-23 DIAGNOSIS — Z955 Presence of coronary angioplasty implant and graft: Secondary | ICD-10-CM

## 2017-01-25 ENCOUNTER — Encounter (HOSPITAL_COMMUNITY): Payer: PPO

## 2017-01-25 ENCOUNTER — Encounter (HOSPITAL_COMMUNITY)
Admission: RE | Admit: 2017-01-25 | Discharge: 2017-01-25 | Disposition: A | Discharge status: Home / Self Care | Payer: PPO | Source: Ambulatory Visit | Attending: Cardiology | Admitting: Cardiology

## 2017-01-25 DIAGNOSIS — Z955 Presence of coronary angioplasty implant and graft: Secondary | ICD-10-CM

## 2017-01-25 DIAGNOSIS — I214 Non-ST elevation (NSTEMI) myocardial infarction: Secondary | ICD-10-CM

## 2017-01-27 ENCOUNTER — Encounter (HOSPITAL_COMMUNITY): Payer: PPO

## 2017-01-27 ENCOUNTER — Encounter (HOSPITAL_COMMUNITY)
Admission: RE | Admit: 2017-01-27 | Discharge: 2017-01-27 | Disposition: A | Discharge status: Home / Self Care | Payer: PPO | Source: Ambulatory Visit | Attending: Cardiology | Admitting: Cardiology

## 2017-01-27 DIAGNOSIS — I214 Non-ST elevation (NSTEMI) myocardial infarction: Secondary | ICD-10-CM | POA: Diagnosis not present

## 2017-01-27 DIAGNOSIS — Z955 Presence of coronary angioplasty implant and graft: Secondary | ICD-10-CM

## 2017-01-30 ENCOUNTER — Encounter (HOSPITAL_COMMUNITY)
Admission: RE | Admit: 2017-01-30 | Discharge: 2017-01-30 | Disposition: A | Discharge status: Home / Self Care | Payer: PPO | Source: Ambulatory Visit | Attending: Cardiology | Admitting: Cardiology

## 2017-01-30 ENCOUNTER — Encounter (HOSPITAL_COMMUNITY): Payer: PPO

## 2017-01-30 DIAGNOSIS — Z955 Presence of coronary angioplasty implant and graft: Secondary | ICD-10-CM

## 2017-01-30 DIAGNOSIS — I214 Non-ST elevation (NSTEMI) myocardial infarction: Secondary | ICD-10-CM

## 2017-01-31 NOTE — Progress Notes (Signed)
Cardiac Individual Treatment Plan  Patient Details  Name: [name] [name] MRN: [id number] Date of Birth: 1945-12-15 Referring Provider:     CARDIAC REHAB PHASE II ORIENTATION from 11/03/2016 in [facility] CARDIAC REHAB  Referring Provider  Rinaldo Cloud MD      Initial Encounter Date:    CARDIAC REHAB PHASE II ORIENTATION from 11/03/2016 in [facility] CARDIAC REHAB  Date  11/03/16  Referring Provider  Rinaldo Cloud MD      Visit Diagnosis: Acute non Q wave MI (myocardial infarction), initial episode of care [facility])  09/15/16 Status post coronary artery stent placement DES ost LAD  Patient's Home Medications on Admission:  Current Outpatient Prescriptions:  .  acetaminophen (TYLENOL) 500 MG tablet, Take 500 mg by mouth every 6 (six) hours as needed for mild pain., Disp: , Rfl:  .  albuterol (PROVENTIL HFA;VENTOLIN HFA) 108 (90 Base) MCG/ACT inhaler, Inhale 2 puffs into the lungs 4 (four) times daily as needed., Disp: , Rfl:  .  aspirin 81 MG chewable tablet, Chew 81 mg by mouth daily., Disp: , Rfl:  .  atorvastatin (LIPITOR) 20 MG tablet, Take 10 mg by mouth daily. , Disp: , Rfl:  .  baclofen (LIORESAL) 10 MG tablet, Take 10 mg by mouth daily., Disp: , Rfl:  .  docusate sodium (COLACE) 100 MG capsule, Take 100 mg by mouth 2 (two) times daily., Disp: , Rfl:  .  fexofenadine (ALLEGRA) 180 MG tablet, Take 180 mg by mouth daily., Disp: , Rfl:  .  lisinopril (PRINIVIL,ZESTRIL) 20 MG tablet, Take 20 mg by mouth daily., Disp: , Rfl:  .  metoprolol tartrate (LOPRESSOR) 25 MG tablet, Take 0.5 tablets (12.5 mg total) by mouth 2 (two) times daily., Disp: 30 tablet, Rfl: 3 .  Multiple Vitamin (MULTIVITAMIN WITH MINERALS) TABS tablet, Take 1 tablet by mouth daily. centrum silver, Disp: , Rfl:  .  nitroGLYCERIN (NITROSTAT) 0.4 MG SL tablet, Place 1 tablet under the tongue every 5 (five) minutes x 3 doses as needed., Disp: , Rfl:  .  nortriptyline (PAMELOR) 25 MG  capsule, Take 50 mg by mouth daily., Disp: , Rfl:  .  Polyethyl Glycol-Propyl Glycol (SYSTANE OP), Place 1 drop into both eyes daily as needed. For dry eyes, Disp: , Rfl:  .  ticagrelor (BRILINTA) 90 MG TABS tablet, Take 1 tablet (90 mg total) by mouth 2 (two) times daily., Disp: 180 tablet, Rfl: 4  Past Medical History: Past Medical History:  Diagnosis Date  . Arthritis    "knees, hips, hands, back" (09/15/2016)  . Constipation   . Coronary artery disease   . Exercise-induced asthma   . Family history of adverse reaction to anesthesia    "daughter gets PONV too"  . GERD (gastroesophageal reflux disease)   . Heart murmur dx'd 09/14/2016  . High cholesterol   . Hypertension   . Migraine    "recently had series of injections; none since; they had been weekly 1 month ago" (09/15/2016)  . Necrotizing fasciitis (HCC)    R index finger- had a amp  . NSTEMI (non-ST elevated myocardial infarction) (HCC)   . Pneumonia ~ 2003  . PONV (postoperative nausea and vomiting)    Patch helps  . RSD (reflex sympathetic dystrophy)    Right arm after surgery    Tobacco Use: History  Smoking Status  . Never Smoker  Smokeless Tobacco  . Never Used    Labs: Recent Review [employer]  for [facility] Cardiac and Pulmonary Rehab Latest Ref Rng & Units 09/15/2016   Cholestrol 0 - 200 mg/dL 161(W)   LDLCALC 0 - 99 mg/dL 960(A)   HDL >54 mg/dL 48   Trlycerides <098 mg/dL 119      Capillary Blood Glucose: No results found for: GLUCAP   Exercise Target Goals:    Exercise Program Goal: Individual exercise prescription set with THRR, safety & activity barriers. Participant demonstrates ability to understand and report RPE using BORG scale, to self-measure pulse accurately, and to acknowledge the importance of the exercise prescription.  Exercise Prescription Goal: Starting with aerobic activity 30 plus minutes a day, 3 days per week for initial exercise prescription. Provide home exercise  prescription and guidelines that participant acknowledges understanding prior to discharge.  Activity Barriers & Risk Stratification:     Activity Barriers & Cardiac Risk Stratification - 11/03/16 1456      Activity Barriers & Cardiac Risk Stratification   Activity Barriers Back Problems;Muscular Weakness;Deconditioning;Other (comment)   Comments R>L knee pain, back surgery in 2014   Cardiac Risk Stratification High      6 Minute Walk:     6 Minute Walk    Row Name 11/03/16 1434 11/03/16 1535       6 Minute Walk   Phase Initial  -    Distance 1400 feet  -    Walk Time 6 minutes  -    # of Rest Breaks 0  -    MPH 2.65  -    METS 3.1  -    RPE 12  -    VO2 Peak 10.8  -    Symptoms Yes (comment)  -    Comments c/o fatigue at end of walk test  -    Resting HR 73 bpm  -    Resting BP 100/70  -    Max Ex. HR 100 bpm  -    Max Ex. BP 138/80  -    2 Minute Post BP  - 132/78       Oxygen Initial Assessment:   Oxygen Re-Evaluation:   Oxygen Discharge (Final Oxygen Re-Evaluation):   Initial Exercise Prescription:     Initial Exercise Prescription - 11/03/16 1500      Date of Initial Exercise RX and Referring Provider   Date 11/03/16   Referring Provider Rinaldo Cloud MD     Treadmill   MPH 2.2   Grade 1   Minutes 10   METs 2.99     Recumbant Bike   Level 1.5   Minutes 10   METs 2     NuStep   Level 3   SPM 70   Minutes 10   METs 2     Prescription Details   Frequency (times per week) 3   Duration Progress to 30 minutes of continuous aerobic without signs/symptoms of physical distress     Intensity   THRR 40-80% of Max Heartrate 60-119   Ratings of Perceived Exertion 11-13   Perceived Dyspnea 0-4     Progression   Progression Continue to progress workloads to maintain intensity without signs/symptoms of physical distress.     Resistance Training   Training Prescription Yes   Weight 2lbs   Reps 10-15      Perform Capillary Blood  Glucose checks as needed.  Exercise Prescription Changes:      Exercise Prescription Changes    Row Name 11/08/16 1600 11/24/16 1600 01/04/17 1400 01/30/17 1100  Response to Exercise   Blood Pressure (Admit) 131/70 110/78 129/72 128/70    Blood Pressure (Exercise) 132/80 118/68 138/70 144/70    Blood Pressure (Exit) 128/70 110/78 117/75 118/82    Heart Rate (Admit) 74 bpm 64 bpm 84 bpm 76 bpm    Heart Rate (Exercise) 101 bpm 93 bpm 118 bpm 98 bpm    Heart Rate (Exit) 71 bpm 64 bpm 78 bpm 80 bpm    Rating of Perceived Exertion (Exercise) 13 10 11 13     Comments Pt completed first exercise session on 11/07/16 without difficulty Pt completed first exercise session on 11/07/16 without difficulty Pt completed first exercise session on 11/07/16 without difficulty Pt completed first exercise session on 11/07/16 without difficulty    Duration Continue with 30 min of aerobic exercise without signs/symptoms of physical distress. Continue with 30 min of aerobic exercise without signs/symptoms of physical distress. Continue with 30 min of aerobic exercise without signs/symptoms of physical distress. Continue with 30 min of aerobic exercise without signs/symptoms of physical distress.    Intensity THRR unchanged THRR unchanged THRR unchanged THRR unchanged      Progression   Progression  - Continue to progress workloads to maintain intensity without signs/symptoms of physical distress. Continue to progress workloads to maintain intensity without signs/symptoms of physical distress. Continue to progress workloads to maintain intensity without signs/symptoms of physical distress.    Average METs  - 2.6 3.1 3.1      Resistance Training   Training Prescription Yes Yes Yes Yes    Weight 2lbs 2lbs 3lb 4lb    Reps 10-15 10-15 10-15 10-15    Time 10 Minutes 10 Minutes 10 Minutes 10 Minutes      Treadmill   MPH 2.2 2.2 2.2 2.2    Grade 1 1 1 1     Minutes 10 10 10 10     METs 2.99 2.99 2.99 2.99       Recumbant Bike   Level 1.5 3 3 3     Minutes 10 10 10 10     METs 2 1.9 3.4 3.4      NuStep   Level 3 3 4 4     SPM 70 70 80 80    Minutes 10 10 10 10     METs 1.9 2.9 2.9 3.2      Home Exercise Plan   Plans to continue exercise at  - Home (comment) Home (comment) Home (comment)    Frequency  - Add 3 additional days to program exercise sessions. Add 3 additional days to program exercise sessions. Add 3 additional days to program exercise sessions.    Initial Home Exercises Provided  - 11/23/16 11/23/16 11/23/16       Exercise Comments:      Exercise Comments    Row Name 11/08/16 1629 11/30/16 1707 01/04/17 1415 01/30/17 1149     Exercise Comments Pt responded well to exercise on 11/07/16. Pt was oriented to exercise equipment and exercise for 30 minute without difficulty Reviewed METs and goals with pt.  Pt is doing well with exercise.  Reviewed METs and goals with pt. Reviewed METs and goals with pt.       Exercise Goals and Review:      Exercise Goals    Row Name 11/03/16 1456             Exercise Goals   Increase Physical Activity Yes       Intervention Provide advice, education, support and counseling about physical activity/exercise  needs.;Develop an individualized exercise prescription for aerobic and resistive training based on initial evaluation findings, risk stratification, comorbidities and participant's personal goals.       Expected Outcomes Achievement of increased cardiorespiratory fitness and enhanced flexibility, muscular endurance and strength shown through measurements of functional capacity and personal statement of participant.       Increase Strength and Stamina Yes       Intervention Provide advice, education, support and counseling about physical activity/exercise needs.;Develop an individualized exercise prescription for aerobic and resistive training based on initial evaluation findings, risk stratification, comorbidities and participant's personal  goals.       Expected Outcomes Achievement of increased cardiorespiratory fitness and enhanced flexibility, muscular endurance and strength shown through measurements of functional capacity and personal statement of participant.          Exercise Goals Re-Evaluation :     Exercise Goals Re-Evaluation    Row Name 11/30/16 1705 01/04/17 1414 01/30/17 1149         Exercise Goal Re-Evaluation   Exercise Goals Review Increase Physical Activity;Increase Strenth and Stamina Increase Physical Activity;Increase Strenth and Stamina Increase Physical Activity;Increase Strenth and Stamina     Comments Pt states that exercising feels easier for her than it did when she first started and she feels like she is stronger Pt continues to do well with exercise and she is walking at home on her off days from CR Pt continues to do well with exercise and she is walking at home on her off days from CR     Expected Outcomes continue with exercise Rx in order to continue to increase cardiorespiratory fitness. continue with exercise Rx in order to continue to increase cardiorespiratory fitness. continue with exercise Rx in order to continue to increase cardiorespiratory fitness.         Discharge Exercise Prescription (Final Exercise Prescription Changes):     Exercise Prescription Changes - 01/30/17 1100      Response to Exercise   Blood Pressure (Admit) 128/70   Blood Pressure (Exercise) 144/70   Blood Pressure (Exit) 118/82   Heart Rate (Admit) 76 bpm   Heart Rate (Exercise) 98 bpm   Heart Rate (Exit) 80 bpm   Rating of Perceived Exertion (Exercise) 13   Comments Pt completed first exercise session on 11/07/16 without difficulty   Duration Continue with 30 min of aerobic exercise without signs/symptoms of physical distress.   Intensity THRR unchanged     Progression   Progression Continue to progress workloads to maintain intensity without signs/symptoms of physical distress.   Average METs 3.1      Resistance Training   Training Prescription Yes   Weight 4lb   Reps 10-15   Time 10 Minutes     Treadmill   MPH 2.2   Grade 1   Minutes 10   METs 2.99     Recumbant Bike   Level 3   Minutes 10   METs 3.4     NuStep   Level 4   SPM 80   Minutes 10   METs 3.2     Home Exercise Plan   Plans to continue exercise at Home (comment)   Frequency Add 3 additional days to program exercise sessions.   Initial Home Exercises Provided 11/23/16      Nutrition:  Target Goals: Understanding of nutrition guidelines, daily intake of sodium 1500mg , cholesterol 200mg , calories 30% from fat and 7% or less from saturated fats, daily to have 5 or more servings  of fruits and vegetables.  Biometrics:     Pre Biometrics - 11/03/16 1553      Pre Biometrics   Weight 161 lb 6 oz (73.2 kg)       Nutrition Therapy Plan and Nutrition Goals:     Nutrition Therapy & Goals - 11/03/16 1419      Nutrition Therapy   Diet Therapeutic Lifestyle Changes     Intervention Plan   Intervention Prescribe, educate and counsel regarding individualized specific dietary modifications aiming towards targeted core components such as weight, hypertension, lipid management, diabetes, heart failure and other comorbidities.;Nutrition handout(s) given to patient.  Constipation Nutrition Therapy   Expected Outcomes Short Term Goal: Understand basic principles of dietary content, such as calories, fat, sodium, cholesterol and nutrients.;Long Term Goal: Adherence to prescribed nutrition plan.      Nutrition Discharge: Nutrition Scores:   Nutrition Goals Re-Evaluation:   Nutrition Goals Re-Evaluation:   Nutrition Goals Discharge (Final Nutrition Goals Re-Evaluation):   Psychosocial: Target Goals: Acknowledge presence or absence of significant depression and/or stress, maximize coping skills, provide positive support system. Participant is able to verbalize types and ability to use techniques and skills  needed for reducing stress and depression.  Initial Review & Psychosocial Screening:     Initial Psych Review & Screening - 11/03/16 1627      Initial Review   Current issues with None Identified     Family Dynamics   Good Support System? Yes     Barriers   Psychosocial barriers to participate in program There are no identifiable barriers or psychosocial needs.     Screening Interventions   Interventions Encouraged to exercise      Quality of Life Scores:     Quality of Life - 11/11/16 1605      Quality of Life Scores   Health/Function Pre 26.14 %  overall quality of life scores excellent.  pt only mild concern is her recent cardiac event.  pt is encouraged that she is able to participate in CR activities. pt is seeking to establish care with [facility].  pt is awaiting scheduling phone call.     Socioeconomic Pre 26.57 %   Psych/Spiritual Pre 29 %   Family Pre 30 %   GLOBAL Pre 27.38 %      PHQ-9: Recent Review Flowsheet Data    Depression screen [facility] 2/9 11/07/2016   Decreased Interest 0   Down, Depressed, Hopeless 0   PHQ - 2 Score 0     Interpretation of Total Score  Total Score Depression Severity:  1-4 = Minimal depression, 5-9 = Mild depression, 10-14 = Moderate depression, 15-19 = Moderately severe depression, 20-27 = Severe depression   Psychosocial Evaluation and Intervention:     Psychosocial Evaluation - 11/07/16 1621      Psychosocial Evaluation & Interventions   Interventions Encouraged to exercise with the program and follow exercise prescription   Comments no psychosocial needs identified, no interventions necessary    Expected Outcomes pt will exhibit positive outlook with good coping skills.    Continue Psychosocial Services  No Follow up required      Psychosocial Re-Evaluation:     Psychosocial Re-Evaluation    Row Name 12/06/16 1226 01/04/17 1702 01/31/17 1518         Psychosocial Re-Evaluation   Current issues with None  Identified None Identified None Identified     Interventions Encouraged to attend Cardiac Rehabilitation for the exercise Encouraged to attend Cardiac Rehabilitation for the exercise Encouraged  to attend Cardiac Rehabilitation for the exercise     Continue Psychosocial Services  No Follow up required No Follow up required No Follow up required        Psychosocial Discharge (Final Psychosocial Re-Evaluation):     Psychosocial Re-Evaluation - 01/31/17 1518      Psychosocial Re-Evaluation   Current issues with None Identified   Interventions Encouraged to attend Cardiac Rehabilitation for the exercise   Continue Psychosocial Services  No Follow up required      Vocational Rehabilitation: Provide vocational rehab assistance to qualifying candidates.   Vocational Rehab Evaluation & Intervention:     Vocational Rehab - 11/07/16 1620      Initial Vocational Rehab Evaluation & Intervention   Assessment shows need for Vocational Rehabilitation No      Education: Education Goals: Education classes will be provided on a weekly basis, covering required topics. Participant will state understanding/return demonstration of topics presented.  Learning Barriers/Preferences:     Learning Barriers/Preferences - 11/03/16 1455      Learning Barriers/Preferences   Learning Barriers Sight   Learning Preferences Written Material;Video;Pictoral;Skilled Demonstration      Education Topics: Count Your Pulse:  -Group instruction provided by verbal instruction, demonstration, patient participation and written materials to support subject.  Instructors address importance of being able to find your pulse and how to count your pulse when at home without a heart monitor.  Patients get hands on experience counting their pulse with staff help and individually.   CARDIAC REHAB PHASE II EXERCISE from 01/25/2017 in [facility] CARDIAC REHAB  Date  12/16/16  Instruction Review Code  2-  meets goals/outcomes      Heart Attack, Angina, and Risk Factor Modification:  -Group instruction provided by verbal instruction, video, and written materials to support subject.  Instructors address signs and symptoms of angina and heart attacks.    Also discuss risk factors for heart disease and how to make changes to improve heart health risk factors.   Functional Fitness:  -Group instruction provided by verbal instruction, demonstration, patient participation, and written materials to support subject.  Instructors address safety measures for doing things around the house.  Discuss how to get up and down off the floor, how to pick things up properly, how to safely get out of a chair without assistance, and balance training.   Meditation and Mindfulness:  -Group instruction provided by verbal instruction, patient participation, and written materials to support subject.  Instructor addresses importance of mindfulness and meditation practice to help reduce stress and improve awareness.  Instructor also leads participants through a meditation exercise.    Stretching for Flexibility and Mobility:  -Group instruction provided by verbal instruction, patient participation, and written materials to support subject.  Instructors lead participants through series of stretches that are designed to increase flexibility thus improving mobility.  These stretches are additional exercise for major muscle groups that are typically performed during regular warm up and cool down.   CARDIAC REHAB PHASE II EXERCISE from 01/25/2017 in [facility] CARDIAC REHAB  Date  11/11/16  Instruction Review Code  2- meets goals/outcomes      Hands Only CPR:  -Group verbal, video, and participation provides a basic overview of AHA guidelines for community CPR. Role-play of emergencies allow participants the opportunity to practice calling for help and chest compression technique with discussion of AED  use.   Hypertension: -Group verbal and written instruction that provides a basic overview of hypertension including  the most recent diagnostic guidelines, risk factor reduction with self-care instructions and medication management.    Nutrition I class: Heart Healthy Eating:  -Group instruction provided by PowerPoint slides, verbal discussion, and written materials to support subject matter. The instructor gives an explanation and review of the Therapeutic Lifestyle Changes diet recommendations, which includes a discussion on lipid goals, dietary fat, sodium, fiber, plant stanol/sterol esters, sugar, and the components of a well-balanced, healthy diet.   CARDIAC REHAB PHASE II EXERCISE from 01/25/2017 in [facility] CARDIAC REHAB  Date  11/03/16  Educator  RD  Instruction Review Code  Not applicable [class handouts given]      Nutrition II class: Lifestyle Skills:  -Group instruction provided by PowerPoint slides, verbal discussion, and written materials to support subject matter. The instructor gives an explanation and review of label reading, grocery shopping for heart health, heart healthy recipe modifications, and ways to make healthier choices when eating out.   CARDIAC REHAB PHASE II EXERCISE from 01/25/2017 in [facility] CARDIAC REHAB  Date  11/03/16  Educator  RD  Instruction Review Code  Not applicable [class handouts given]      Diabetes Question & Answer:  -Group instruction provided by PowerPoint slides, verbal discussion, and written materials to support subject matter. The instructor gives an explanation and review of diabetes co-morbidities, pre- and post-prandial blood glucose goals, pre-exercise blood glucose goals, signs, symptoms, and treatment of hypoglycemia and hyperglycemia, and foot care basics.   Diabetes Blitz:  -Group instruction provided by PowerPoint slides, verbal discussion, and written materials to support subject matter.  The instructor gives an explanation and review of the physiology behind type 1 and type 2 diabetes, diabetes medications and rational behind using different medications, pre- and post-prandial blood glucose recommendations and Hemoglobin A1c goals, diabetes diet, and exercise including blood glucose guidelines for exercising safely.    Portion Distortion:  -Group instruction provided by PowerPoint slides, verbal discussion, written materials, and food models to support subject matter. The instructor gives an explanation of serving size versus portion size, changes in portions sizes over the last 20 years, and what consists of a serving from each food group.   CARDIAC REHAB PHASE II EXERCISE from 01/25/2017 in [facility] CARDIAC REHAB  Date  01/04/17  Educator  RD  Instruction Review Code  2- meets goals/outcomes      Stress Management:  -Group instruction provided by verbal instruction, video, and written materials to support subject matter.  Instructors review role of stress in heart disease and how to cope with stress positively.     CARDIAC REHAB PHASE II EXERCISE from 01/25/2017 in [facility] CARDIAC REHAB  Date  01/11/17  Instruction Review Code  2- meets goals/outcomes      Exercising on Your Own:  -Group instruction provided by verbal instruction, power point, and written materials to support subject.  Instructors discuss benefits of exercise, components of exercise, frequency and intensity of exercise, and end points for exercise.  Also discuss use of nitroglycerin and activating EMS.  Review options of places to exercise outside of rehab.  Review guidelines for sex with heart disease.   CARDIAC REHAB PHASE II EXERCISE from 01/25/2017 in [facility] CARDIAC REHAB  Date  11/30/16  Instruction Review Code  2- meets goals/outcomes      Cardiac Drugs I:  -Group instruction provided by verbal instruction and written materials to  support subject.  Instructor reviews cardiac drug  classes: antiplatelets, anticoagulants, beta blockers, and statins.  Instructor discusses reasons, side effects, and lifestyle considerations for each drug class.   Cardiac Drugs II:  -Group instruction provided by verbal instruction and written materials to support subject.  Instructor reviews cardiac drug classes: angiotensin converting enzyme inhibitors (ACE-I), angiotensin II receptor blockers (ARBs), nitrates, and calcium channel blockers.  Instructor discusses reasons, side effects, and lifestyle considerations for each drug class.   CARDIAC REHAB PHASE II EXERCISE from 01/25/2017 in [facility] CARDIAC REHAB  Date  01/25/17  Instruction Review Code  2- meets goals/outcomes      Anatomy and Physiology of the Circulatory System:  Group verbal and written instruction and models provide basic cardiac anatomy and physiology, with the coronary electrical and arterial systems. Review of: AMI, Angina, Valve disease, Heart Failure, Peripheral Artery Disease, Cardiac Arrhythmia, Pacemakers, and the ICD.   CARDIAC REHAB PHASE II EXERCISE from 01/25/2017 in [facility] CARDIAC REHAB  Date  12/14/16  Instruction Review Code  2- meets goals/outcomes      Other Education:  -Group or individual verbal, written, or video instructions that support the educational goals of the cardiac rehab program.   Knowledge Questionnaire Score:     Knowledge Questionnaire Score - 11/03/16 1432      Knowledge Questionnaire Score   Pre Score 21/24      Core Components/Risk Factors/Patient Goals at Admission:     Personal Goals and Risk Factors at Admission - 11/03/16 1546      Core Components/Risk Factors/Patient Goals on Admission    Weight Management Yes;Weight Maintenance;Weight Loss   Intervention Weight Management: Develop a combined nutrition and exercise program designed to reach desired caloric intake, while  maintaining appropriate intake of nutrient and fiber, sodium and fats, and appropriate energy expenditure required for the weight goal.;Weight Management: Provide education and appropriate resources to help participant work on and attain dietary goals.;Weight Management/Obesity: Establish reasonable short term and long term weight goals.   Expected Outcomes Weight Maintenance: Understanding of the daily nutrition guidelines, which includes 25-35% calories from fat, 7% or less cal from saturated fats, less than 200mg  cholesterol, less than 1.5gm of sodium, & 5 or more servings of fruits and vegetables daily;Short Term: Continue to assess and modify interventions until short term weight is achieved;Long Term: Adherence to nutrition and physical activity/exercise program aimed toward attainment of established weight goal;Weight Loss: Understanding of general recommendations for a balanced deficit meal plan, which promotes 1-2 lb weight loss per week and includes a negative energy balance of [phone] kcal/d;Understanding recommendations for meals to include 15-35% energy as protein, 25-35% energy from fat, 35-60% energy from carbohydrates, less than 200mg  of dietary cholesterol, 20-35 gm of total fiber daily;Understanding of distribution of calorie intake throughout the day with the consumption of 4-5 meals/snacks   Hypertension Yes   Intervention Provide education on lifestyle modifcations including regular physical activity/exercise, weight management, moderate sodium restriction and increased consumption of fresh fruit, vegetables, and low fat dairy, alcohol moderation, and smoking cessation.;Monitor prescription use compliance.   Expected Outcomes Long Term: Maintenance of blood pressure at goal levels.;Short Term: Continued assessment and intervention until BP is < 140/73mm HG in hypertensive participants. < 130/83mm HG in hypertensive participants with diabetes, heart failure or chronic kidney disease.    Lipids Yes   Intervention Provide education and support for participant on nutrition & aerobic/resistive exercise along with prescribed medications to achieve LDL 70mg , HDL >40mg .   Expected Outcomes Short Term: Participant  states understanding of desired cholesterol values and is compliant with medications prescribed. Participant is following exercise prescription and nutrition guidelines.;Long Term: Cholesterol controlled with medications as prescribed, with individualized exercise RX and with personalized nutrition plan. Value goals: LDL < 70mg , HDL > 40 mg.      Core Components/Risk Factors/Patient Goals Review:    Core Components/Risk Factors/Patient Goals at Discharge (Final Review):    ITP Comments:     ITP Comments    Row Name 11/03/16 1432           ITP Comments Medical Director, Dr. Armanda Magic          Comments: Priscille Kluver is making expected progress toward personal goals after completing 30 sessions. Recommend continued exercise and life style modification education including  stress management and relaxation techniques to decrease cardiac risk profile. Elowyn continues to do well with exercise at cardiac rehab.Gladstone Lighter, RN,BSN 01/31/2017 3:21 PM

## 2017-02-01 ENCOUNTER — Encounter (HOSPITAL_COMMUNITY)
Admission: RE | Admit: 2017-02-01 | Discharge: 2017-02-01 | Disposition: A | Discharge status: Home / Self Care | Payer: PPO | Source: Ambulatory Visit | Attending: Cardiology | Admitting: Cardiology

## 2017-02-01 ENCOUNTER — Encounter (HOSPITAL_COMMUNITY): Payer: PPO

## 2017-02-01 DIAGNOSIS — I214 Non-ST elevation (NSTEMI) myocardial infarction: Secondary | ICD-10-CM

## 2017-02-01 DIAGNOSIS — Z955 Presence of coronary angioplasty implant and graft: Secondary | ICD-10-CM

## 2017-02-01 NOTE — Progress Notes (Signed)
Discharge Summary  Patient Details  Name: [name] [name] MRN: [id number] Date of Birth: 1946-03-20 Referring Provider:     CARDIAC REHAB PHASE II ORIENTATION from 11/03/2016 in [place]  Referring Provider  Charolette Forward MD       Number of Visits: 36  Reason for Discharge:  Patient independent in their exercise.  Smoking History:  History  Smoking Status  . Never Smoker  Smokeless Tobacco  . Never Used    Diagnosis:  Acute non Q wave MI (myocardial infarction), initial episode of care ([place])  09/15/16 Status post coronary artery stent placement DES ost LAD  ADL UCSD:   Initial Exercise Prescription:     Initial Exercise Prescription - 11/03/16 1500      Date of Initial Exercise RX and Referring Provider   Date 11/03/16   Referring Provider Charolette Forward MD     Treadmill   MPH 2.2   Grade 1   Minutes 10   METs 2.99     Recumbant Bike   Level 1.5   Minutes 10   METs 2     NuStep   Level 3   SPM 70   Minutes 10   METs 2     Prescription Details   Frequency (times per week) 3   Duration Progress to 30 minutes of continuous aerobic without signs/symptoms of physical distress     Intensity   THRR 40-80% of Max Heartrate 60-119   Ratings of Perceived Exertion 11-13   Perceived Dyspnea 0-4     Progression   Progression Continue to progress workloads to maintain intensity without signs/symptoms of physical distress.     Resistance Training   Training Prescription Yes   Weight 2lbs   Reps 10-15      Discharge Exercise Prescription (Final Exercise Prescription Changes):     Exercise Prescription Changes - 02/22/17 1400      Response to Exercise   Blood Pressure (Admit) 112/70   Blood Pressure (Exercise) 134/72   Blood Pressure (Exit) 122/70   Heart Rate (Admit) 73 bpm   Heart Rate (Exercise) 100 bpm   Heart Rate (Exit) 72 bpm   Rating of Perceived Exertion (Exercise) 12   Comments Pt completed first  exercise session on 11/07/16 without difficulty   Duration Continue with 30 min of aerobic exercise without signs/symptoms of physical distress.   Intensity THRR unchanged     Progression   Progression Continue to progress workloads to maintain intensity without signs/symptoms of physical distress.   Average METs 3.3     Resistance Training   Training Prescription Yes   Weight 4lb   Reps 10-15   Time 10 Minutes     Treadmill   MPH 2.2   Grade 1   Minutes 10   METs 2.99     Recumbant Bike   Level 3   Minutes 10   METs 3.4     NuStep   Level 4   SPM 80   Minutes 10   METs 3.2     Home Exercise Plan   Plans to continue exercise at Home (comment)   Frequency Add 3 additional days to program exercise sessions.   Initial Home Exercises Provided 11/23/16      Functional Capacity:     6 Minute Walk    Row Name 11/03/16 1434 11/03/16 1535 02/22/17 1404     6 Minute Walk   Phase Initial  - Discharge   Distance 1400  feet  - 1785 feet   Distance % Change  -  - 27.5 %   Walk Time 6 minutes  - 6 minutes   # of Rest Breaks 0  - 0   MPH 2.65  - 3.4   METS 3.1  - 4.1   RPE 12  - 12   VO2 Peak 10.8  - 14.1   Symptoms Yes (comment)  - No   Comments c/o fatigue at end of walk test  -  -   Resting HR 73 bpm  - 69 bpm   Resting BP 100/70  - 102/78   Max Ex. HR 100 bpm  - 111 bpm   Max Ex. BP 138/80  - 160/80   2 Minute Post BP  - 132/78 104/72      Psychological, QOL, Others - Outcomes: PHQ 2/9: Depression screen [facility] 2/9 02/01/2017 11/07/2016  Decreased Interest 0 0  Down, Depressed, Hopeless 0 0  PHQ - 2 Score 0 0    Quality of Life:     Quality of Life - 11/11/16 1605      Quality of Life Scores   Health/Function Pre 26.14 %  overall quality of life scores excellent.  pt only mild concern is her recent cardiac event.  pt is encouraged that she is able to participate in CR activities. pt is seeking to establish care with [facility].  pt is awaiting  scheduling phone call.     Socioeconomic Pre 26.57 %   Psych/Spiritual Pre 29 %   Family Pre 30 %   GLOBAL Pre 27.38 %      Personal Goals: Goals established at orientation with interventions provided to work toward goal.     Personal Goals and Risk Factors at Admission - 11/03/16 1546      Core Components/Risk Factors/Patient Goals on Admission    Weight Management Yes;Weight Maintenance;Weight Loss   Intervention Weight Management: Develop a combined nutrition and exercise program designed to reach desired caloric intake, while maintaining appropriate intake of nutrient and fiber, sodium and fats, and appropriate energy expenditure required for the weight goal.;Weight Management: Provide education and appropriate resources to help participant work on and attain dietary goals.;Weight Management/Obesity: Establish reasonable short term and long term weight goals.   Expected Outcomes Weight Maintenance: Understanding of the daily nutrition guidelines, which includes 25-35% calories from fat, 7% or less cal from saturated fats, less than '200mg'$  cholesterol, less than 1.5gm of sodium, & 5 or more servings of fruits and vegetables daily;Short Term: Continue to assess and modify interventions until short term weight is achieved;Long Term: Adherence to nutrition and physical activity/exercise program aimed toward attainment of established weight goal;Weight Loss: Understanding of general recommendations for a balanced deficit meal plan, which promotes 1-2 lb weight loss per week and includes a negative energy balance of [phone] kcal/d;Understanding recommendations for meals to include 15-35% energy as protein, 25-35% energy from fat, 35-60% energy from carbohydrates, less than '200mg'$  of dietary cholesterol, 20-35 gm of total fiber daily;Understanding of distribution of calorie intake throughout the day with the consumption of 4-5 meals/snacks   Hypertension Yes   Intervention Provide education on  lifestyle modifcations including regular physical activity/exercise, weight management, moderate sodium restriction and increased consumption of fresh fruit, vegetables, and low fat dairy, alcohol moderation, and smoking cessation.;Monitor prescription use compliance.   Expected Outcomes Long Term: Maintenance of blood pressure at goal levels.;Short Term: Continued assessment and intervention until BP is < 140/44m HG in hypertensive participants. <  130/82m HG in hypertensive participants with diabetes, heart failure or chronic kidney disease.   Lipids Yes   Intervention Provide education and support for participant on nutrition & aerobic/resistive exercise along with prescribed medications to achieve LDL '70mg'$ , HDL >'40mg'$ .   Expected Outcomes Short Term: Participant states understanding of desired cholesterol values and is compliant with medications prescribed. Participant is following exercise prescription and nutrition guidelines.;Long Term: Cholesterol controlled with medications as prescribed, with individualized exercise RX and with personalized nutrition plan. Value goals: LDL < '70mg'$ , HDL > 40 mg.       Personal Goals Discharge:   Nutrition & Weight - Outcomes:     Pre Biometrics - 11/03/16 1553      Pre Biometrics   Weight 161 lb 6 oz (73.2 kg)         Post Biometrics - 02/22/17 1409       Post  Biometrics   Weight 152 lb 5.4 oz (69.1 kg)   Waist Circumference 35 inches   Hip Circumference 40 inches   Waist to Hip Ratio 0.88 %   Triceps Skinfold 28 mm   % Body Fat 37.8 %   Grip Strength 31 kg   Flexibility 0 in   Single Leg Stand 30 seconds      Nutrition:     Nutrition Therapy & Goals - 02/01/17 1559      Nutrition Therapy   Diet Therapeutic Lifestyle Changes     Personal Nutrition Goals   Nutrition Goal Wt loss of 1-2 lb/week to a wt loss goal of 6-10 lb [place] educate and counsel  regarding individualized specific dietary modifications aiming towards targeted core components such as weight, hypertension, lipid management, diabetes, heart failure and other comorbidities.;Nutrition handout(s) given to patient.  Constipation Nutrition Therapy   Expected Outcomes Short Term Goal: Understand basic principles of dietary content, such as calories, fat, sodium, cholesterol and nutrients.;Long Term Goal: Adherence to prescribed nutrition plan.      Nutrition Discharge:     Nutrition Assessments - 02/01/17 1558      MEDFICTS Scores   Pre Score 9      Education Questionnaire Score:     Knowledge Questionnaire Score - 11/03/16 1432      Knowledge Questionnaire Score   Pre Score 21/24      Goals reviewed with patient; copy given to patient.JHavengraduates from cardiac rehab program on Wednesday with completion of 36 exercise sessions in Phase II. Pt maintained good attendance and progressed nicely during his participation in rehab as evidenced by increased MET level.   Medication list reconciled. Repeat  PHQ score-0.  Pt has made significant lifestyle changes and should be commended for her success. [name] Millerfeels she has achieved her goals during cardiac rehab.   [facility] We are proud of [name] [name]'s progress and weight loss. MBarnet Pall RN,BSN 02/28/2017 2:47 PM

## 2017-02-01 NOTE — Progress Notes (Signed)
Starling Manns 71 y.o. female Nutrition Note Spoke with pt.  Nutrition Survey reviewed with pt. Pt is following Step 2 of the Therapeutic Lifestyle Changes diet. Previously discussed Constipation Nutrition Therapy with pt. Pt reports difficulty with constipation is now resolved. Pt expressed understanding of the information reviewed. Pt aware of nutrition education classes offered. No results found for: HGBA1C Wt Readings from Last 3 Encounters:  11/18/16 156 lb (70.8 kg)  11/03/16 161 lb 6 oz (73.2 kg)  09/16/16 175 lb (79.4 kg)   Nutrition Diagnosis ? Food-and nutrition-related knowledge deficit related to lack of exposure to information as related to diagnosis of: ? CVD  ? Overweight related to excessive energy intake as evidenced by a BMI of 26.7 Nutrition Intervention ? Benefits of adopting Therapeutic Lifestyle Changes discussed when Medficts reviewed. ? Pt to attend the Portion Distortion class - met 01/04/17 ? Pt previously given handouts for: ? Nutrition I class ? Nutrition II class  ? Continue client-centered nutrition education by RD, as part of interdisciplinary care.  Goal(s) ? Pt to identify food quantities necessary to achieve weight loss of 6-10 lb at graduation from cardiac rehab.   Monitor and Evaluate progress toward nutrition goal with team.  Derek Mound, M.Ed, RD, LDN, CDE 02/01/2017 3:59 PM

## 2017-02-03 ENCOUNTER — Encounter (HOSPITAL_COMMUNITY): Payer: PPO

## 2017-02-03 ENCOUNTER — Encounter (HOSPITAL_COMMUNITY)
Admission: RE | Admit: 2017-02-03 | Discharge: 2017-02-03 | Disposition: A | Discharge status: Home / Self Care | Payer: PPO | Source: Ambulatory Visit | Attending: Cardiology | Admitting: Cardiology

## 2017-02-03 DIAGNOSIS — Z955 Presence of coronary angioplasty implant and graft: Secondary | ICD-10-CM

## 2017-02-03 DIAGNOSIS — I214 Non-ST elevation (NSTEMI) myocardial infarction: Secondary | ICD-10-CM | POA: Diagnosis not present

## 2017-02-06 ENCOUNTER — Encounter (HOSPITAL_COMMUNITY): Payer: PPO

## 2017-02-06 ENCOUNTER — Encounter (HOSPITAL_COMMUNITY)
Admission: RE | Admit: 2017-02-06 | Discharge: 2017-02-06 | Disposition: A | Discharge status: Home / Self Care | Payer: PPO | Source: Ambulatory Visit | Attending: Cardiology | Admitting: Cardiology

## 2017-02-06 DIAGNOSIS — Z955 Presence of coronary angioplasty implant and graft: Secondary | ICD-10-CM

## 2017-02-06 DIAGNOSIS — I214 Non-ST elevation (NSTEMI) myocardial infarction: Secondary | ICD-10-CM

## 2017-02-08 ENCOUNTER — Encounter (HOSPITAL_COMMUNITY)
Admission: RE | Admit: 2017-02-08 | Discharge: 2017-02-08 | Disposition: A | Discharge status: Home / Self Care | Payer: PPO | Source: Ambulatory Visit | Attending: Cardiology | Admitting: Cardiology

## 2017-02-08 ENCOUNTER — Encounter (HOSPITAL_COMMUNITY): Payer: PPO

## 2017-02-08 VITALS — Wt 152.3 lb

## 2017-02-08 DIAGNOSIS — I214 Non-ST elevation (NSTEMI) myocardial infarction: Secondary | ICD-10-CM

## 2017-02-08 DIAGNOSIS — Z955 Presence of coronary angioplasty implant and graft: Secondary | ICD-10-CM

## 2017-02-10 ENCOUNTER — Encounter (HOSPITAL_COMMUNITY): Payer: PPO

## 2017-02-13 ENCOUNTER — Encounter (HOSPITAL_COMMUNITY): Payer: PPO

## 2017-02-15 ENCOUNTER — Encounter (HOSPITAL_COMMUNITY): Payer: PPO

## 2017-02-17 ENCOUNTER — Encounter (HOSPITAL_COMMUNITY): Payer: PPO

## 2017-02-20 ENCOUNTER — Encounter (HOSPITAL_COMMUNITY): Payer: PPO

## 2017-02-22 ENCOUNTER — Encounter (HOSPITAL_COMMUNITY): Payer: PPO

## 2017-02-24 ENCOUNTER — Encounter (HOSPITAL_COMMUNITY): Payer: PPO

## 2017-02-27 ENCOUNTER — Encounter (HOSPITAL_COMMUNITY): Payer: PPO

## 2017-03-01 ENCOUNTER — Encounter (HOSPITAL_COMMUNITY): Payer: PPO

## 2017-03-03 ENCOUNTER — Encounter (HOSPITAL_COMMUNITY): Payer: PPO

## 2017-04-10 DIAGNOSIS — Z1231 Encounter for screening mammogram for malignant neoplasm of breast: Secondary | ICD-10-CM | POA: Diagnosis not present

## 2017-06-12 DIAGNOSIS — Z961 Presence of intraocular lens: Secondary | ICD-10-CM | POA: Diagnosis not present

## 2017-06-12 DIAGNOSIS — H524 Presbyopia: Secondary | ICD-10-CM | POA: Diagnosis not present

## 2017-06-12 DIAGNOSIS — H52223 Regular astigmatism, bilateral: Secondary | ICD-10-CM | POA: Diagnosis not present

## 2017-07-21 ENCOUNTER — Other Ambulatory Visit: Payer: Self-pay | Admitting: Specialist

## 2017-07-21 DIAGNOSIS — R519 Headache, unspecified: Secondary | ICD-10-CM

## 2017-07-21 DIAGNOSIS — R51 Headache: Principal | ICD-10-CM

## 2017-07-24 ENCOUNTER — Other Ambulatory Visit: Payer: PPO

## 2017-07-26 ENCOUNTER — Ambulatory Visit
Admission: RE | Admit: 2017-07-26 | Discharge: 2017-07-26 | Disposition: A | Discharge status: Home / Self Care | Payer: PPO | Source: Ambulatory Visit | Attending: Specialist | Admitting: Specialist

## 2017-07-26 DIAGNOSIS — R519 Headache, unspecified: Secondary | ICD-10-CM

## 2017-07-26 DIAGNOSIS — R51 Headache: Principal | ICD-10-CM

## 2017-10-05 ENCOUNTER — Ambulatory Visit: Payer: Medicare Other | Admitting: Cardiovascular Disease

## 2017-10-05 ENCOUNTER — Encounter: Payer: Self-pay | Admitting: Cardiovascular Disease

## 2017-10-05 VITALS — BP 120/78 | HR 73 | Ht 65.0 in | Wt 168.0 lb

## 2017-10-05 DIAGNOSIS — Z7902 Long term (current) use of antithrombotics/antiplatelets: Secondary | ICD-10-CM | POA: Diagnosis not present

## 2017-10-05 DIAGNOSIS — Z5181 Encounter for therapeutic drug level monitoring: Secondary | ICD-10-CM

## 2017-10-05 DIAGNOSIS — I214 Non-ST elevation (NSTEMI) myocardial infarction: Secondary | ICD-10-CM

## 2017-10-05 DIAGNOSIS — E7800 Pure hypercholesterolemia, unspecified: Secondary | ICD-10-CM

## 2017-10-05 MED ORDER — CLOPIDOGREL BISULFATE 75 MG PO TABS: 75.0000 mg | ORAL_TABLET | Freq: Every day | ORAL | 3 refills | Status: DC

## 2017-10-05 NOTE — Assessment & Plan Note (Signed)
History of CAD status post proximal LAD stent implantation at Dr. Arva [facility]afe[facility] 183/1/18 with a 3 mm x 15 mm long Xience Alpine drug-eluting stent. Her EF was normal. The remainder of her coronary anatomy was unremarkable. She's had no symptoms of chest pain or shortness of breath since . She has but been on uninterrupted dual antiplatelet therapy including aspirin and Brilenta. I am going to transition her to Plavix and will obtain a P2 Y 12 verify now test

## 2017-10-05 NOTE — Assessment & Plan Note (Signed)
History of essential hypertension blood pressure measures 120/78. She is on metoprolol. Continue current meds at current dosing.

## 2017-10-05 NOTE — Patient Instructions (Addendum)
Medication Instructions: STOP Brilinta  START Plavix  Testing: Your physician has recommended that you wear a 14 day event monitor. Event monitors are medical devices that record the heart's electrical activity. Doctors most often us these monitors to diagnose arrhythmias. Arrhythmias are problems with the speed or rhythm of the heartbeat. The monitor is a small, portable device. You can wear one while you do your normal daily activities. This is usually used to diagnose what is causing palpitations/syncope (passing out).  Labwork: Your physician recommends that you return for lab work in: 2 weeks (P2Y12)--[facility]  Your physician recommends that you return for a FASTING lipid profile and hepatic function panel at your earliest convenience.   Follow-Up: Your physician wants you to follow-up in: 1 year with Dr. Allyson SabalBerry. You will receive a reminder letter in the mail two months in advance. If you don't receive a letter, please call our office to schedule the follow-up appointment.  If you need a refill on your cardiac medications before your next appointment, please call your pharmacy.

## 2017-10-05 NOTE — Progress Notes (Signed)
10/05/2017 [name] HatchJanice P Macneill   [birth year]  [id number]  Primary Physician Tracey HarriesBouska, David, MD Primary Cardiologist: Runell GessJonathan J Firman Petrow MD Milagros LollFACP, FACC, [place], [place]  HPI:  [name] [name] is a 72 y.o.  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 She was referred by Dr. Arma HeadingBuska , her primary care physician, for ongoing cardiovascular care because her prior cardiologist was out of network. I last saw her in the office 11/18/16.Her cardiac risk factor profile is notable for treated hypertension and hyperlipidemia. She does not smoke. She had a non-STEMI at the end of February and underwent cardiac catheterization several days later on 09/15/16 by Dr. Sharyn LullHarwani revealing a 70% proximal LAD lesion. Her troponins are minimally elevated. EKG showed no acute changes. Her ejection fraction was normal. The proximal LAD lesion underwent FFR interrogation which was 0.79 and therefore it was stented with a Xience alpine 3 mm x 15 mm long drug-eluting stent. She was discharged home on low-dose aspirin Brilenta. Shebdid participate in cardiac rehabilitation. She has had no recurrent symptoms. She does relate symptoms of statin intolerance as well.     Current Meds  Medication Sig  . acetaminophen (TYLENOL) 500 MG tablet Take 500 mg by mouth every 6 (six) hours as needed for mild pain.  Marland Kitchen. albuterol (PROVENTIL HFA;VENTOLIN HFA) 108 (90 Base) MCG/ACT inhaler Inhale 2 puffs into the lungs 4 (four) times daily as needed.  Marland Kitchen. aspirin 81 MG chewable tablet Chew 81 mg by mouth daily.  Marland Kitchen. atorvastatin (LIPITOR) 20 MG tablet Take 5 mg by mouth every other day. Patient taking half tab every other day  . baclofen (LIORESAL) 10 MG tablet Take 10 mg by mouth daily.  Marland Kitchen. docusate sodium (COLACE) 100 MG capsule Take 100 mg by mouth 2 (two) times daily.  . metoprolol tartrate (LOPRESSOR) 25 MG tablet Take 0.5 tablets (12.5 mg total) by mouth 2 (two) times  daily.  . Multiple Vitamin (MULTIVITAMIN WITH MINERALS) TABS tablet Take 1 tablet by mouth daily. centrum silver  . nitroGLYCERIN (NITROSTAT) 0.4 MG SL tablet Place 1 tablet under the tongue every 5 (five) minutes x 3 doses as needed.  . nortriptyline (PAMELOR) 25 MG capsule Take 50 mg by mouth 2 (two) times daily.   Bertram Gala. Polyethyl Glycol-Propyl Glycol (SYSTANE OP) Place 1 drop into both eyes daily as needed. For dry eyes  . ticagrelor (BRILINTA) 90 MG TABS tablet Take 1 tablet (90 mg total) by mouth 2 (two) times daily.     Allergies  Allergen Reactions  . Codeine Anaphylaxis  . Garlic Anaphylaxis  . Atorvastatin Other (See Comments)    Muscle aches  . Pravastatin Sodium Other (See Comments)    Muscle pain    Social History   Socioeconomic History  . Marital status: Married    Spouse name: Not on file  . Number of children: Not on file  . Years of education: Not on file  . Highest education level: Not on file  Occupational History  . Not on file  Social Needs  . Financial resource strain: Not on file  . Food insecurity:    Worry: Not on file    Inability: Not on file  . Transportation needs:    Medical: Not on file    Non-medical: Not on file  Tobacco Use  . Smoking status: Never Smoker  . Smokeless tobacco: Never Used  Substance and Sexual Activity  . Alcohol use:  No  . Drug use: No  . Sexual activity: Not on file  Lifestyle  . Physical activity:    Days per week: Not on file    Minutes per session: Not on file  . Stress: Not on file  Relationships  . Social connections:    Talks on phone: Not on file    Gets together: Not on file    Attends religious service: Not on file    Active member of club or organization: Not on file    Attends meetings of clubs or organizations: Not on file    Relationship status: Not on file  . Intimate partner violence:    Fear of current or ex partner: Not on file    Emotionally abused: Not on file    Physically abused: Not on  file    Forced sexual activity: Not on file  Other Topics Concern  . Not on file  Social History Narrative  . Not on file     Review of Systems: General: negative for chills, fever, night sweats or weight changes.  Cardiovascular: negative for chest pain, dyspnea on exertion, edema, orthopnea, palpitations, paroxysmal nocturnal dyspnea or shortness of breath Dermatological: negative for rash Respiratory: negative for cough or wheezing Urologic: negative for hematuria Abdominal: negative for nausea, vomiting, diarrhea, bright red blood per rectum, melena, or hematemesis Neurologic: negative for visual changes, syncope, or dizziness All other systems reviewed and are otherwise negative except as noted above.    Blood pressure 120/78, pulse 73, height 5\' 5"  (1.651 m), weight 168 lb (76.2 kg).  General appearance: alert and no distress Neck: no adenopathy, no carotid bruit, no JVD, supple, symmetrical, trachea midline and thyroid not enlarged, symmetric, no tenderness/mass/nodules Lungs: clear to auscultation bilaterally Heart: regular rate and rhythm, S1, S2 normal, no murmur, click, rub or gallop Extremities: extremities normal, atraumatic, no cyanosis or edema Pulses: 2+ and symmetric Skin: Skin color, texture, turgor normal. No rashes or lesions Neurologic: Alert and oriented X 3, normal strength and tone. Normal symmetric reflexes. Normal coordination and gait  EKG sinus rhythm at 73 without ST or T-wave changes. I  Personally reviewed this EKG.  ASSESSMENT AND PLAN:   HYPERTENSION History of essential hypertension blood pressure measures 120/78. She is on metoprolol. Continue current meds at current dosing.  Acute non Q wave MI (myocardial infarction), initial episode of care [facility]) History of CAD status post proximal LAD stent implantation at Dr. Arva Chafe 183/1/18 with a 3 mm x 15 mm long Xience Alpine drug-eluting stent. Her EF was normal. The remainder of her coronary anatomy  was unremarkable. She's had no symptoms of chest pain or shortness of breath since . She has but been on uninterrupted dual antiplatelet therapy including aspirin and Brilenta. I am going to transition her to Plavix and will obtain a P2 Y 12 verify now test  Hyperlipidemia Wishes hyperlipidemia intolerant to statin therapy. We will recheck a fasting lipid liver profile and will discuss the possibility of beginning Repatha       Runell Gess MD [facility], [facility] 10/05/2017 9:13 AM

## 2017-10-05 NOTE — Assessment & Plan Note (Signed)
Wishes hyperlipidemia intolerant to statin therapy. We will recheck a fasting lipid liver profile and will discuss the possibility of beginning Repatha

## 2017-10-06 ENCOUNTER — Telehealth: Payer: Self-pay | Admitting: Pharmacist Clinician (PhC)/ Clinical Pharmacy Specialist

## 2017-10-06 NOTE — Telephone Encounter (Signed)
Spoke with patient while she was in the office to see Dr. Allyson SabalBerry on March 21.   She has challenged with statin drugs on 3 different occasions, and only been able to tolerate them for up to 1 week.  She developed myalgias on atorvastatin 80 as well as atorvastatin 20 mg and pravastatin 40 mg.    Reviewed mechanism of PCSK-9 inhibitors, dosing instructions and side effects.   Patient will get labs drawn in next week then we can submit to insurance

## 2017-10-09 ENCOUNTER — Encounter: Payer: Self-pay | Admitting: Cardiovascular Disease

## 2017-10-10 LAB — LIPID PANEL
CHOL/HDL RATIO: 4.3 ratio (ref 0.0–4.4)
Cholesterol, Total: 232 mg/dL — ABNORMAL HIGH (ref 100–199)
HDL: 54 mg/dL (ref >39)
LDL Calculated: 145 mg/dL — ABNORMAL HIGH (ref 0–99)
TRIGLYCERIDES: 167 mg/dL — AB (ref 0–149)
VLDL Cholesterol Cal: 33 mg/dL (ref 5–40)

## 2017-10-10 LAB — HEPATIC FUNCTION PANEL
ALBUMIN: 4 g/dL (ref 3.5–4.8)
ALK PHOS: 59 IU/L (ref 39–117)
ALT: 17 IU/L (ref 0–32)
AST: 20 IU/L (ref 0–40)
Bilirubin Total: 0.3 mg/dL (ref 0.0–1.2)
Bilirubin, Direct: 0.1 mg/dL (ref 0.00–0.40)
Total Protein: 6.6 g/dL (ref 6.0–8.5)

## 2017-10-13 ENCOUNTER — Ambulatory Visit: Payer: Medicare Other | Admitting: Cardiovascular Disease

## 2017-10-18 ENCOUNTER — Other Ambulatory Visit: Payer: Self-pay | Admitting: Cardiovascular Disease

## 2017-10-18 ENCOUNTER — Ambulatory Visit (INDEPENDENT_AMBULATORY_CARE_PROVIDER_SITE_OTHER): Payer: Medicare Other

## 2017-10-18 DIAGNOSIS — Z955 Presence of coronary angioplasty implant and graft: Secondary | ICD-10-CM

## 2017-10-18 DIAGNOSIS — R002 Palpitations: Secondary | ICD-10-CM

## 2017-10-20 ENCOUNTER — Ambulatory Visit: Payer: Medicare Other | Admitting: Cardiovascular Disease

## 2017-10-24 ENCOUNTER — Telehealth: Payer: Self-pay | Admitting: Pharmacist Clinician (PhC)/ Clinical Pharmacy Specialist

## 2017-10-24 MED ORDER — EVOLOCUMAB 140 MG/ML ~~LOC~~ SOAJ: 140.0000 mg | SUBCUTANEOUS | 12 refills | Status: DC

## 2017-10-24 NOTE — Telephone Encounter (Signed)
Patient to call pharmacy and determine copay.  If unable to afford will call then come by the office to fill out Safety Net paperwork.

## 2017-11-03 ENCOUNTER — Telehealth: Payer: Self-pay | Admitting: Pharmacist

## 2017-11-03 NOTE — Telephone Encounter (Signed)
[employer] safetyNet  aproved Repatha free of charge until Dec/31.  Patient already scheduled the 1st shipment and will call office once received to schedule a time for administration training.

## 2017-12-06 ENCOUNTER — Telehealth: Payer: Self-pay | Admitting: *Deleted

## 2017-12-06 NOTE — Telephone Encounter (Addendum)
Left message for pt to call   ----- Message from Runell Gess, MD sent at 12/06/2017  2:43 PM EDT ----- 1. SR/SB/ST with PACs

## 2017-12-07 NOTE — Telephone Encounter (Signed)
Spoke with pt, aware of monitor results. 

## 2017-12-07 NOTE — Telephone Encounter (Signed)
Follow up: ° ° °Patient returning call  ° ° ° °

## 2018-01-02 ENCOUNTER — Other Ambulatory Visit: Payer: Self-pay | Admitting: Pharmacist Clinician (PhC)/ Clinical Pharmacy Specialist

## 2018-01-02 DIAGNOSIS — E7800 Pure hypercholesterolemia, unspecified: Secondary | ICD-10-CM

## 2018-01-08 LAB — HEPATIC FUNCTION PANEL
ALBUMIN: 4 g/dL (ref 3.5–4.8)
ALT: 15 IU/L (ref 0–32)
AST: 17 IU/L (ref 0–40)
Alkaline Phosphatase: 55 IU/L (ref 39–117)
BILIRUBIN TOTAL: 0.4 mg/dL (ref 0.0–1.2)
Bilirubin, Direct: 0.14 mg/dL (ref 0.00–0.40)
Total Protein: 6.5 g/dL (ref 6.0–8.5)

## 2018-01-08 LAB — LIPID PANEL
CHOLESTEROL TOTAL: 113 mg/dL (ref 100–199)
Chol/HDL Ratio: 2 ratio (ref 0.0–4.4)
HDL: 56 mg/dL (ref >39)
LDL Calculated: 31 mg/dL (ref 0–99)
TRIGLYCERIDES: 132 mg/dL (ref 0–149)
VLDL Cholesterol Cal: 26 mg/dL (ref 5–40)

## 2018-01-09 ENCOUNTER — Encounter: Payer: Self-pay | Admitting: *Deleted

## 2018-02-22 ENCOUNTER — Other Ambulatory Visit: Payer: Self-pay | Admitting: *Deleted

## 2018-02-22 MED ORDER — NITROGLYCERIN 0.4 MG SL SUBL: 0.4000 mg | SUBLINGUAL_TABLET | SUBLINGUAL | 2 refills | Status: DC | PRN

## 2018-02-22 MED ORDER — METOPROLOL TARTRATE 25 MG PO TABS: 12.5000 mg | ORAL_TABLET | Freq: Two times a day (BID) | ORAL | 2 refills | Status: DC

## 2018-05-10 ENCOUNTER — Encounter: Payer: Self-pay | Admitting: Cardiology

## 2018-05-10 ENCOUNTER — Ambulatory Visit: Payer: Medicare Other | Admitting: Cardiology

## 2018-05-10 DIAGNOSIS — I2510 Atherosclerotic heart disease of native coronary artery without angina pectoris: Secondary | ICD-10-CM | POA: Diagnosis not present

## 2018-05-10 DIAGNOSIS — R079 Chest pain, unspecified: Secondary | ICD-10-CM

## 2018-05-10 DIAGNOSIS — Z9861 Coronary angioplasty status: Secondary | ICD-10-CM

## 2018-05-10 DIAGNOSIS — E785 Hyperlipidemia, unspecified: Secondary | ICD-10-CM | POA: Diagnosis not present

## 2018-05-10 DIAGNOSIS — R0600 Dyspnea, unspecified: Secondary | ICD-10-CM | POA: Insufficient documentation

## 2018-05-10 DIAGNOSIS — R0609 Other forms of dyspnea: Secondary | ICD-10-CM | POA: Insufficient documentation

## 2018-05-10 LAB — CBC
Hematocrit: 37.7 % (ref 34.0–46.6)
Hemoglobin: 12.6 g/dL (ref 11.1–15.9)
MCH: 29 pg (ref 26.6–33.0)
MCHC: 33.4 g/dL (ref 31.5–35.7)
MCV: 87 fL (ref 79–97)
Platelets: 287 10*3/uL (ref 150–450)
RBC: 4.35 x10E6/uL (ref 3.77–5.28)
RDW: 13.6 % (ref 12.3–15.4)
WBC: 5.3 10*3/uL (ref 3.4–10.8)

## 2018-05-10 LAB — BASIC METABOLIC PANEL
BUN/Creatinine Ratio: 21 (ref 12–28)
BUN: 17 mg/dL (ref 8–27)
CO2: 22 mmol/L (ref 20–29)
Calcium: 10.1 mg/dL (ref 8.7–10.3)
Chloride: 106 mmol/L (ref 96–106)
Creatinine, Ser: 0.82 mg/dL (ref 0.57–1.00)
GFR calc Af Amer: 83 mL/min/{1.73_m2} (ref >59)
GFR calc non Af Amer: 72 mL/min/{1.73_m2} (ref >59)
Glucose: 86 mg/dL (ref 65–99)
Potassium: 4.9 mmol/L (ref 3.5–5.2)
Sodium: 142 mmol/L (ref 134–144)

## 2018-05-10 NOTE — Assessment & Plan Note (Signed)
LAD PCI with DES by Dr Sharyn Lull  09/15/16 70% LAD with no other significant CAD and normal LVF

## 2018-05-10 NOTE — Assessment & Plan Note (Signed)
Pt had an episode of SSCP with radiation to her jaw 4 weeks ago that lasted 2 hours

## 2018-05-10 NOTE — Progress Notes (Addendum)
05/10/2018 [name] [name]   05/26/1946  [id number]  Primary Physician Tracey Harries, MD Primary Cardiologist: Dr Allyson Sabal  HPI:  Pleasant 72 y/o female with a history of CAD, s/p NSTEMI and LAD PCI in March 2018. She had a 70% LAD lesion and no other significant CAD. She did well after this. In Sept of this year she had an episode of SSCP associated with radiation to her jaw and SOB. She says it was similar to her pre PCI symptoms. It came on after she carried her sewing machine and supplies into group sewing event. She was about to contact her PCP but her symptoms spontaneously resolved. Since then she has noted increased DOE. She says sweeping makes her profoundly weak and SOB and she has to stop. She denies any rest symptoms. She saw Dr Everlene Other and he suggested she see Dr Allyson Sabal.    Current Outpatient Medications  Medication Sig Dispense Refill  . acetaminophen (TYLENOL) 500 MG tablet Take 500 mg by mouth every 6 (six) hours as needed for mild pain.    Marland Kitchen aspirin 81 MG chewable tablet Chew 81 mg by mouth daily.    Marland Kitchen atorvastatin (LIPITOR) 20 MG tablet Take 5 mg by mouth every other day. Patient taking half tab every other day    . baclofen (LIORESAL) 10 MG tablet Take 10 mg by mouth daily.    . clopidogrel (PLAVIX) 75 MG tablet Take 1 tablet (75 mg total) by mouth daily. 90 tablet 3  . docusate sodium (COLACE) 100 MG capsule Take 100 mg by mouth 2 (two) times daily.    . Evolocumab (REPATHA SURECLICK) 140 MG/ML SOAJ Inject 140 mg into the skin every 14 (fourteen) days. 2 pen 12  . metoprolol tartrate (LOPRESSOR) 25 MG tablet Take 0.5 tablets (12.5 mg total) by mouth 2 (two) times daily. 90 tablet 2  . Multiple Vitamin (MULTIVITAMIN WITH MINERALS) TABS tablet Take 1 tablet by mouth daily. centrum silver    . nitroGLYCERIN (NITROSTAT) 0.4 MG SL tablet Place 1 tablet (0.4 mg total) under the tongue every 5 (five) minutes x 3 doses as needed. 75 tablet 2  . nortriptyline (PAMELOR) 25 MG capsule  Take 50 mg by mouth 2 (two) times daily.     Bertram Gala Glycol-Propyl Glycol (SYSTANE OP) Place 1 drop into both eyes daily as needed. For dry eyes    . Vitamin D, Ergocalciferol, (DRISDOL) 50000 units CAPS capsule Take 50,000 Units by mouth every 7 (seven) days.    Marland Kitchen albuterol (PROVENTIL HFA;VENTOLIN HFA) 108 (90 Base) MCG/ACT inhaler Inhale 2 puffs into the lungs 4 (four) times daily as needed.     No current facility-administered medications for this visit.     Allergies  Allergen Reactions  . Codeine Anaphylaxis  . Garlic Anaphylaxis  . Atorvastatin Other (See Comments)    Muscle aches  . Pravastatin Sodium Other (See Comments)    Muscle pain    Past Medical History:  Diagnosis Date  . Arthritis    "knees, hips, hands, back" (09/15/2016)  . Constipation   . Coronary artery disease    NSTEMI-PCI March 2018  . Exercise-induced asthma   . Family history of adverse reaction to anesthesia    "daughter gets PONV too"  . GERD (gastroesophageal reflux disease)   . Heart murmur dx'd 09/14/2016  . High cholesterol   . Hypertension   . Migraine    "recently had series of injections; none since; they had been weekly 1 month  ago" (09/15/2016)  . Necrotizing fasciitis (HCC)    R index finger- had a amp  . NSTEMI (non-ST elevated myocardial infarction) (HCC)   . Pneumonia ~ 2003  . PONV (postoperative nausea and vomiting)    Patch helps  . RSD (reflex sympathetic dystrophy)    Right arm after surgery    Social History   Socioeconomic History  . Marital status: Married    Spouse name: Not on file  . Number of children: Not on file  . Years of education: Not on file  . Highest education level: Not on file  Occupational History  . Not on file  Social Needs  . Financial resource strain: Not on file  . Food insecurity:    Worry: Not on file    Inability: Not on file  . Transportation needs:    Medical: Not on file    Non-medical: Not on file  Tobacco Use  . Smoking status:  Never Smoker  . Smokeless tobacco: Never Used  Substance and Sexual Activity  . Alcohol use: No  . Drug use: No  . Sexual activity: Not on file  Lifestyle  . Physical activity:    Days per week: Not on file    Minutes per session: Not on file  . Stress: Not on file  Relationships  . Social connections:    Talks on phone: Not on file    Gets together: Not on file    Attends religious service: Not on file    Active member of club or organization: Not on file    Attends meetings of clubs or organizations: Not on file    Relationship status: Not on file  . Intimate partner violence:    Fear of current or ex partner: Not on file    Emotionally abused: Not on file    Physically abused: Not on file    Forced sexual activity: Not on file  Other Topics Concern  . Not on file  Social History Narrative  . Not on file     Family History  Problem Relation Age of Onset  . Heart failure Mother   . Cancer Mother 39       Breast  . Kidney failure Father   . Brain cancer Sister      Review of Systems: General: negative for chills, fever, night sweats or weight changes.  Cardiovascular: negative for chest pain, dyspnea on exertion, edema, orthopnea, palpitations, paroxysmal nocturnal dyspnea or shortness of breath Dermatological: negative for rash Respiratory: negative for cough or wheezing Urologic: negative for hematuria Abdominal: negative for nausea, vomiting, diarrhea, bright red blood per rectum, melena, or hematemesis Neurologic: negative for visual changes, syncope, or dizziness She has a history of necrotizing fasciitis of her Rt hand. 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 She prefers her cath be done via Rt FA.  All other systems reviewed and are otherwise negative except as noted above.    Blood pressure 126/80, pulse 76, height 5\' 5"  (1.651 m), weight 171 lb 9.6 oz (77.8  kg).  General appearance: alert, cooperative and no distress Neck: no carotid bruit and no JVD Lungs: clear to auscultation bilaterally Heart: regular rate and rhythm Extremities: no edema Skin: Skin color, texture, turgor normal. No rashes or lesions Neurologic: Grossly normal  EKG NSR  ASSESSMENT AND PLAN:   DOE (dyspnea on exertion) Recent onset- concerning for  angina equivalent   Chest pain with high risk of acute coronary syndrome Pt had an episode of SSCP with radiation to her jaw 4 weeks ago that lasted 2 hours  CAD S/P PCI LAD PCI with DES by Dr Sharyn Lull  09/15/16 70% LAD with no other significant CAD and normal LVF  Dyslipidemia, goal LDL below 70 She has statin intolerance. She is on low dose Lipitor and Evolocumab   PLAN  Reviewed with Dr Royann Shivers today in the office. Will proceed with diagnostic cath. The patient understands that risks included but are not limited to stroke (1 in 1000), death (1 in 1000), kidney failure [usually temporary] (1 in 500), bleeding (1 in 200), allergic reaction [possibly serious] (1 in 200).  The patient understands and agrees to proceed. She prefers her cath be done via RFA- she had necrotizing fasciitis of her Rt index finger in 2008 which resulted in amputation. After that she suffered from RSD in her Rt arm, though currently that is stable. She has a current Rx for SL NTG.    Corine Shelter PA-C 05/10/2018 9:36 AM

## 2018-05-10 NOTE — Assessment & Plan Note (Signed)
Recent onset- concerning for angina equivalent

## 2018-05-10 NOTE — Assessment & Plan Note (Signed)
She has statin intolerance. She is on low dose Lipitor and Evolocumab

## 2018-05-10 NOTE — H&P (View-Only) (Signed)
  05/10/2018 [name] [name]   03/02/1946  [id number]  Primary Physician Bouska, David, MD Primary Cardiologist: Dr Berry  HPI:  Pleasant 72 y/o female with a history of CAD, s/p NSTEMI and LAD PCI in March 2018. She had a 70% LAD lesion and no other significant CAD. She did well after this. In Sept of this year she had an episode of SSCP associated with radiation to her jaw and SOB. She says it was similar to her pre PCI symptoms. It came on after she carried her sewing machine and supplies into group sewing event. She was about to contact her PCP but her symptoms spontaneously resolved. Since then she has noted increased DOE. She says sweeping makes her profoundly weak and SOB and she has to stop. She denies any rest symptoms. She saw Dr Bouska and he suggested she see Dr Berry.    Current Outpatient Medications  Medication Sig Dispense Refill  . acetaminophen (TYLENOL) 500 MG tablet Take 500 mg by mouth every 6 (six) hours as needed for mild pain.    . aspirin 81 MG chewable tablet Chew 81 mg by mouth daily.    . atorvastatin (LIPITOR) 20 MG tablet Take 5 mg by mouth every other day. Patient taking half tab every other day    . baclofen (LIORESAL) 10 MG tablet Take 10 mg by mouth daily.    . clopidogrel (PLAVIX) 75 MG tablet Take 1 tablet (75 mg total) by mouth daily. 90 tablet 3  . docusate sodium (COLACE) 100 MG capsule Take 100 mg by mouth 2 (two) times daily.    . Evolocumab (REPATHA SURECLICK) 140 MG/ML SOAJ Inject 140 mg into the skin every 14 (fourteen) days. 2 pen 12  . metoprolol tartrate (LOPRESSOR) 25 MG tablet Take 0.5 tablets (12.5 mg total) by mouth 2 (two) times daily. 90 tablet 2  . Multiple Vitamin (MULTIVITAMIN WITH MINERALS) TABS tablet Take 1 tablet by mouth daily. centrum silver    . nitroGLYCERIN (NITROSTAT) 0.4 MG SL tablet Place 1 tablet (0.4 mg total) under the tongue every 5 (five) minutes x 3 doses as needed. 75 tablet 2  . nortriptyline (PAMELOR) 25 MG capsule  Take 50 mg by mouth 2 (two) times daily.     . Polyethyl Glycol-Propyl Glycol (SYSTANE OP) Place 1 drop into both eyes daily as needed. For dry eyes    . Vitamin D, Ergocalciferol, (DRISDOL) 50000 units CAPS capsule Take 50,000 Units by mouth every 7 (seven) days.    . albuterol (PROVENTIL HFA;VENTOLIN HFA) 108 (90 Base) MCG/ACT inhaler Inhale 2 puffs into the lungs 4 (four) times daily as needed.     No current facility-administered medications for this visit.     Allergies  Allergen Reactions  . Codeine Anaphylaxis  . Garlic Anaphylaxis  . Atorvastatin Other (See Comments)    Muscle aches  . Pravastatin Sodium Other (See Comments)    Muscle pain    Past Medical History:  Diagnosis Date  . Arthritis    "knees, hips, hands, back" (09/15/2016)  . Constipation   . Coronary artery disease    NSTEMI-PCI March 2018  . Exercise-induced asthma   . Family history of adverse reaction to anesthesia    "daughter gets PONV too"  . GERD (gastroesophageal reflux disease)   . Heart murmur dx'd 09/14/2016  . High cholesterol   . Hypertension   . Migraine    "recently had series of injections; none since; they had been weekly 1 month   ago" (09/15/2016)  . Necrotizing fasciitis (HCC)    R index finger- had a amp  . NSTEMI (non-ST elevated myocardial infarction) (HCC)   . Pneumonia ~ 2003  . PONV (postoperative nausea and vomiting)    Patch helps  . RSD (reflex sympathetic dystrophy)    Right arm after surgery    Social History   Socioeconomic History  . Marital status: Married    Spouse name: Not on file  . Number of children: Not on file  . Years of education: Not on file  . Highest education level: Not on file  Occupational History  . Not on file  Social Needs  . Financial resource strain: Not on file  . Food insecurity:    Worry: Not on file    Inability: Not on file  . Transportation needs:    Medical: Not on file    Non-medical: Not on file  Tobacco Use  . Smoking status:  Never Smoker  . Smokeless tobacco: Never Used  Substance and Sexual Activity  . Alcohol use: No  . Drug use: No  . Sexual activity: Not on file  Lifestyle  . Physical activity:    Days per week: Not on file    Minutes per session: Not on file  . Stress: Not on file  Relationships  . Social connections:    Talks on phone: Not on file    Gets together: Not on file    Attends religious service: Not on file    Active member of club or organization: Not on file    Attends meetings of clubs or organizations: Not on file    Relationship status: Not on file  . Intimate partner violence:    Fear of current or ex partner: Not on file    Emotionally abused: Not on file    Physically abused: Not on file    Forced sexual activity: Not on file  Other Topics Concern  . Not on file  Social History Narrative  . Not on file     Family History  Problem Relation Age of Onset  . Heart failure Mother   . Cancer Mother 82       Breast  . Kidney failure Father   . Brain cancer Sister      Review of Systems: General: negative for chills, fever, night sweats or weight changes.  Cardiovascular: negative for chest pain, dyspnea on exertion, edema, orthopnea, palpitations, paroxysmal nocturnal dyspnea or shortness of breath Dermatological: negative for rash Respiratory: negative for cough or wheezing Urologic: negative for hematuria Abdominal: negative for nausea, vomiting, diarrhea, bright red blood per rectum, melena, or hematemesis Neurologic: negative for visual changes, syncope, or dizziness She has a history of necrotizing fasciitis of her Rt hand. 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 She prefers her cath be done via Rt FA.  All other systems reviewed and are otherwise negative except as noted above.    Blood pressure 126/80, pulse 76, height 5' 5" (1.651 m), weight 171 lb 9.6 oz (77.8  kg).  General appearance: alert, cooperative and no distress Neck: no carotid bruit and no JVD Lungs: clear to auscultation bilaterally Heart: regular rate and rhythm Extremities: no edema Skin: Skin color, texture, turgor normal. No rashes or lesions Neurologic: Grossly normal  EKG NSR  ASSESSMENT AND PLAN:   DOE (dyspnea on exertion) Recent onset- concerning for   angina equivalent   Chest pain with high risk of acute coronary syndrome Pt had an episode of SSCP with radiation to her jaw 4 weeks ago that lasted 2 hours  CAD S/P PCI LAD PCI with DES by Dr Harwani  09/15/16 70% LAD with no other significant CAD and normal LVF  Dyslipidemia, goal LDL below 70 She has statin intolerance. She is on low dose Lipitor and Evolocumab   PLAN  Reviewed with Dr Croitoru today in the office. Will proceed with diagnostic cath. The patient understands that risks included but are not limited to stroke (1 in 1000), death (1 in 1000), kidney failure [usually temporary] (1 in 500), bleeding (1 in 200), allergic reaction [possibly serious] (1 in 200).  The patient understands and agrees to proceed. She prefers her cath be done via RFA- she had necrotizing fasciitis of her Rt index finger in 2008 which resulted in amputation. After that she suffered from RSD in her Rt arm, though currently that is stable. She has a current Rx for SL NTG.    Shia Eber PA-C 05/10/2018 9:36 AM 

## 2018-05-10 NOTE — Patient Instructions (Addendum)
Medication Instructions:  Your physician recommends that you continue on your current medications as directed. Please refer to the Current Medication list given to you today.  If you need a refill on your cardiac medications before your next appointment, please call your pharmacy.   Lab work: Your physician recommends that you return for lab work in: TODAY-BMET, CBC If you have labs (blood work) drawn today and your tests are completely normal, you will receive your results only by: Marland Kitchen MyChart Message (if you have MyChart) OR . A paper copy in the mail If you have any lab test that is abnormal or we need to change your treatment, we will call you to review the results.  Testing/Procedures: Your physician has requested that you have a cardiac catheterization. Cardiac catheterization is used to diagnose and/or treat various heart conditions. Doctors may recommend this procedure for a number of different reasons. The most common reason is to evaluate chest pain. Chest pain can be a symptom of coronary artery disease (CAD), and cardiac catheterization can show whether plaque is narrowing or blocking your heart's arteries. This procedure is also used to evaluate the valves, as well as measure the blood flow and oxygen levels in different parts of your heart. For further information please visit [email or web address] Please follow instruction sheet, as given. SEE INSTRUCTIONS BELOW  Follow-Up: At [facility], you and your health needs are our priority.  As part of our continuing mission to provide you with exceptional heart care, we have created designated Provider Care Teams.  These Care Teams include your primary Cardiologist (physician) and Advanced Practice Providers (APPs -  Physician Assistants and Nurse Practitioners) who all work together to provide you with the care you need, when you need it. Your physician recommends that you schedule a follow-up appointment in: 3 MONTHS WITH DR Allyson Sabal.  Any  Other Special Instructions Will Be Listed Below (If Applicable).      [facility] CARDIOVASCULAR DIVISION [facility] [address] [place] [address] Dept: [phone] Loc: [phone]  [name] [name]  05/10/2018  You are scheduled for a Cardiac Catheterization on Monday, October 28 with Dr. [name].  1. Please arrive at the [facility] (Main Entrance A) at [facility]: [address] [place], [place] [address] at 12:30 PM (This time is two hours before your procedure to ensure your preparation). Free valet parking service is available.   Special note: Every effort is made to have your procedure done on time. Please understand that emergencies sometimes delay scheduled procedures.  2. Diet: Do not eat solid foods after midnight.  The patient may have clear liquids until 5am upon the day of the procedure.  3. Labs: You will need to have blood drawn on Thursday, October 24 at [facility] [address], [place]  Open: 8am - 5pm (Lunch 12:30 - 1:30)   Phone: [phone]. You do not need to be fasting.  4. Medication instructions in preparation for your procedure:   Contrast Allergy: No    Current Outpatient Medications (Cardiovascular):  .  atorvastatin (LIPITOR) 20 MG tablet, Take 5 mg by mouth every other day. Patient taking half tab every other day .  Evolocumab (REPATHA SURECLICK) 140 MG/ML SOAJ, Inject 140 mg into the skin every 14 (fourteen) days. .  metoprolol tartrate (LOPRESSOR) 25 MG tablet, Take 0.5 tablets (12.5 mg total) by mouth 2 (two) times daily. .  nitroGLYCERIN (NITROSTAT) 0.4 MG SL tablet, Place 1  tablet (0.4 mg total) under the tongue every 5 (five) minutes x 3 doses as needed.  Current Outpatient Medications (Respiratory):  .  albuterol (PROVENTIL HFA;VENTOLIN HFA) 108 (90 Base) MCG/ACT inhaler, Inhale 2 puffs into the lungs 4 (four) times daily as needed.  Current  Outpatient Medications (Analgesics):  .  acetaminophen (TYLENOL) 500 MG tablet, Take 500 mg by mouth every 6 (six) hours as needed for mild pain. Marland Kitchen  aspirin 81 MG chewable tablet, Chew 81 mg by mouth daily.  Current Outpatient Medications (Hematological):  .  clopidogrel (PLAVIX) 75 MG tablet, Take 1 tablet (75 mg total) by mouth daily.  Current Outpatient Medications (Other):  .  baclofen (LIORESAL) 10 MG tablet, Take 10 mg by mouth daily. Marland Kitchen  docusate sodium (COLACE) 100 MG capsule, Take 100 mg by mouth 2 (two) times daily. .  Multiple Vitamin (MULTIVITAMIN WITH MINERALS) TABS tablet, Take 1 tablet by mouth daily. centrum silver .  nortriptyline (PAMELOR) 25 MG capsule, Take 50 mg by mouth 2 (two) times daily.  Bertram Gala Glycol-Propyl Glycol (SYSTANE OP), Place 1 drop into both eyes daily as needed. For dry eyes .  Vitamin D, Ergocalciferol, (DRISDOL) 50000 units CAPS capsule, Take 50,000 Units by mouth every 7 (seven) days.  On the morning of your procedure, take your Aspirin and any morning medicines NOT listed above.  You may use sips of water.  5. Plan for one night stay--bring personal belongings. 6. Bring a current list of your medications and current insurance cards. 7. You MUST have a responsible person to drive you home. 8. Someone MUST be with you the first 24 hours after you arrive home or your discharge will be delayed. 9. Please wear clothes that are easy to get on and off and wear slip-on shoes.  Thank you for allowing [place] to care for you!   -- [facility] +-+++++++++

## 2018-05-11 ENCOUNTER — Encounter: Payer: Self-pay | Admitting: Cardiology

## 2018-05-13 ENCOUNTER — Telehealth: Payer: Self-pay | Admitting: Pharmacist Clinician (PhC)/ Clinical Pharmacy Specialist

## 2018-05-13 DIAGNOSIS — E7800 Pure hypercholesterolemia, unspecified: Secondary | ICD-10-CM

## 2018-05-13 NOTE — Telephone Encounter (Signed)
Mailed new Safety Net application and lab order 

## 2018-05-14 ENCOUNTER — Other Ambulatory Visit: Payer: Self-pay

## 2018-05-14 ENCOUNTER — Ambulatory Visit (HOSPITAL_COMMUNITY)
Admission: RE | Admit: 2018-05-14 | Discharge: 2018-05-15 | Disposition: A | Discharge status: Home / Self Care | Payer: Medicare Other | Source: Ambulatory Visit | Attending: Cardiovascular Disease | Admitting: Cardiovascular Disease

## 2018-05-14 ENCOUNTER — Encounter (HOSPITAL_COMMUNITY): Payer: Self-pay | Admitting: General Practice

## 2018-05-14 ENCOUNTER — Encounter (HOSPITAL_COMMUNITY)
Admission: RE | Disposition: A | Discharge status: Home / Self Care | Payer: Self-pay | Source: Ambulatory Visit | Attending: Cardiovascular Disease

## 2018-05-14 DIAGNOSIS — R0602 Shortness of breath: Secondary | ICD-10-CM | POA: Insufficient documentation

## 2018-05-14 DIAGNOSIS — I10 Essential (primary) hypertension: Secondary | ICD-10-CM | POA: Diagnosis not present

## 2018-05-14 DIAGNOSIS — Z8249 Family history of ischemic heart disease and other diseases of the circulatory system: Secondary | ICD-10-CM | POA: Diagnosis not present

## 2018-05-14 DIAGNOSIS — Z79899 Other long term (current) drug therapy: Secondary | ICD-10-CM | POA: Insufficient documentation

## 2018-05-14 DIAGNOSIS — Z955 Presence of coronary angioplasty implant and graft: Secondary | ICD-10-CM | POA: Diagnosis not present

## 2018-05-14 DIAGNOSIS — Z91018 Allergy to other foods: Secondary | ICD-10-CM | POA: Insufficient documentation

## 2018-05-14 DIAGNOSIS — Z888 Allergy status to other drugs, medicaments and biological substances status: Secondary | ICD-10-CM | POA: Diagnosis not present

## 2018-05-14 DIAGNOSIS — I25119 Atherosclerotic heart disease of native coronary artery with unspecified angina pectoris: Secondary | ICD-10-CM | POA: Diagnosis not present

## 2018-05-14 DIAGNOSIS — I2510 Atherosclerotic heart disease of native coronary artery without angina pectoris: Secondary | ICD-10-CM

## 2018-05-14 DIAGNOSIS — Z7902 Long term (current) use of antithrombotics/antiplatelets: Secondary | ICD-10-CM | POA: Diagnosis not present

## 2018-05-14 DIAGNOSIS — Z7982 Long term (current) use of aspirin: Secondary | ICD-10-CM | POA: Insufficient documentation

## 2018-05-14 DIAGNOSIS — Z885 Allergy status to narcotic agent status: Secondary | ICD-10-CM | POA: Insufficient documentation

## 2018-05-14 DIAGNOSIS — I252 Old myocardial infarction: Secondary | ICD-10-CM | POA: Insufficient documentation

## 2018-05-14 DIAGNOSIS — M1990 Unspecified osteoarthritis, unspecified site: Secondary | ICD-10-CM | POA: Diagnosis not present

## 2018-05-14 DIAGNOSIS — E785 Hyperlipidemia, unspecified: Secondary | ICD-10-CM | POA: Diagnosis not present

## 2018-05-14 DIAGNOSIS — K219 Gastro-esophageal reflux disease without esophagitis: Secondary | ICD-10-CM | POA: Diagnosis not present

## 2018-05-14 HISTORY — PX: CORONARY ANGIOPLASTY: SHX604

## 2018-05-14 HISTORY — PX: CORONARY BALLOON ANGIOPLASTY: CATH118233

## 2018-05-14 HISTORY — PX: CORONARY STENT INTERVENTION: CATH118234

## 2018-05-14 HISTORY — PX: LEFT HEART CATH AND CORONARY ANGIOGRAPHY: CATH118249

## 2018-05-14 LAB — POCT ACTIVATED CLOTTING TIME: ACTIVATED CLOTTING TIME: 455 s

## 2018-05-14 SURGERY — LEFT HEART CATH AND CORONARY ANGIOGRAPHY
Anesthesia: LOCAL

## 2018-05-14 MED ORDER — LISINOPRIL 10 MG PO TABS
20.0000 mg | ORAL_TABLET | Freq: Every evening | ORAL | Status: DC
  Administered 2018-05-14: 18:00:00 20 mg via ORAL
  Filled 2018-05-14: qty 2

## 2018-05-14 MED ORDER — LIDOCAINE HCL (PF) 1 % IJ SOLN
INTRAMUSCULAR | Status: AC
  Filled 2018-05-14: qty 30

## 2018-05-14 MED ORDER — ASPIRIN 81 MG PO CHEW: 81.0000 mg | CHEWABLE_TABLET | Freq: Every day | ORAL | Status: DC

## 2018-05-14 MED ORDER — SODIUM CHLORIDE 0.9 % IV SOLN
INTRAVENOUS | Status: AC
  Administered 2018-05-14: 23:00:00 via INTRAVENOUS

## 2018-05-14 MED ORDER — BIVALIRUDIN BOLUS VIA INFUSION - CUPID
INTRAVENOUS | Status: DC | PRN
  Administered 2018-05-14: 56.1 mg via INTRAVENOUS

## 2018-05-14 MED ORDER — SODIUM CHLORIDE 0.9 % IV SOLN
INTRAVENOUS | Status: DC | PRN
  Administered 2018-05-14: 1.75 mg/kg/h via INTRAVENOUS

## 2018-05-14 MED ORDER — SODIUM CHLORIDE 0.9% FLUSH: 3.0000 mL | INTRAVENOUS | Status: DC | PRN

## 2018-05-14 MED ORDER — IOHEXOL 350 MG/ML SOLN
INTRAVENOUS | Status: DC | PRN
  Administered 2018-05-14: 165 mL via INTRACARDIAC

## 2018-05-14 MED ORDER — SODIUM CHLORIDE 0.9 % WEIGHT BASED INFUSION: 1.0000 mL/kg/h | INTRAVENOUS | Status: DC

## 2018-05-14 MED ORDER — ADENOSINE 12 MG/4ML IV SOLN
INTRAVENOUS | Status: AC
  Filled 2018-05-14: qty 16

## 2018-05-14 MED ORDER — ASPIRIN EC 81 MG PO TBEC
81.0000 mg | DELAYED_RELEASE_TABLET | Freq: Every day | ORAL | Status: DC
  Administered 2018-05-15: 81 mg via ORAL
  Filled 2018-05-14: qty 1

## 2018-05-14 MED ORDER — LIDOCAINE HCL (PF) 1 % IJ SOLN
INTRAMUSCULAR | Status: DC | PRN
  Administered 2018-05-14: 2 mL

## 2018-05-14 MED ORDER — METOPROLOL TARTRATE 12.5 MG HALF TABLET
12.5000 mg | ORAL_TABLET | Freq: Two times a day (BID) | ORAL | Status: DC
  Administered 2018-05-14 – 2018-05-15 (×2): 12.5 mg via ORAL
  Filled 2018-05-14 (×2): qty 1

## 2018-05-14 MED ORDER — SODIUM CHLORIDE 0.9 % IV SOLN: 250.0000 mL | INTRAVENOUS | Status: DC | PRN

## 2018-05-14 MED ORDER — CLOPIDOGREL BISULFATE 75 MG PO TABS: 75.0000 mg | ORAL_TABLET | Freq: Every day | ORAL | Status: DC

## 2018-05-14 MED ORDER — BIVALIRUDIN TRIFLUOROACETATE 250 MG IV SOLR
INTRAVENOUS | Status: AC
  Filled 2018-05-14: qty 250

## 2018-05-14 MED ORDER — ASPIRIN 81 MG PO CHEW: 81.0000 mg | CHEWABLE_TABLET | ORAL | Status: DC

## 2018-05-14 MED ORDER — ALBUTEROL SULFATE (2.5 MG/3ML) 0.083% IN NEBU: 2.5000 mg | INHALATION_SOLUTION | Freq: Four times a day (QID) | RESPIRATORY_TRACT | Status: DC | PRN

## 2018-05-14 MED ORDER — VERAPAMIL HCL 2.5 MG/ML IV SOLN
INTRAVENOUS | Status: AC
  Filled 2018-05-14: qty 2

## 2018-05-14 MED ORDER — ONDANSETRON HCL 4 MG/2ML IJ SOLN: 4.0000 mg | Freq: Four times a day (QID) | INTRAMUSCULAR | Status: DC | PRN

## 2018-05-14 MED ORDER — NITROGLYCERIN 0.4 MG SL SUBL: 0.4000 mg | SUBLINGUAL_TABLET | SUBLINGUAL | Status: DC | PRN

## 2018-05-14 MED ORDER — NITROGLYCERIN IN D5W 200-5 MCG/ML-% IV SOLN
0.0000 ug/min | INTRAVENOUS | Status: DC
  Administered 2018-05-14: 5 ug/min via INTRAVENOUS

## 2018-05-14 MED ORDER — SODIUM CHLORIDE 0.9 % WEIGHT BASED INFUSION
3.0000 mL/kg/h | INTRAVENOUS | Status: DC
  Administered 2018-05-14: 3 mL/kg/h via INTRAVENOUS

## 2018-05-14 MED ORDER — PANTOPRAZOLE SODIUM 40 MG PO TBEC
40.0000 mg | DELAYED_RELEASE_TABLET | Freq: Every day | ORAL | Status: DC
  Administered 2018-05-15: 40 mg via ORAL
  Filled 2018-05-14 (×2): qty 1

## 2018-05-14 MED ORDER — HEPARIN SODIUM (PORCINE) 1000 UNIT/ML IJ SOLN
INTRAMUSCULAR | Status: DC | PRN
  Administered 2018-05-14: 4000 [IU] via INTRAVENOUS

## 2018-05-14 MED ORDER — NITROGLYCERIN IN D5W 200-5 MCG/ML-% IV SOLN
INTRAVENOUS | Status: AC
  Filled 2018-05-14: qty 250

## 2018-05-14 MED ORDER — ANGIOPLASTY BOOK
Freq: Once | Status: AC
  Administered 2018-05-14: 23:00:00
  Filled 2018-05-14: qty 1

## 2018-05-14 MED ORDER — LABETALOL HCL 5 MG/ML IV SOLN: 10.0000 mg | INTRAVENOUS | Status: AC | PRN

## 2018-05-14 MED ORDER — NORTRIPTYLINE HCL 25 MG PO CAPS
50.0000 mg | ORAL_CAPSULE | Freq: Every day | ORAL | Status: DC
  Administered 2018-05-14: 50 mg via ORAL
  Filled 2018-05-14: qty 2

## 2018-05-14 MED ORDER — HEPARIN (PORCINE) IN NACL 1000-0.9 UT/500ML-% IV SOLN
INTRAVENOUS | Status: AC
  Filled 2018-05-14: qty 1000

## 2018-05-14 MED ORDER — VERAPAMIL HCL 2.5 MG/ML IV SOLN
INTRAVENOUS | Status: DC | PRN
  Administered 2018-05-14 (×2): 10 mL via INTRA_ARTERIAL

## 2018-05-14 MED ORDER — ACETAMINOPHEN 325 MG PO TABS
650.0000 mg | ORAL_TABLET | ORAL | Status: DC | PRN
  Administered 2018-05-14 – 2018-05-15 (×2): 650 mg via ORAL
  Filled 2018-05-14 (×2): qty 2

## 2018-05-14 MED ORDER — CLOPIDOGREL BISULFATE 75 MG PO TABS
75.0000 mg | ORAL_TABLET | Freq: Every day | ORAL | Status: DC
  Administered 2018-05-15: 10:00:00 75 mg via ORAL
  Filled 2018-05-14: qty 1

## 2018-05-14 MED ORDER — HYDRALAZINE HCL 20 MG/ML IJ SOLN: 5.0000 mg | INTRAMUSCULAR | Status: AC | PRN

## 2018-05-14 MED ORDER — HEPARIN (PORCINE) IN NACL 1000-0.9 UT/500ML-% IV SOLN
INTRAVENOUS | Status: DC | PRN
  Administered 2018-05-14 (×2): 500 mL

## 2018-05-14 MED ORDER — VERAPAMIL HCL 2.5 MG/ML IV SOLN: INTRA_ARTERIAL | Status: DC | PRN

## 2018-05-14 MED ORDER — SODIUM CHLORIDE 0.9% FLUSH
3.0000 mL | Freq: Two times a day (BID) | INTRAVENOUS | Status: DC
  Administered 2018-05-14: 21:00:00 3 mL via INTRAVENOUS

## 2018-05-14 MED ORDER — NITROGLYCERIN IN D5W 200-5 MCG/ML-% IV SOLN
INTRAVENOUS | Status: AC | PRN
  Administered 2018-05-14: 5 ug/min via INTRAVENOUS

## 2018-05-14 SURGICAL SUPPLY — 20 items
BALLN SAPPHIRE 2.0X12 (BALLOONS) ×2
BALLOON SAPPHIRE 2.0X12 (BALLOONS) ×1 IMPLANT
CATH 5FR JL3.5 JR4 ANG PIG MP (CATHETERS) ×2 IMPLANT
CATH MICROCATH NAVVUS (MICROCATHETER) ×1 IMPLANT
CATH OPTITORQUE TIG 4.0 5F (CATHETERS) ×2 IMPLANT
CATH VISTA GUIDE 6FR XBLAD3.5 (CATHETERS) ×2 IMPLANT
DEVICE RAD COMP TR BAND LRG (VASCULAR PRODUCTS) ×2 IMPLANT
GLIDESHEATH SLEND A-KIT 6F 22G (SHEATH) ×2 IMPLANT
GUIDEWIRE INQWIRE 1.5J.035X260 (WIRE) ×1 IMPLANT
INQWIRE 1.5J .035X260CM (WIRE) ×2
KIT ENCORE 26 ADVANTAGE (KITS) ×2 IMPLANT
KIT HEART LEFT (KITS) ×2 IMPLANT
MICROCATHETER NAVVUS (MICROCATHETER) ×2
PACK CARDIAC CATHETERIZATION (CUSTOM PROCEDURE TRAY) ×2 IMPLANT
STENT SYNERGY DES 3X16 (Permanent Stent) ×2 IMPLANT
SYR MEDRAD MARK V 150ML (SYRINGE) ×2 IMPLANT
TRANSDUCER W/STOPCOCK (MISCELLANEOUS) ×2 IMPLANT
TUBING CIL FLEX 10 FLL-RA (TUBING) ×2 IMPLANT
WIRE ASAHI PROWATER 180CM (WIRE) ×4 IMPLANT
WIRE HI TORQ VERSACORE-J 145CM (WIRE) ×2 IMPLANT

## 2018-05-14 NOTE — Progress Notes (Signed)
TR BAND REMOVAL  LOCATION:    right radial  DEFLATED PER PROTOCOL:    Yes.    TIME BAND OFF / DRESSING APPLIED:    2000   SITE UPON ARRIVAL:    Level 0  SITE AFTER BAND REMOVAL:    Level 0  CIRCULATION SENSATION AND MOVEMENT:    Within Normal Limits   Yes.    COMMENTS:   Tolerated procedure well 

## 2018-05-14 NOTE — Progress Notes (Signed)
BP elevated and pt is on NTG drip to control, no orders, will add.

## 2018-05-14 NOTE — Interval H&P Note (Signed)
Cath Lab Visit (complete for each Cath Lab visit)  Clinical Evaluation Leading to the Procedure:   ACS: No.  Non-ACS:    Anginal Classification: CCS II  Anti-ischemic medical therapy: Minimal Therapy (1 class of medications)  Non-Invasive Test Results: No non-invasive testing performed  Prior CABG: No previous CABG      History and Physical Interval Note:  05/14/2018 2:37 PM  [name] [name]  has presented today for surgery, with the diagnosis of cp  The various methods of treatment have been discussed with the patient and family. After consideration of risks, benefits and other options for treatment, the patient has consented to  Procedure(s): LEFT HEART CATH AND CORONARY ANGIOGRAPHY (N/A) as a surgical intervention .  The patient's history has been reviewed, patient examined, no change in status, stable for surgery.  I have reviewed the patient's chart and labs.  Questions were answered to the patient's satisfaction.     [name] [name]

## 2018-05-15 ENCOUNTER — Telehealth: Payer: Self-pay | Admitting: Cardiology

## 2018-05-15 ENCOUNTER — Encounter (HOSPITAL_COMMUNITY): Payer: Self-pay | Admitting: Cardiovascular Disease

## 2018-05-15 DIAGNOSIS — I25110 Atherosclerotic heart disease of native coronary artery with unstable angina pectoris: Secondary | ICD-10-CM

## 2018-05-15 DIAGNOSIS — I25119 Atherosclerotic heart disease of native coronary artery with unspecified angina pectoris: Secondary | ICD-10-CM | POA: Diagnosis not present

## 2018-05-15 DIAGNOSIS — E785 Hyperlipidemia, unspecified: Secondary | ICD-10-CM | POA: Diagnosis not present

## 2018-05-15 DIAGNOSIS — R0602 Shortness of breath: Secondary | ICD-10-CM | POA: Diagnosis not present

## 2018-05-15 DIAGNOSIS — I10 Essential (primary) hypertension: Secondary | ICD-10-CM | POA: Diagnosis not present

## 2018-05-15 LAB — CBC
HEMATOCRIT: 36.3 % (ref 36.0–46.0)
HEMOGLOBIN: 11.9 g/dL — AB (ref 12.0–15.0)
MCH: 28.2 pg (ref 26.0–34.0)
MCHC: 32.8 g/dL (ref 30.0–36.0)
MCV: 86 fL (ref 80.0–100.0)
NRBC: 0 % (ref 0.0–0.2)
Platelets: 273 10*3/uL (ref 150–400)
RBC: 4.22 MIL/uL (ref 3.87–5.11)
RDW: 13.5 % (ref 11.5–15.5)
WBC: 5.3 10*3/uL (ref 4.0–10.5)

## 2018-05-15 LAB — BASIC METABOLIC PANEL
ANION GAP: 6 (ref 5–15)
BUN: 11 mg/dL (ref 8–23)
CHLORIDE: 110 mmol/L (ref 98–111)
CO2: 23 mmol/L (ref 22–32)
Calcium: 9.4 mg/dL (ref 8.9–10.3)
Creatinine, Ser: 0.9 mg/dL (ref 0.44–1.00)
GFR calc non Af Amer: >60 mL/min (ref >60)
Glucose, Bld: 94 mg/dL (ref 70–99)
POTASSIUM: 3.7 mmol/L (ref 3.5–5.1)
SODIUM: 139 mmol/L (ref 135–145)

## 2018-05-15 MED ORDER — PANTOPRAZOLE SODIUM 40 MG PO TBEC: 40.0000 mg | DELAYED_RELEASE_TABLET | Freq: Every day | ORAL | 2 refills | Status: AC

## 2018-05-15 NOTE — Progress Notes (Signed)
CARDIAC REHAB PHASE I   PRE:  Rate/Rhythm: 68 SR  BP:  Supine: 139/71  Sitting:   Standing:    SaO2:   MODE:  Ambulation: 800 ft   POST:  Rate/Rhythm: 92 SR  BP:  Supine:   Sitting: 160/79  Standing:    SaO2:  0755-0845 Pt walked 800 ft on RA with steady gait and tolerated well. No CP. Education completed with pt who voiced understanding. Has been on plavix. Reviewed NTG use, ex ed, gave heart healthy diet, and CRP 2. Referred to [place] program. Attended program before. Pt uncertain if she will do again. Depends on insurance coverage. To recliner after walk. Pt could already tell a difference in her walking tolerance.   [name] Nutting, RN BSN  05/15/2018 8:39 AM

## 2018-05-15 NOTE — Telephone Encounter (Signed)
New Message   Patient has a TOC scheduled for 11/07 with Corine Shelter.

## 2018-05-15 NOTE — Discharge Summary (Addendum)
Discharge Summary    Patient ID: [name] [name],  MRN: [id number], DOB/AGE: [birth year] 72 y.o.  Admit date: 05/14/2018 Discharge date: 05/15/2018  Primary Care Provider: Bernerd Limbo Primary Cardiologist: Dr. Gwenlyn Found   Discharge Diagnoses    Active Problems:   CAD (coronary artery disease)   Essential hypertension   GERD   Dyslipidemia, goal LDL below 70   Allergies Allergies  Allergen Reactions  . Codeine Anaphylaxis  . Garlic Anaphylaxis  . Chocolate     Migraines  . Atorvastatin Other (See Comments)    Muscle aches  . Pravastatin Sodium Other (See Comments)    Muscle pain    Diagnostic Studies/Procedures    Cath: 05/14/18   Prox LAD lesion is 70% stenosed.  Ost 1st Diag lesion is 99% stenosed.  Prox LAD to Mid LAD lesion is 50% stenosed.  A stent was successfully placed.  Post intervention, there is a 0% residual stenosis.  Post intervention, there is a 30% residual stenosis.  The left ventricular systolic function is normal.  LV end diastolic pressure is normal.  The left ventricular ejection fraction is 55-65% by visual estimate.    IMPRESSION: Successful PTCA of the origin of the first diagonal branch jailed by the previous stent and restenting of the previous LAD stent for "in-stent restenosis with a 3 mm x 16 mm long Synergy drug-eluting stent at 16 atm (3.201 mm) with an excellent result.  Patient tolerated procedure well.  She left the lab in stable condition.  She will be gently hydrated overnight.  She will be discharged home in the morning on aspirin Plavix and will see Kerin Ransom, PA-C in the office in 1 week and me back in 3 to 4 weeks.  Quay Burow. MD, [facility] _____________   History of Present Illness     72 y/o female with a history of CAD, s/p NSTEMI and LAD PCI in March 2018. She had a 70% LAD lesion and no other significant CAD. She did well after this. In Sept of this year she had an episode of SSCP associated with  radiation to her jaw and SOB. She says it was similar to her pre PCI symptoms. It came on after she carried her sewing machine and supplies into group sewing event. She was about to contact her PCP but her symptoms spontaneously resolved. Since then she has noted increased DOE. She said sweeping made her profoundly weak and SOB and she has to stop. She denied any rest symptoms. She saw Dr Coletta Memos and he suggested she see Dr Gwenlyn Found. Given her recurrence of symptoms, she was set up for outpatient cardiac cath.   Hospital Course     Underwent cath noted above with successful PTCA of the 1st diag that was jailed by previous stent, along with PCI/DES x1 to the previous LAD stent for ISR. Plan to continue on DAPT with ASA/plavix for at least one year. Morning labs were stable. No chest pain overnight. She was continued on her home medication regimen. On Repatha as an outpatient. Worked well with cardiac rehab. Instructed to stop her Prilosec and switch to Protonix given the need for plavix.   General: Well developed, well nourished, female appearing in no acute distress. Head: Normocephalic, atraumatic.  Neck: Supple without bruits, JVD. Lungs:  Resp regular and unlabored, CTA. Heart: RRR, S1, S2, no S3, S4, or murmur; no rub. Abdomen: Soft, non-tender, non-distended with normoactive bowel sounds. Extremities: No clubbing, cyanosis, edema. Distal pedal pulses are 2+  bilaterally. R radial cath site stable without bruising or hematoma Neuro: Alert and oriented X 3. Moves all extremities spontaneously. Psych: Normal affect.  [name] [name] was seen by Dr. Angelena Form and determined stable for discharge home. Follow up in the office has been arranged. Medications are listed below.   _____________  Discharge Vitals Blood pressure 139/71, pulse 68, temperature (!) 97.5 F (36.4 C), temperature source Oral, resp. rate 18, height '5\' 5"'$  (1.651 m), weight 75.5 kg, SpO2 97 %.  Filed Weights   05/14/18 1236  05/15/18 0626  Weight: 74.8 kg 75.5 kg    Labs & Radiologic Studies    CBC Recent Labs    05/15/18 0627  WBC 5.3  HGB 11.9*  HCT 36.3  MCV 86.0  PLT 193   Basic Metabolic Panel Recent Labs    05/15/18 0627  NA 139  K 3.7  CL 110  CO2 23  GLUCOSE 94  BUN 11  CREATININE 0.90  CALCIUM 9.4   Liver Function Tests No results for input(s): AST, ALT, ALKPHOS, BILITOT, PROT, ALBUMIN in the last 72 hours. No results for input(s): LIPASE, AMYLASE in the last 72 hours. Cardiac Enzymes No results for input(s): CKTOTAL, CKMB, CKMBINDEX, TROPONINI in the last 72 hours. BNP Invalid input(s): POCBNP D-Dimer No results for input(s): DDIMER in the last 72 hours. Hemoglobin A1C No results for input(s): HGBA1C in the last 72 hours. Fasting Lipid Panel No results for input(s): CHOL, HDL, LDLCALC, TRIG, CHOLHDL, LDLDIRECT in the last 72 hours. Thyroid Function Tests No results for input(s): TSH, T4TOTAL, T3FREE, THYROIDAB in the last 72 hours.  Invalid input(s): FREET3 _____________  No results found. Disposition   Pt is being discharged home today in good condition.  Follow-up Plans & Appointments    Follow-up Information    Erlene Quan, PA-C Follow up on 05/24/2018.   Specialties:  Cardiology, Radiology Why:  at 8:30am for your follow up appt.  Contact information: [address] [place] [address] [phone]          Discharge Instructions    AMB Referral to Cardiac Rehabilitation - Phase II   Complete by:  As directed    Diagnosis:  Coronary Stents   Amb Referral to Cardiac Rehabilitation   Complete by:  As directed    Diagnosis:  Coronary Stents   Call MD for:  redness, tenderness, or signs of infection (pain, swelling, redness, odor or green/yellow discharge around incision site)   Complete by:  As directed    Diet - low sodium heart healthy   Complete by:  As directed    Discharge instructions   Complete by:  As directed    Radial  Site Care Refer to this sheet in the next few weeks. These instructions provide you with information on caring for yourself after your procedure. Your caregiver may also give you more specific instructions. Your treatment has been planned according to current medical practices, but problems sometimes occur. Call your caregiver if you have any problems or questions after your procedure. HOME CARE INSTRUCTIONS You may shower the day after the procedure.Remove the bandage (dressing) and gently wash the site with plain soap and water.Gently pat the site dry.  Do not apply powder or lotion to the site.  Do not submerge the affected site in water for 3 to 5 days.  Inspect the site at least twice daily.  Do not flex or bend the affected arm for 24 hours.  No lifting over  5 pounds (2.3 kg) for 5 days after your procedure.  Do not drive home if you are discharged the same day of the procedure. Have someone else drive you.  You may drive 24 hours after the procedure unless otherwise instructed by your caregiver.  What to expect: Any bruising will usually fade within 1 to 2 weeks.  Blood that collects in the tissue (hematoma) may be painful to the touch. It should usually decrease in size and tenderness within 1 to 2 weeks.  SEEK IMMEDIATE MEDICAL CARE IF: You have unusual pain at the radial site.  You have redness, warmth, swelling, or pain at the radial site.  You have drainage (other than a small amount of blood on the dressing).  You have chills.  You have a fever or persistent symptoms for more than 72 hours.  You have a fever and your symptoms suddenly get worse.  Your arm becomes pale, cool, tingly, or numb.  You have heavy bleeding from the site. Hold pressure on the site.   PLEASE DO NOT MISS ANY DOSES OF YOUR PLAVIX!!!!! Also keep a log of you blood pressures and bring back to your follow up appt. Please call the office with any questions.   Patients taking blood thinners should generally  stay away from medicines like ibuprofen, Advil, Motrin, naproxen, and Aleve due to risk of stomach bleeding. You may take Tylenol as directed or talk to your primary doctor about alternatives.  Some studies suggest Prilosec/Omeprazole interacts with Plavix. We changed your Prilosec/Omeprazole to the equivalent dose of Protonix for less chance of interaction.   Increase activity slowly   Complete by:  As directed        Discharge Medications     Medication List    STOP taking these medications   omeprazole 20 MG capsule Commonly known as:  PRILOSEC Replaced by:  pantoprazole 40 MG tablet     TAKE these medications   acetaminophen 500 MG tablet Commonly known as:  TYLENOL Take 500 mg by mouth every 6 (six) hours as needed for headache.   albuterol 108 (90 Base) MCG/ACT inhaler Commonly known as:  PROVENTIL HFA;VENTOLIN HFA Inhale 2 puffs into the lungs 4 (four) times daily as needed for wheezing or shortness of breath.   aspirin EC 81 MG tablet Take 81 mg by mouth daily.   clopidogrel 75 MG tablet Commonly known as:  PLAVIX Take 1 tablet (75 mg total) by mouth daily. What changed:  when to take this   Evolocumab 140 MG/ML Soaj Inject 140 mg into the skin every 14 (fourteen) days.   Glucosamine Chondroitin Triple Tabs Take 1 tablet by mouth 2 (two) times daily.   lisinopril 20 MG tablet Commonly known as:  PRINIVIL,ZESTRIL Take 20 mg by mouth every evening.   metoprolol tartrate 25 MG tablet Commonly known as:  LOPRESSOR Take 0.5 tablets (12.5 mg total) by mouth 2 (two) times daily.   multivitamin with minerals Tabs tablet Take 1 tablet by mouth daily. centrum silver   nitroGLYCERIN 0.4 MG SL tablet Commonly known as:  NITROSTAT Place 1 tablet (0.4 mg total) under the tongue every 5 (five) minutes x 3 doses as needed.   nortriptyline 50 MG capsule Commonly known as:  PAMELOR Take 50-100 mg by mouth at bedtime.   pantoprazole 40 MG tablet Commonly known as:   PROTONIX Take 1 tablet (40 mg total) by mouth daily. Replaces:  omeprazole 20 MG capsule   SYSTANE OP Place 1 drop into  both eyes at bedtime.   tiZANidine 4 MG tablet Commonly known as:  ZANAFLEX Take 4 mg by mouth 2 (two) times daily as needed (migraine related muscle spasms).   Vitamin D (Ergocalciferol) 50000 units Caps capsule Commonly known as:  DRISDOL Take 50,000 Units by mouth every Monday.       Acute coronary syndrome (MI, NSTEMI, STEMI, etc) this admission?: No.     Outstanding Labs/Studies   N/a   Duration of Discharge Encounter   Greater than 30 minutes including physician time.  Signed, Reino Bellis NP-C 05/15/2018, 9:18 AM   I have personally seen and examined this patient. I agree with the assessment and plan as outlined above.  Post PCI and stenting of the LAD yesterday with a DES, POBA of the Diagonal branch. Doing well this am. No chest pain. Will continue ASA and Plavix and discharge home today.   Lauree Chandler 05/15/2018 9:37 AM

## 2018-05-15 NOTE — Telephone Encounter (Signed)
Currently admitted.

## 2018-05-16 NOTE — Telephone Encounter (Signed)
Patient contacted regarding discharge from [facility] hospital 05/15/18  Patient understands to follow up with Corine Shelter 05/23/18 at 10:00 am. Patient understands discharge instructions. Patient understands medications and regiment. Patient understands to bring all medications to this visit.  Patient stated she is feeling good this morning,no complaints.

## 2018-05-21 ENCOUNTER — Telehealth (HOSPITAL_COMMUNITY): Payer: Self-pay

## 2018-05-21 NOTE — Telephone Encounter (Signed)
Pt insurance is active and benefits verified through [facility]. Co-pay $20.00, DED $0.00/$0.00 met, out of pocket $4,400.00/$708.66 met, co-insurance $0.00. No pre-authorization. Passport, 05/21/18 @ 9:06AM, REF# [id number]

## 2018-05-21 NOTE — Telephone Encounter (Signed)
Called patient to see if she was interested in participating in the Cardiac Rehab Program. Patient stated that it will be too much with her insurance. Adv pt of our CR maintenance program and she stated she would like to have some time to think about it.  Will follow up with her in a week.

## 2018-05-23 ENCOUNTER — Ambulatory Visit: Payer: Medicare Other | Admitting: Cardiology

## 2018-05-23 ENCOUNTER — Encounter: Payer: Self-pay | Admitting: Cardiology

## 2018-05-23 VITALS — BP 134/78 | HR 82 | Resp 16 | Ht 65.0 in | Wt 169.0 lb

## 2018-05-23 DIAGNOSIS — I10 Essential (primary) hypertension: Secondary | ICD-10-CM | POA: Diagnosis not present

## 2018-05-23 DIAGNOSIS — E785 Hyperlipidemia, unspecified: Secondary | ICD-10-CM | POA: Diagnosis not present

## 2018-05-23 DIAGNOSIS — Z9861 Coronary angioplasty status: Secondary | ICD-10-CM

## 2018-05-23 DIAGNOSIS — I2510 Atherosclerotic heart disease of native coronary artery without angina pectoris: Secondary | ICD-10-CM

## 2018-05-23 NOTE — Patient Instructions (Signed)
Medication Instructions:  Your physician recommends that you continue on your current medications as directed. Please refer to the Current Medication list given to you today.  If you need a refill on your cardiac medications before your next appointment, please call your pharmacy.   Lab work: None  If you have labs (blood work) drawn today and your tests are completely normal, you will receive your results only by: Marland Kitchen MyChart Message (if you have MyChart) OR . A paper copy in the mail If you have any lab test that is abnormal or we need to change your treatment, we will call you to review the results.  Testing/Procedures: None   Follow-Up: At [facility], you and your health needs are our priority.  As part of our continuing mission to provide you with exceptional heart care, we have created designated Provider Care Teams.  These Care Teams include your primary Cardiologist (physician) and Advanced Practice Providers (APPs -  Physician Assistants and Nurse Practitioners) who all work together to provide you with the care you need, when you need it. Follow up with Dr Allyson Sabal as scheduled   Any Other Special Instructions Will Be Listed Below (If Applicable).

## 2018-05-23 NOTE — Progress Notes (Signed)
05/23/2018 [name] [name]   October 01, 1945  [id number]  Primary Physician Tracey Harries, MD Primary Cardiologist: Dr Allyson Sabal  HPI:  Pleasant 72 y/o female with a history of CAD, s/p NSTEMI and LAD PCI in March 2018 by Dr Sharyn Lull. She had a 70% LAD lesion and no other significant CAD. She did well after this. In Sept of this year she had an episode of SSCP associated with radiation to her jaw and SOB. She says it was similar to her pre PCI symptoms. It came on after she carried her sewing machine and supplies into group sewing event. She was about to contact her PCP but her symptoms spontaneously resolved. After that she noted increased DOE. She saw Dr Everlene Other who suggested she see Dr Allyson Sabal. I saw her in the office  05/10/18.  She told me that sweeping made her profoundly weak and SOB. She was set up for OP cath which was done 05/14/18.  This revealed in stent restenosis of the previously placed LAD stent as well as a jailed Dx1.  She underwent PCI to the LAD with DES and PTCA of the Dx1.  Dr Allyson Sabal notes an excellent post PCI result.  The pt does have a residual 50% mLAD narrowing.  She is in the office today for follow up.  She tells me she feels much better, no unusual DOE.  I reviewed her labs, her LDL was 31.  Repeat B/P by me was 124/72.  I reassured her that she should be fine and encouraged her to ease back into her usual activities.      Current Outpatient Medications  Medication Sig Dispense Refill  . acetaminophen (TYLENOL) 500 MG tablet Take 500 mg by mouth every 6 (six) hours as needed for headache.     . albuterol (PROVENTIL HFA;VENTOLIN HFA) 108 (90 Base) MCG/ACT inhaler Inhale 2 puffs into the lungs 4 (four) times daily as needed for wheezing or shortness of breath.     Marland Kitchen aspirin EC 81 MG tablet Take 81 mg by mouth daily.    . clopidogrel (PLAVIX) 75 MG tablet Take 1 tablet (75 mg total) by mouth daily. (Patient taking differently: Take 75 mg by mouth every evening. ) 90 tablet 3  .  Evolocumab (REPATHA SURECLICK) 140 MG/ML SOAJ Inject 140 mg into the skin every 14 (fourteen) days. 2 pen 12  . lisinopril (PRINIVIL,ZESTRIL) 20 MG tablet Take 20 mg by mouth every evening.    . metoprolol tartrate (LOPRESSOR) 25 MG tablet Take 0.5 tablets (12.5 mg total) by mouth 2 (two) times daily. 90 tablet 2  . Misc Natural Products (GLUCOSAMINE CHONDROITIN TRIPLE) TABS Take 1 tablet by mouth 2 (two) times daily.    . Multiple Vitamin (MULTIVITAMIN WITH MINERALS) TABS tablet Take 1 tablet by mouth daily. centrum silver    . nitroGLYCERIN (NITROSTAT) 0.4 MG SL tablet Place 1 tablet (0.4 mg total) under the tongue every 5 (five) minutes x 3 doses as needed. 75 tablet 2  . nortriptyline (PAMELOR) 50 MG capsule Take 50-100 mg by mouth at bedtime.    . pantoprazole (PROTONIX) 40 MG tablet Take 1 tablet (40 mg total) by mouth daily. 30 tablet 2  . Polyethyl Glycol-Propyl Glycol (SYSTANE OP) Place 1 drop into both eyes at bedtime.     Marland Kitchen tiZANidine (ZANAFLEX) 4 MG tablet Take 4 mg by mouth 2 (two) times daily as needed (migraine related muscle spasms).    . Vitamin D, Ergocalciferol, (DRISDOL) 50000 units CAPS capsule Take  50,000 Units by mouth every Monday.      No current facility-administered medications for this visit.     Allergies  Allergen Reactions  . Codeine Anaphylaxis  . Garlic Anaphylaxis  . Chocolate     Migraines  . Atorvastatin Other (See Comments)    Muscle aches  . Pravastatin Sodium Other (See Comments)    Muscle pain    Past Medical History:  Diagnosis Date  . Arthritis    "knees, hips, hands, back" (09/15/2016)  . Constipation   . Coronary artery disease    NSTEMI-PCI March 2018  . Exercise-induced asthma   . Family history of adverse reaction to anesthesia    "daughter gets PONV too"  . GERD (gastroesophageal reflux disease)   . Heart murmur dx'd 09/14/2016  . High cholesterol   . Hypertension   . Migraine    "recently had series of injections; none since;  they had been weekly 1 month ago" (09/15/2016)  . Necrotizing fasciitis (HCC)    R index finger- had a amp  . NSTEMI (non-ST elevated myocardial infarction) (HCC)   . Pneumonia ~ 2003  . PONV (postoperative nausea and vomiting)    Patch helps  . RSD (reflex sympathetic dystrophy)    Right arm after surgery    Social History   Socioeconomic History  . Marital status: Married    Spouse name: Not on file  . Number of children: Not on file  . Years of education: Not on file  . Highest education level: Not on file  Occupational History  . Not on file  Social Needs  . Financial resource strain: Not on file  . Food insecurity:    Worry: Not on file    Inability: Not on file  . Transportation needs:    Medical: Not on file    Non-medical: Not on file  Tobacco Use  . Smoking status: Never Smoker  . Smokeless tobacco: Never Used  Substance and Sexual Activity  . Alcohol use: No  . Drug use: No  . Sexual activity: Not on file  Lifestyle  . Physical activity:    Days per week: Not on file    Minutes per session: Not on file  . Stress: Not on file  Relationships  . Social connections:    Talks on phone: Not on file    Gets together: Not on file    Attends religious service: Not on file    Active member of club or organization: Not on file    Attends meetings of clubs or organizations: Not on file    Relationship status: Not on file  . Intimate partner violence:    Fear of current or ex partner: Not on file    Emotionally abused: Not on file    Physically abused: Not on file    Forced sexual activity: Not on file  Other Topics Concern  . Not on file  Social History Narrative  . Not on file     Family History  Problem Relation Age of Onset  . Heart failure Mother   . Cancer Mother 71       Breast  . Kidney failure Father   . Brain cancer Sister      Review of Systems: General: negative for chills, fever, night sweats or weight changes.  Cardiovascular: negative  for chest pain, dyspnea on exertion, edema, orthopnea, palpitations, paroxysmal nocturnal dyspnea or shortness of breath Dermatological: negative for rash Respiratory: negative for cough or wheezing Urologic:  negative for hematuria Abdominal: negative for nausea, vomiting, diarrhea, bright red blood per rectum, melena, or hematemesis Neurologic: negative for visual changes, syncope, or dizziness All other systems reviewed and are otherwise negative except as noted above.    Blood pressure 134/78, pulse 82, resp. rate 16, height 5\' 5"  (1.651 m), weight 169 lb (76.7 kg), SpO2 97 %.  General appearance: alert, cooperative and no distress Neck: no carotid bruit and no JVD Lungs: clear to auscultation bilaterally Heart: regular rate and rhythm Extremities: re radial site without hematoma or ecchymosis. Lr forarm ecchymotic from an IV site Skin: Skin color, texture, turgor normal. No rashes or lesions Neurologic: Grossly normal  EKG NSR  ASSESSMENT AND PLAN:   CAD S/P PCI LAD PCI with DES by Dr Sharyn Lull  09/15/16. 70% LAD then with no other significant CAD and normal LVF.  ISR 05/14/18 of the LAD treated with PCI/ DES and PTCA of previously jailed Dx1   Essential hypertension Controlled- repeat B/P by me 124/72  Dyslipidemia, goal LDL below 70 LDL 31 on Repatha   PLAN  Same Rx. Keep f/u with Dr Allyson Sabal in Jan.   Corine Shelter PA-C 05/23/2018 10:34 AM

## 2018-05-23 NOTE — Assessment & Plan Note (Signed)
LDL 31 on Repatha

## 2018-05-23 NOTE — Assessment & Plan Note (Signed)
Controlled- repeat B/P by me 124/72

## 2018-05-23 NOTE — Assessment & Plan Note (Signed)
LAD PCI with DES by Dr Sharyn Lull  09/15/16. 70% LAD then with no other significant CAD and normal LVF.  ISR 05/14/18 of the LAD treated with PCI/ DES and PTCA of previously jailed Dx1

## 2018-05-24 ENCOUNTER — Ambulatory Visit: Payer: Medicare Other | Admitting: Cardiology

## 2018-05-28 ENCOUNTER — Telehealth (HOSPITAL_COMMUNITY): Payer: Self-pay

## 2018-05-28 NOTE — Telephone Encounter (Signed)
Called to follow up with patient in regards to Cardiac Rehab - Pt stated she is going to try and get going at the gym but if she cannot commit herself to the gym then she is going to participate in Rehab here. Pt would like a call in 2-3 weeks.

## 2018-06-18 NOTE — Telephone Encounter (Signed)
Called patient to see if she was interested in participating in the Cardiac Rehab Program. Patient stated she would like to join the CR Maintenance program. Will pass referral to Cardiac Rehab Maintenance Coord.  Closed referral

## 2018-07-27 ENCOUNTER — Ambulatory Visit: Payer: Medicare Other | Admitting: Cardiovascular Disease

## 2018-07-27 ENCOUNTER — Encounter: Payer: Self-pay | Admitting: Cardiovascular Disease

## 2018-07-27 DIAGNOSIS — Z9861 Coronary angioplasty status: Secondary | ICD-10-CM

## 2018-07-27 DIAGNOSIS — I10 Essential (primary) hypertension: Secondary | ICD-10-CM

## 2018-07-27 DIAGNOSIS — I2510 Atherosclerotic heart disease of native coronary artery without angina pectoris: Secondary | ICD-10-CM

## 2018-07-27 DIAGNOSIS — E785 Hyperlipidemia, unspecified: Secondary | ICD-10-CM | POA: Diagnosis not present

## 2018-07-27 NOTE — Assessment & Plan Note (Signed)
History of hyperlipidemia on Repatha with lipid profile performed 01/08/2018 revealing total cholesterol 113, LDL 31 and an HDL of 54.

## 2018-07-27 NOTE — Assessment & Plan Note (Signed)
History of PCI and stenting of her LAD by Dr. Sharyn Lull in the setting of a non-STEMI 18.  Recath her ischemic symptoms revealing "in-stent restenosis which I restented jailing of a small first diagonal branch.  She did have a 50% mid LAD stenosis beyond the stented segment which I did not think was significant with normal LV function.  She remains on aspirin Plavix.  She denies chest pain and feels clinically improved.

## 2018-07-27 NOTE — Assessment & Plan Note (Signed)
History of essential hypertension blood pressure measured today 132/84.  She is on Toprol all and lisinopril.  Continue current meds at current dosing.

## 2018-07-27 NOTE — Progress Notes (Signed)
07/27/2018 [name] HatchJanice P Averhart   [birth year]  [id number]  Primary Physician Tracey HarriesBouska, David, MD Primary Cardiologist: Runell GessJonathan J  MD Milagros LollFACP, FACC, [place], [place]  HPI:  [name] [name] is a 73 y.o. mildly overweight married Caucasian female mother of 3, grandmother of 7111 children Actually saw after being referred by Dr. Johna SheriffBruce Scott.  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  She is never smoked.  Her mother did have a myocardial infarction.  She has hypertension hyperlipidemia.  She had a non-STEMI March 2018 and LAD intervention but Dr. Sharyn LullHarwani.  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  She did have a jailed first diagonal branch which was small and a 50% mid LAD beyond the stented segment which I did not think was significant.  She was clinically improved after this and denies chest pain or shortness of breath.   Current Meds  Medication Sig  . acetaminophen (TYLENOL) 500 MG tablet Take 500 mg by mouth every 6 (six) hours as needed for headache.   . albuterol (PROVENTIL HFA;VENTOLIN HFA) 108 (90 Base) MCG/ACT inhaler Inhale 2 puffs into the lungs 4 (four) times daily as needed for wheezing or shortness of breath.   Marland Kitchen. aspirin EC 81 MG tablet Take 81 mg by mouth daily.  . clopidogrel (PLAVIX) 75 MG tablet Take 1 tablet (75 mg total) by mouth daily. (Patient taking differently: Take 75 mg by mouth every evening. )  . Evolocumab (REPATHA SURECLICK) 140 MG/ML SOAJ Inject 140 mg into the skin every 14 (fourteen) days.  Marland Kitchen. lisinopril (PRINIVIL,ZESTRIL) 20 MG tablet Take 20 mg by mouth every evening.  . metoprolol tartrate (LOPRESSOR) 25 MG tablet Take 0.5 tablets (12.5 mg total) by mouth 2 (two) times daily.  . Misc Natural Products (GLUCOSAMINE CHONDROITIN TRIPLE) TABS Take 1 tablet by mouth 2 (two) times daily.  . Multiple Vitamin (MULTIVITAMIN WITH MINERALS) TABS tablet Take 1 tablet by mouth daily. centrum silver  .  nitroGLYCERIN (NITROSTAT) 0.4 MG SL tablet Place 1 tablet (0.4 mg total) under the tongue every 5 (five) minutes x 3 doses as needed.  . nortriptyline (PAMELOR) 50 MG capsule Take 50-100 mg by mouth at bedtime.  . pantoprazole (PROTONIX) 40 MG tablet Take 1 tablet (40 mg total) by mouth daily.  Bertram Gala. Polyethyl Glycol-Propyl Glycol (SYSTANE OP) Place 1 drop into both eyes at bedtime.   Marland Kitchen. tiZANidine (ZANAFLEX) 4 MG tablet Take 4 mg by mouth 2 (two) times daily as needed (migraine related muscle spasms).     Allergies  Allergen Reactions  . Codeine Anaphylaxis  . Garlic Anaphylaxis  . Chocolate     Migraines  . Atorvastatin Other (See Comments)    Muscle aches  . Pravastatin Sodium Other (See Comments)    Muscle pain    Social History   Socioeconomic History  . Marital status: Married    Spouse name: Not on file  . Number of children: Not on file  . Years of education: Not on file  . Highest education level: Not on file  Occupational History  . Not on file  Social Needs  . Financial resource strain: Not on file  . Food insecurity:    Worry: Not on file    Inability: Not on file  . Transportation needs:    Medical: Not on file    Non-medical: Not on file  Tobacco Use  . Smoking status: Never Smoker  . Smokeless tobacco:  Never Used  Substance and Sexual Activity  . Alcohol use: No  . Drug use: No  . Sexual activity: Not on file  Lifestyle  . Physical activity:    Days per week: Not on file    Minutes per session: Not on file  . Stress: Not on file  Relationships  . Social connections:    Talks on phone: Not on file    Gets together: Not on file    Attends religious service: Not on file    Active member of club or organization: Not on file    Attends meetings of clubs or organizations: Not on file    Relationship status: Not on file  . Intimate partner violence:    Fear of current or ex partner: Not on file    Emotionally abused: Not on file    Physically abused: Not  on file    Forced sexual activity: Not on file  Other Topics Concern  . Not on file  Social History Narrative  . Not on file     Review of Systems: General: negative for chills, fever, night sweats or weight changes.  Cardiovascular: negative for chest pain, dyspnea on exertion, edema, orthopnea, palpitations, paroxysmal nocturnal dyspnea or shortness of breath Dermatological: negative for rash Respiratory: negative for cough or wheezing Urologic: negative for hematuria Abdominal: negative for nausea, vomiting, diarrhea, bright red blood per rectum, melena, or hematemesis Neurologic: negative for visual changes, syncope, or dizziness All other systems reviewed and are otherwise negative except as noted above.    Blood pressure 132/84, pulse 78, height 5\' 5"  (1.651 m), weight 172 lb 9.6 oz (78.3 kg), SpO2 96 %.  General appearance: alert and no distress Neck: no adenopathy, no carotid bruit, no JVD, supple, symmetrical, trachea midline and thyroid not enlarged, symmetric, no tenderness/mass/nodules Lungs: clear to auscultation bilaterally Heart: regular rate and rhythm, S1, S2 normal, no murmur, click, rub or gallop Extremities: extremities normal, atraumatic, no cyanosis or edema Pulses: 2+ and symmetric Skin: Skin color, texture, turgor normal. No rashes or lesions Neurologic: Alert and oriented X 3, normal strength and tone. Normal symmetric reflexes. Normal coordination and gait  EKG not performed today  ASSESSMENT AND PLAN:   Essential hypertension History of essential hypertension blood pressure measured today 132/84.  She is on Toprol all and lisinopril.  Continue current meds at current dosing.  CAD S/P PCI History of PCI and stenting of her LAD by Dr. Sharyn LullHarwani in the setting of a non-STEMI 18.  Recath her ischemic symptoms revealing "in-stent restenosis which I restented jailing of a small first diagonal branch.  She did have a 50% mid LAD stenosis beyond the stented  segment which I did not think was significant with normal LV function.  She remains on aspirin Plavix.  She denies chest pain and feels clinically improved.  Dyslipidemia, goal LDL below 70 History of hyperlipidemia on Repatha with lipid profile performed 01/08/2018 revealing total cholesterol 113, LDL 31 and an HDL of 54.      Runell GessJonathan J.  MD FACP,FACC,FAHA, [facility]SCAI 07/27/2018 9:53 AM

## 2018-07-27 NOTE — Patient Instructions (Signed)
Medication Instructions:  NONE If you need a refill on your cardiac medications before your next appointment, please call your pharmacy.   Lab work: NONE If you have labs (blood work) drawn today and your tests are completely normal, you will receive your results only by: Marland Kitchen MyChart Message (if you have MyChart) OR . A paper copy in the mail If you have any lab test that is abnormal or we need to change your treatment, we will call you to review the results.  Testing/Procedures: NONE  Follow-Up: At [facility], you and your health needs are our priority.  As part of our continuing mission to provide you with exceptional heart care, we have created designated Provider Care Teams.  These Care Teams include your primary Cardiologist (physician) and Advanced Practice Providers (APPs -  Physician Assistants and Nurse Practitioners) who all work together to provide you with the care you need, when you need it. . You will need a follow up appointment in 6 months with an APP and 12 months with Dr. Allyson Sabal.  Please call our office 2 months in advance to schedule each appointment.  You may see one of the following Advanced Practice Providers on your designated Care Team:   . Corine Shelter, [place] . Azalee Course, PA-C . Micah Flesher, PA-C . Joni Reining, DNP . Theodore Demark, PA-C . Judy Pimple, PA-C . Marjie Skiff, PA-C

## 2018-07-30 ENCOUNTER — Encounter (HOSPITAL_COMMUNITY)
Admission: RE | Admit: 2018-07-30 | Discharge: 2018-07-30 | Disposition: A | Discharge status: Home / Self Care | Payer: Self-pay | Source: Ambulatory Visit | Attending: Cardiovascular Disease | Admitting: Cardiovascular Disease

## 2018-07-30 DIAGNOSIS — Z955 Presence of coronary angioplasty implant and graft: Secondary | ICD-10-CM | POA: Insufficient documentation

## 2018-08-01 ENCOUNTER — Encounter (HOSPITAL_COMMUNITY): Payer: Self-pay

## 2018-08-03 ENCOUNTER — Encounter (HOSPITAL_COMMUNITY): Payer: Self-pay

## 2018-08-06 ENCOUNTER — Encounter (HOSPITAL_COMMUNITY): Payer: Self-pay

## 2018-08-08 ENCOUNTER — Telehealth (HOSPITAL_COMMUNITY): Payer: Self-pay | Admitting: *Deleted

## 2018-08-08 ENCOUNTER — Encounter (HOSPITAL_COMMUNITY): Payer: Self-pay

## 2018-08-10 ENCOUNTER — Encounter (HOSPITAL_COMMUNITY): Payer: Self-pay

## 2018-08-13 ENCOUNTER — Encounter (HOSPITAL_COMMUNITY): Payer: Self-pay

## 2018-08-15 ENCOUNTER — Encounter (HOSPITAL_COMMUNITY): Payer: Self-pay

## 2018-08-17 ENCOUNTER — Encounter (HOSPITAL_COMMUNITY): Payer: Self-pay

## 2018-08-20 ENCOUNTER — Encounter (HOSPITAL_COMMUNITY): Payer: Self-pay

## 2018-08-22 ENCOUNTER — Encounter (HOSPITAL_COMMUNITY): Payer: Self-pay

## 2018-08-24 ENCOUNTER — Encounter (HOSPITAL_COMMUNITY): Payer: Self-pay

## 2018-08-27 ENCOUNTER — Encounter (HOSPITAL_COMMUNITY): Payer: Self-pay

## 2018-08-29 ENCOUNTER — Encounter (HOSPITAL_COMMUNITY): Payer: Self-pay

## 2018-08-31 ENCOUNTER — Encounter (HOSPITAL_COMMUNITY): Payer: Self-pay

## 2018-09-03 ENCOUNTER — Encounter (HOSPITAL_COMMUNITY): Payer: Self-pay

## 2018-09-05 ENCOUNTER — Encounter (HOSPITAL_COMMUNITY): Payer: Self-pay

## 2018-09-07 ENCOUNTER — Encounter (HOSPITAL_COMMUNITY): Payer: Self-pay

## 2018-09-10 ENCOUNTER — Encounter (HOSPITAL_COMMUNITY): Payer: Self-pay

## 2018-09-11 ENCOUNTER — Telehealth: Payer: Self-pay | Admitting: Cardiovascular Disease

## 2018-09-11 NOTE — Telephone Encounter (Signed)
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   Pharmacy wants to  a)clarify whether or not dr wants her to be on a statin b) if she is supposed to be on it, clarify whether or not she is getting medication filled  The pharmacy also wants to know if the pt have a documented allergy pharmacy is not aware of , which could lead to the Medicare notices

## 2018-09-12 ENCOUNTER — Encounter (HOSPITAL_COMMUNITY): Payer: Self-pay

## 2018-09-14 ENCOUNTER — Encounter (HOSPITAL_COMMUNITY): Payer: Self-pay

## 2018-09-17 ENCOUNTER — Encounter (HOSPITAL_COMMUNITY): Payer: Self-pay

## 2018-09-19 ENCOUNTER — Encounter (HOSPITAL_COMMUNITY): Payer: Self-pay

## 2018-09-21 ENCOUNTER — Encounter (HOSPITAL_COMMUNITY): Payer: Self-pay

## 2018-09-21 NOTE — Telephone Encounter (Signed)
Patient is statin intolerant

## 2018-09-24 ENCOUNTER — Encounter (HOSPITAL_COMMUNITY): Payer: Self-pay

## 2018-09-26 ENCOUNTER — Encounter (HOSPITAL_COMMUNITY): Payer: Self-pay

## 2018-09-28 ENCOUNTER — Encounter (HOSPITAL_COMMUNITY): Payer: Self-pay

## 2018-10-01 ENCOUNTER — Encounter (HOSPITAL_COMMUNITY): Payer: Self-pay

## 2018-10-03 ENCOUNTER — Encounter (HOSPITAL_COMMUNITY): Payer: Self-pay

## 2018-10-05 ENCOUNTER — Encounter (HOSPITAL_COMMUNITY): Payer: Self-pay

## 2018-10-08 ENCOUNTER — Encounter (HOSPITAL_COMMUNITY): Payer: Self-pay

## 2018-10-10 ENCOUNTER — Encounter (HOSPITAL_COMMUNITY): Payer: Self-pay

## 2018-10-12 ENCOUNTER — Encounter (HOSPITAL_COMMUNITY): Payer: Self-pay

## 2018-10-15 ENCOUNTER — Encounter (HOSPITAL_COMMUNITY): Payer: Self-pay

## 2018-10-15 ENCOUNTER — Other Ambulatory Visit: Payer: Self-pay | Admitting: Cardiovascular Disease

## 2018-10-17 ENCOUNTER — Encounter (HOSPITAL_COMMUNITY): Payer: Self-pay

## 2018-10-19 ENCOUNTER — Encounter (HOSPITAL_COMMUNITY): Payer: Self-pay

## 2018-10-22 ENCOUNTER — Encounter (HOSPITAL_COMMUNITY): Payer: Self-pay

## 2018-10-24 ENCOUNTER — Encounter (HOSPITAL_COMMUNITY): Payer: Self-pay

## 2018-10-26 ENCOUNTER — Encounter (HOSPITAL_COMMUNITY): Payer: Self-pay

## 2018-10-29 ENCOUNTER — Encounter (HOSPITAL_COMMUNITY): Payer: Self-pay

## 2018-10-31 ENCOUNTER — Encounter (HOSPITAL_COMMUNITY): Payer: Self-pay

## 2018-11-02 ENCOUNTER — Encounter (HOSPITAL_COMMUNITY): Payer: Self-pay

## 2018-11-05 ENCOUNTER — Encounter (HOSPITAL_COMMUNITY): Payer: Self-pay

## 2018-11-07 ENCOUNTER — Encounter (HOSPITAL_COMMUNITY): Payer: Self-pay

## 2018-11-07 ENCOUNTER — Telehealth: Payer: Self-pay | Admitting: Cardiovascular Disease

## 2018-11-07 ENCOUNTER — Telehealth: Payer: Self-pay | Admitting: *Deleted

## 2018-11-07 NOTE — Telephone Encounter (Signed)
Called patient to review her chart and confirm her appointment with Dr. Allyson Sabal on November 09, 2018. She told me to call back later or tomorrow.

## 2018-11-07 NOTE — Telephone Encounter (Signed)
Mychart declined, smartphone, pre reg complete 11/07/18 AF

## 2018-11-08 ENCOUNTER — Telehealth: Payer: Self-pay | Admitting: *Deleted

## 2018-11-08 ENCOUNTER — Encounter: Payer: Self-pay | Admitting: *Deleted

## 2018-11-08 NOTE — Telephone Encounter (Signed)
Virtual Visit Pre-Appointment Phone Call  "(Name), I am calling you today to discuss your upcoming appointment. We are currently trying to limit exposure to the virus that causes COVID-19 by seeing patients at home rather than in the office."  1. "What is the BEST phone number to call the day of the visit?" - include this in appointment notes  2. Do you have or have access to (through a family member/friend) a smartphone with video capability that we can use for your visit?" a. If yes - list this number in appt notes as cell (if different from BEST phone #) and list the appointment type as a VIDEO visit in appointment notes b. If no - list the appointment type as a PHONE visit in appointment notes  3. Confirm consent - "In the setting of the current Covid19 crisis, you are scheduled for a (phone or video) visit with your provider on (date) at (time).  Just as we do with many in-office visits, in order for you to participate in this visit, we must obtain consent.  If you'd like, I can send this to your mychart (if signed up) or email for you to review.  Otherwise, I can obtain your verbal consent now.  All virtual visits are billed to your insurance company just like a normal visit would be.  By agreeing to a virtual visit, we'd like you to understand that the technology does not allow for your provider to perform an examination, and thus may limit your provider's ability to fully assess your condition. If your provider identifies any concerns that need to be evaluated in person, we will make arrangements to do so.  Finally, though the technology is pretty good, we cannot assure that it will always work on either your or our end, and in the setting of a video visit, we may have to convert it to a phone-only visit.  In either situation, we cannot ensure that we have a secure connection.  Are you willing to proceed?" STAFF: Did the patient verbally acknowledge consent to telehealth visit? Document  YES/NO here: YES  4. Advise patient to be prepared - "Two hours prior to your appointment, go ahead and check your blood pressure, pulse, oxygen saturation, and your weight (if you have the equipment to check those) and write them all down. When your visit starts, your provider will ask you for this information. If you have an Apple Watch or Kardia device, please plan to have heart rate information ready on the day of your appointment. Please have a pen and paper handy nearby the day of the visit as well."  5. Give patient instructions for MyChart download to smartphone OR [email or web address].me as below if video visit (depending on what platform provider is using)  6. Inform patient they will receive a phone call 15 minutes prior to their appointment time (may be from unknown caller ID) so they should be prepared to answer    TELEPHONE CALL NOTE  IMOGEAN PICHETTE has been deemed a candidate for a follow-up tele-health visit to limit community exposure during the Covid-19 pandemic. I spoke with the patient via phone to ensure availability of phone/video source, confirm preferred email & phone number, and discuss instructions and expectations.  I reminded TAYCEE SNELLING to be prepared with any vital sign and/or heart rhythm information that could potentially be obtained via home monitoring, at the time of her visit. I reminded LARITZA SONORA to expect a phone call prior to  her visit.  Raelyn NumberWilliamson, Achaia Garlock L, CMA 11/08/2018 1:32 PM   INSTRUCTIONS FOR DOWNLOADING THE MYCHART APP TO SMARTPHONE  - The patient must first make sure to have activated MyChart and know their login information - If Apple, go to [employer] Store and type in MyChart in the search bar and download the app. If Android, ask patient to go to [employer] Play Store and type in [facility]Chart in the search bar and download the app. The app is free but as with any other app downloads, their phone may require them to verify saved payment information or  Apple/Android password.  - The patient will need to then log into the app with their MyChart username and password, and select [facility] as their healthcare provider to link the account. When it is time for your visit, go to the MyChart app, find appointments, and click Begin Video Visit. Be sure to Select Allow for your device to access the Microphone and Camera for your visit. You will then be connected, and your provider will be with you shortly.  **If they have any issues connecting, or need assistance please contact MyChart service desk [phone])**  **If using a computer, in order to ensure the best quality for their visit they will need to use either of the following Internet Browsers: [employer] Edge, or Google Chrome**  IF USING DOXIMITY or DOXY.ME - The patient will receive a link just prior to their visit by text.     FULL LENGTH CONSENT FOR TELE-HEALTH VISIT   I hereby voluntarily request, consent and authorize [facility] and its employed or contracted physicians, physician assistants, nurse practitioners or other licensed health care professionals (the Practitioner), to provide me with telemedicine health care services (the Services") as deemed necessary by the treating Practitioner. I acknowledge and consent to receive the Services by the Practitioner via telemedicine. I understand that the telemedicine visit will involve communicating with the Practitioner through live audiovisual communication technology and the disclosure of certain medical information by electronic transmission. I acknowledge that I have been given the opportunity to request an in-person assessment or other available alternative prior to the telemedicine visit and am voluntarily participating in the telemedicine visit.  I understand that I have the right to withhold or withdraw my consent to the use of telemedicine in the course of my care at any time, without affecting my right to future care  or treatment, and that the Practitioner or I may terminate the telemedicine visit at any time. I understand that I have the right to inspect all information obtained and/or recorded in the course of the telemedicine visit and may receive copies of available information for a reasonable fee.  I understand that some of the potential risks of receiving the Services via telemedicine include:   Delay or interruption in medical evaluation due to technological equipment failure or disruption;  Information transmitted may not be sufficient (e.g. poor resolution of images) to allow for appropriate medical decision making by the Practitioner; and/or   In rare instances, security protocols could fail, causing a breach of personal health information.  Furthermore, I acknowledge that it is my responsibility to provide information about my medical history, conditions and care that is complete and accurate to the best of my ability. I acknowledge that Practitioner's advice, recommendations, and/or decision may be based on factors not within their control, such as incomplete or inaccurate data provided by me or distortions of diagnostic images or specimens that may result from electronic transmissions.  I understand that the practice of medicine is not an exact science and that Practitioner makes no warranties or guarantees regarding treatment outcomes. I acknowledge that I will receive a copy of this consent concurrently upon execution via email to the email address I last provided but may also request a printed copy by calling the office of [facility].    I understand that my insurance will be billed for this visit.   I have read or had this consent read to me.  I understand the contents of this consent, which adequately explains the benefits and risks of the Services being provided via telemedicine.   I have been provided ample opportunity to ask questions regarding this consent and the Services and have had  my questions answered to my satisfaction.  I give my informed consent for the services to be provided through the use of telemedicine in my medical care  By participating in this telemedicine visit I agree to the above.       Cardiac Questionnaire:    Since your last visit or hospitalization:    1. Have you been having new or worsening chest pain? NO   2. Have you been having new or worsening shortness of breath? NO 3. Have you been having new or worsening leg swelling, wt gain, or increase in abdominal girth (pants fitting more tightly)? NO   4. Have you had any passing out spells? NO    *A YES to any of these questions would result in the appointment being kept. *If all the answers to these questions are NO, we should indicate that given the current situation regarding the worldwide coronarvirus pandemic, at the recommendation of the CDC, we are looking to limit gatherings in our waiting area, and thus will reschedule their appointment beyond four weeks from today.   _____________   COVID-19 Pre-Screening Questions:   Do you currently have a fever? NO  Have you recently travelled on a cruise, internationally, or to [place], [place], [place], [place], [place], or [place], [place] [employer])? NO  Have you been in contact with someone that is currently pending confirmation of Covid19 testing or has been confirmed to have the Covid19 virus? NO  Are you currently experiencing fatigue or cough? NO    Spoke with patient and and we reviewed her medication.s, allergies, history, and pharmacy.

## 2018-11-09 ENCOUNTER — Encounter (HOSPITAL_COMMUNITY): Payer: Self-pay

## 2018-11-09 ENCOUNTER — Telehealth: Payer: Self-pay

## 2018-11-09 ENCOUNTER — Telehealth (INDEPENDENT_AMBULATORY_CARE_PROVIDER_SITE_OTHER): Payer: Medicare Other | Admitting: Cardiovascular Disease

## 2018-11-09 DIAGNOSIS — E785 Hyperlipidemia, unspecified: Secondary | ICD-10-CM

## 2018-11-09 DIAGNOSIS — I10 Essential (primary) hypertension: Secondary | ICD-10-CM

## 2018-11-09 DIAGNOSIS — Z9861 Coronary angioplasty status: Secondary | ICD-10-CM

## 2018-11-09 DIAGNOSIS — I2510 Atherosclerotic heart disease of native coronary artery without angina pectoris: Secondary | ICD-10-CM

## 2018-11-09 NOTE — Progress Notes (Signed)
Virtual Visit via Video Note   This visit type was conducted due to national recommendations for restrictions regarding the COVID-19 Pandemic (e.g. social distancing) in an effort to limit this patient's exposure and mitigate transmission in our community.  Due to her co-morbid illnesses, this patient is at least at moderate risk for complications without adequate follow up.  This format is felt to be most appropriate for this patient at this time.  All issues noted in this document were discussed and addressed.  A limited physical exam was performed with this format.  Please refer to the patient's chart for her consent to telehealth for [facility].   Evaluation Performed:  Follow-up visit  Date:  11/09/2018   ID:  [name] [name], [name] [name] 73-May-1947, MRN [id number]  Patient Location: Home Provider Location: Home  PCP:  Tracey Harries, MD  Cardiologist:  [name], MD  Electrophysiologist:  None   Chief Complaint: 64-month follow-up CAD  History of Present Illness:    [name] [name] is a 73 y.o. mildly overweight married Caucasian female mother of 3, grandmother of 11 children Actually saw after being referred by [facility] .  I last saw her in the office 07/27/2018. 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  She is never smoked.  Her mother did have a myocardial infarction.  She has hypertension hyperlipidemia.  She had a non-STEMI March 2018 and LAD intervention but Dr. Sharyn Lull.  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  She did have a jailed first diagonal branch which was small and a 50% mid LAD beyond the stented segment which I did not think was significant.    Since I saw her 4 months ago she is remained stable.  She is asymptomatic.  She continues on DAPT.  She is sheltering in place and socially distancing.    The patient does not have symptoms concerning for COVID-19 infection (fever,  chills, cough, or new shortness of breath).    Past Medical History:  Diagnosis Date  . Arthritis    "knees, hips, hands, back" (09/15/2016)  . Constipation   . Coronary artery disease    NSTEMI-PCI March 2018  . Exercise-induced asthma   . Family history of adverse reaction to anesthesia    "daughter gets PONV too"  . GERD (gastroesophageal reflux disease)   . Heart murmur dx'd 09/14/2016  . High cholesterol   . Hypertension   . Migraine    "recently had series of injections; none since; they had been weekly 1 month ago" (09/15/2016)  . Necrotizing fasciitis (HCC)    R index finger- had a amp  . NSTEMI (non-ST elevated myocardial infarction) (HCC)   . Pneumonia ~ 2003  . PONV (postoperative nausea and vomiting)    Patch helps  . RSD (reflex sympathetic dystrophy)    Right arm after surgery   Past Surgical History:  Procedure Laterality Date  . ANTERIOR LATERAL LUMBAR FUSION 4 LEVELS Right 09/20/2012   Procedure: ANTERIOR LATERAL LUMBAR FUSION 4 LEVELS;  Surgeon: Maeola Harman, MD;  Location: [facility] NEURO ORS;  Service: Neurosurgery;  Laterality: Right;  Right L1-2 L2-3 L3-4 L4-5 Anterolateral decompression/fusion/with L5-S1 Posterior lumbar interbody fusion/Percutaneous pedicle screws L1-S1  . BACK SURGERY    . CATARACT EXTRACTION W/ INTRAOCULAR LENS  IMPLANT, BILATERAL Bilateral 2017  . CORONARY ANGIOPLASTY  05/14/2018  . CORONARY ANGIOPLASTY WITH STENT PLACEMENT    . CORONARY BALLOON ANGIOPLASTY N/A 05/14/2018  Procedure: CORONARY BALLOON ANGIOPLASTY;  Surgeon: Runell Gess, MD;  Location: [facility] INVASIVE CV LAB;  Service: Cardiovascular;  Laterality: N/A;  . CORONARY STENT INTERVENTION N/A 09/15/2016   Procedure: Coronary Stent Intervention;  Surgeon: Rinaldo Cloud, MD;  Location: [facility] INVASIVE CV LAB;  Service: Cardiovascular;  Laterality: N/A;  . CORONARY STENT INTERVENTION N/A 05/14/2018   Procedure: CORONARY STENT INTERVENTION;  Surgeon: Runell Gess, MD;  Location: [facility]  INVASIVE CV LAB;  Service: Cardiovascular;  Laterality: N/A;  . FINGER AMPUTATION Right 2008   Index finger  . INTRAVASCULAR PRESSURE WIRE/FFR STUDY N/A 09/15/2016   Procedure: Intravascular Pressure Wire/FFR Study;  Surgeon: Rinaldo Cloud, MD;  Location: [facility] INVASIVE CV LAB;  Service: Cardiovascular;  Laterality: N/A;  . LEFT HEART CATH AND CORONARY ANGIOGRAPHY N/A 09/15/2016   Procedure: Left Heart Cath and Coronary Angiography;  Surgeon: Rinaldo Cloud, MD;  [facility] INVASIVE CV LAB;  Service: Cardiovascular;  Laterality: N/A;  . LEFT HEART CATH AND CORONARY ANGIOGRAPHY N/A 05/14/2018   Procedure: LEFT HEART CATH AND CORONARY ANGIOGRAPHY;  Surgeon: Runell Gess, MD;  Location: [facility] INVASIVE CV LAB;  Service: Cardiovascular;  Laterality: N/A;  . TONSILLECTOMY       Current Meds  Medication Sig  . acetaminophen (TYLENOL) 500 MG tablet Take 500 mg by mouth every 6 (six) hours as needed for headache.   Marland Kitchen AIMOVIG 70 MG/ML SOAJ Inject 70 mg into the skin every 30 (thirty) days.  Marland Kitchen albuterol (PROVENTIL HFA;VENTOLIN HFA) 108 (90 Base) MCG/ACT inhaler Inhale 2 puffs into the lungs 4 (four) times daily as needed for wheezing or shortness of breath.   Marland Kitchen aspirin EC 81 MG tablet Take 81 mg by mouth daily.  . clopidogrel (PLAVIX) 75 MG tablet TAKE 1 TABLET BY MOUTH DAILY.  Marland Kitchen Evolocumab (REPATHA SURECLICK) 140 MG/ML SOAJ Inject 140 mg into the skin every 14 (fourteen) days.  Marland Kitchen lisinopril (PRINIVIL,ZESTRIL) 20 MG tablet Take 20 mg by mouth every evening.  . metoprolol tartrate (LOPRESSOR) 25 MG tablet Take 0.5 tablets (12.5 mg total) by mouth 2 (two) times daily.  . Misc Natural Products (GLUCOSAMINE CHONDROITIN TRIPLE) TABS Take 1 tablet by mouth 2 (two) times daily.  . Multiple Vitamin (MULTIVITAMIN WITH MINERALS) TABS tablet Take 1 tablet by mouth daily. centrum silver  . nitroGLYCERIN (NITROSTAT) 0.4 MG SL tablet Place 1 tablet (0.4 mg total) under the tongue every 5 (five) minutes x 3 doses as  needed.  . nortriptyline (PAMELOR) 50 MG capsule Take 50-100 mg by mouth at bedtime.  . pantoprazole (PROTONIX) 40 MG tablet Take 1 tablet (40 mg total) by mouth daily.  Bertram Gala Glycol-Propyl Glycol (SYSTANE OP) Place 1 drop into both eyes at bedtime.   Marland Kitchen tiZANidine (ZANAFLEX) 4 MG tablet Take 4 mg by mouth 2 (two) times daily as needed (migraine related muscle spasms).     Allergies:   Codeine; Garlic; Chocolate; Atorvastatin; and Pravastatin sodium   Social History   Tobacco Use  . Smoking status: Never Smoker  . Smokeless tobacco: Never Used  Substance Use Topics  . Alcohol use: No  . Drug use: No     Family Hx: The patient's family history includes Brain cancer in her sister; Cancer (age of onset: 29) in her mother; Heart failure in her mother; Kidney failure in her father.  ROS:   Please see the history of present illness.     All other systems reviewed and are negative.   Prior CV studies:   The  following studies were reviewed today:  None  Labs/Other Tests and Data Reviewed:    EKG:  No ECG reviewed.  Recent Labs: 01/08/2018: ALT 15 05/15/2018: BUN 11; Creatinine, Ser 0.90; Hemoglobin 11.9; Platelets 273; Potassium 3.7; Sodium 139   Recent Lipid Panel Lab Results  Component Value Date/Time   CHOL 113 01/08/2018 09:36 AM   TRIG 132 01/08/2018 09:36 AM   HDL 56 01/08/2018 09:36 AM   CHOLHDL 2.0 01/08/2018 09:36 AM   CHOLHDL 4.5 09/15/2016 04:37 AM   LDLCALC 31 01/08/2018 09:36 AM    Wt Readings from Last 3 Encounters:  11/09/18 155 lb (70.3 kg)  07/27/18 172 lb 9.6 oz (78.3 kg)  05/23/18 169 lb (76.7 kg)     Objective:    Vital Signs:  BP (!) 142/85   Pulse 75   Ht 5\' 5"  (1.651 m)   Wt 155 lb (70.3 kg)   BMI 25.79 kg/m    VITAL SIGNS:  reviewed GEN:  no acute distress RESPIRATORY:  normal respiratory effort, symmetric expansion NEURO:  alert and oriented x 3, no obvious focal deficit PSYCH:  normal affect  ASSESSMENT & PLAN:    1.  Coronary artery disease- history of CAD status post non-STEMI March 2018 treated with LAD intervention, PCI and drug-eluting stenting by Dr. Sharyn LullHarwani.  Because of recurrent symptoms I re-catheter 05/06/2018 revealing "in-stent restenosis" and restented her.  She did have a jailed small diagonal branch and 50% LAD stenosis beyond the stented segment for which medical therapy was recommended. 2. Hyperlipidemia- on Repatha with recent lipid profile performed 01/08/2018 revealing total cholesterol 113, LDL 31 and HDL 56 3. Essential hypertension- history of essential hypertension with blood pressure measured by the patient at home this morning at 142/85 with a pulse of 72.  She is on lisinopril and metoprolol.  COVID-19 Education: The signs and symptoms of COVID-19 were discussed with the patient and how to seek care for testing (follow up with PCP or arrange E-visit).  The importance of social distancing was discussed today.  Time:   Today, I have spent 6 minutes with the patient with telehealth technology discussing the above problems.     Medication Adjustments/Labs and Tests Ordered: Current medicines are reviewed at length with the patient today.  Concerns regarding medicines are outlined above.   Tests Ordered: No orders of the defined types were placed in this encounter.   Medication Changes: No orders of the defined types were placed in this encounter.   Disposition:  Follow up in 6 month(s)  Signed, Nanetta BattyJonathan Aaryn Parrilla, MD  11/09/2018 1:49 PM    [facility]

## 2018-11-09 NOTE — Telephone Encounter (Signed)
Patient and/or DPR-approved person aware of AVS instructions and verbalized understanding. Letter including After Visit Summary and any other necessary documents TO BE mailed to the patient's address on file.

## 2018-11-09 NOTE — Patient Instructions (Signed)

## 2018-11-12 ENCOUNTER — Encounter (HOSPITAL_COMMUNITY): Payer: Self-pay

## 2018-11-14 ENCOUNTER — Encounter (HOSPITAL_COMMUNITY): Payer: Self-pay

## 2018-11-16 ENCOUNTER — Encounter (HOSPITAL_COMMUNITY): Payer: Self-pay

## 2018-11-19 ENCOUNTER — Encounter (HOSPITAL_COMMUNITY): Payer: Self-pay

## 2018-11-21 ENCOUNTER — Encounter (HOSPITAL_COMMUNITY): Payer: Self-pay

## 2018-11-23 ENCOUNTER — Encounter (HOSPITAL_COMMUNITY): Payer: Self-pay

## 2018-11-26 ENCOUNTER — Encounter (HOSPITAL_COMMUNITY): Payer: Self-pay

## 2018-11-28 ENCOUNTER — Encounter (HOSPITAL_COMMUNITY): Payer: Self-pay

## 2018-11-30 ENCOUNTER — Encounter (HOSPITAL_COMMUNITY): Payer: Self-pay

## 2018-12-03 ENCOUNTER — Encounter (HOSPITAL_COMMUNITY): Payer: Self-pay

## 2018-12-05 ENCOUNTER — Encounter (HOSPITAL_COMMUNITY): Payer: Self-pay

## 2018-12-07 ENCOUNTER — Encounter (HOSPITAL_COMMUNITY): Payer: Self-pay

## 2018-12-12 ENCOUNTER — Encounter (HOSPITAL_COMMUNITY): Payer: Self-pay

## 2018-12-14 ENCOUNTER — Encounter (HOSPITAL_COMMUNITY): Payer: Self-pay

## 2018-12-17 ENCOUNTER — Encounter (HOSPITAL_COMMUNITY): Payer: Self-pay

## 2018-12-19 ENCOUNTER — Encounter (HOSPITAL_COMMUNITY): Payer: Self-pay

## 2018-12-21 ENCOUNTER — Encounter (HOSPITAL_COMMUNITY): Payer: Self-pay

## 2018-12-24 ENCOUNTER — Encounter (HOSPITAL_COMMUNITY): Payer: Self-pay

## 2018-12-26 ENCOUNTER — Encounter (HOSPITAL_COMMUNITY): Payer: Self-pay

## 2018-12-28 ENCOUNTER — Encounter (HOSPITAL_COMMUNITY): Payer: Self-pay

## 2018-12-31 ENCOUNTER — Encounter (HOSPITAL_COMMUNITY): Payer: Self-pay

## 2019-01-02 ENCOUNTER — Encounter (HOSPITAL_COMMUNITY): Payer: Self-pay

## 2019-01-04 ENCOUNTER — Encounter (HOSPITAL_COMMUNITY): Payer: Self-pay

## 2019-01-07 ENCOUNTER — Encounter (HOSPITAL_COMMUNITY): Payer: Self-pay

## 2019-01-09 ENCOUNTER — Encounter (HOSPITAL_COMMUNITY): Payer: Self-pay

## 2019-01-11 ENCOUNTER — Encounter (HOSPITAL_COMMUNITY): Payer: Self-pay

## 2019-01-14 ENCOUNTER — Encounter (HOSPITAL_COMMUNITY): Payer: Self-pay

## 2019-02-13 ENCOUNTER — Other Ambulatory Visit: Payer: Self-pay | Admitting: Cardiovascular Disease

## 2019-04-22 ENCOUNTER — Telehealth: Payer: Self-pay | Admitting: Cardiovascular Disease

## 2019-04-22 NOTE — Telephone Encounter (Signed)
Spoke with patient of Dr. Gwenlyn Found: -- Tuesday last week while raking leaves she had neck, jaw, chest discomfort, states this was similar before her previous MI. She reports symptoms resolved after 30 minutes but has had chest heaviness since this episode.  -- She thought about going to ED but didn't d/t COVID.  -- She reports SOB has worsened over the weekend.  -- She has not taken NTG  Educated patient on NTG use if needed. Advised an ED eval would've been appropriate given her description of symptoms however she is hesitant and states that when this happened previously she was seen in the office and set up for outpatient heart cath.   No acute appointments available today 10/5 - scheduled for 10/6 DOD appointment with Dr. Lenon Ahmadi if worsening symptoms or episode lasting >30 minutes, she should go to ED for acute eval

## 2019-04-22 NOTE — Telephone Encounter (Signed)
Pt c/o Shortness Of Breath: STAT if SOB developed within the last 24 hours or pt is noticeably SOB on the phone  1. Are you currently SOB (can you hear that pt is SOB on the phone)? no  2. How long have you been experiencing SOB? Since Friday evening.   3. Are you SOB when sitting or when up moving around? When up moving around   4. Are you currently experiencing any other symptoms? Pain in neck and jaw on Saturday.

## 2019-04-23 ENCOUNTER — Other Ambulatory Visit: Payer: Self-pay

## 2019-04-23 ENCOUNTER — Ambulatory Visit: Payer: Medicare Other | Admitting: Cardiovascular Disease

## 2019-04-23 ENCOUNTER — Encounter: Payer: Self-pay | Admitting: Cardiovascular Disease

## 2019-04-23 DIAGNOSIS — E785 Hyperlipidemia, unspecified: Secondary | ICD-10-CM

## 2019-04-23 DIAGNOSIS — Z9861 Coronary angioplasty status: Secondary | ICD-10-CM | POA: Diagnosis not present

## 2019-04-23 DIAGNOSIS — I10 Essential (primary) hypertension: Secondary | ICD-10-CM | POA: Diagnosis not present

## 2019-04-23 DIAGNOSIS — I2510 Atherosclerotic heart disease of native coronary artery without angina pectoris: Secondary | ICD-10-CM | POA: Diagnosis not present

## 2019-04-23 NOTE — Patient Instructions (Addendum)
    [address] [place] [address] Dept: [phone] Loc: [phone]  [name] [name]  04/23/2019  You are scheduled for a Cardiac Catheterization on Thursday, October 8 with Dr. Quay Burow.  1. Please arrive at the [facility] (Main Entrance A) at [facility]: [place], [place] [address] 15 minutes before 10:00 AM (This time is three hours before your procedure to ensure your preparation and that your COVID-19 test results come back in time for your procedure). Free valet parking service is available.   Special note: Every effort is made to have your procedure done on time. Please understand that emergencies sometimes delay scheduled procedures.  2. Diet: Do not eat solid foods after midnight.  The patient may have clear liquids until 5am upon the day of the procedure.  3. Labs:  You will need to have blood drawn tomorrow 04/24/2019. NO APPOINTMENT NECESSARY Go to:  [facility] at [address], [place], [place] [address] FOR YOUR BASIC METABOLIC PANEL, COMPLETE BLOOD COUNT, AND THYROID STIMULATING HORMONE LAB WORK. NO APPOINTMENT IS NEEDED. YOU MUST HAVE THIS LAB WORK DONE BEFORE GOING TO GET YOUR COVID-19 TEST DONE BECAUSE YOU WILL NEED TO SELF-ISOLATE AFTER THE COVID-19 TEST UNTIL THE DAY OF YOUR [place].  You will need a COVID-19 test on the day of your procedure. Please arrive at [facility] 15 minutes before your check-in time of 10:00am  4. Medication instructions in preparation for your procedure:   On the morning of your procedure, take your Plavix/Clopidogrel and any morning medicines NOT listed above.  You may use sips of water.  5. Plan for one night stay--bring personal belongings. 6. Bring a current list of your medications and current insurance cards. 7. You MUST have a responsible person to drive you  home. 8. Someone MUST be with you the first 24 hours after you arrive home or your discharge will be delayed. 9. Please wear clothes that are easy to get on and off and wear slip-on shoes.  Thank you for allowing [place] to care for you!   -- [facility]   _________________________________________________________________________________________________________  Follow-Up: At [facility], you and your health needs are our priority.  As part of our continuing mission to provide you with exceptional heart care, we have created designated Provider Care Teams.  These Care Teams include your primary Cardiologist (physician) and Advanced Practice Providers (APPs -  Physician Assistants and Nurse Practitioners) who all work together to provide you with the care you need, when you need it. You will need a follow up appointment with Dr. Quay Burow after your procedure. TO BE SCHEDULED

## 2019-04-23 NOTE — H&P (View-Only) (Signed)
04/23/2019 [name] [name]   04/28/1946  [id number]  Primary Physician Tracey Harries, MD Primary Cardiologist: Runell Gess MD Nicholes [place], [place]  HPI:  [name] [name] is a 73 y.o.  mildly overweight married Caucasian female mother of 3, grandmother of 38 childrenActually saw after being referred by [facility] .  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 She is never smoked. Her mother did have a myocardial infarction. She has hypertension hyperlipidemia. She had a non-STEMI March 2018 and LAD intervention but Dr. Sharyn Lull. 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 She did have a jailed first diagonal branch which was small and a 50% mid LAD beyond the stented segment which I did not think was significant.   Since I saw her virtually 6 months ago she had done well until proxy 1 week ago when she developed progressive dyspnea on exertion and substernal chest pain.  I am concerned that she may have aggressive in-stent restenosis with her previously placed stents.   Current Meds  Medication Sig  . acetaminophen (TYLENOL) 500 MG tablet Take 500 mg by mouth every 6 (six) hours as needed for headache.   Marland Kitchen AIMOVIG 70 MG/ML SOAJ Inject 70 mg into the skin every 30 (thirty) days.  Marland Kitchen albuterol (PROVENTIL HFA;VENTOLIN HFA) 108 (90 Base) MCG/ACT inhaler Inhale 2 puffs into the lungs 4 (four) times daily as needed for wheezing or shortness of breath.   Marland Kitchen aspirin EC 81 MG tablet Take 81 mg by mouth daily.  . clopidogrel (PLAVIX) 75 MG tablet TAKE 1 TABLET BY MOUTH DAILY.  Marland Kitchen Evolocumab (REPATHA SURECLICK) 140 MG/ML SOAJ Inject 140 mg into the skin every 14 (fourteen) days.  Marland Kitchen lisinopril (PRINIVIL,ZESTRIL) 20 MG tablet Take 20 mg by mouth every evening.  . metoprolol tartrate (LOPRESSOR) 25 MG tablet TAKE 1/2 TABLET (12.5 MG TOTAL) BY MOUTH 2 (TWO) TIMES DAILY.  Marland Kitchen  Misc Natural Products (GLUCOSAMINE CHONDROITIN TRIPLE) TABS Take 1 tablet by mouth 2 (two) times daily.  . Multiple Vitamin (MULTIVITAMIN WITH MINERALS) TABS tablet Take 1 tablet by mouth daily. centrum silver  . nitroGLYCERIN (NITROSTAT) 0.4 MG SL tablet Place 1 tablet (0.4 mg total) under the tongue every 5 (five) minutes x 3 doses as needed.  . nortriptyline (PAMELOR) 50 MG capsule Take 50-100 mg by mouth at bedtime.  . pantoprazole (PROTONIX) 40 MG tablet Take 1 tablet (40 mg total) by mouth daily.  Bertram Gala Glycol-Propyl Glycol (SYSTANE OP) Place 1 drop into both eyes at bedtime.   Marland Kitchen tiZANidine (ZANAFLEX) 4 MG tablet Take 4 mg by mouth 2 (two) times daily as needed (migraine related muscle spasms).  . Vitamin D, Cholecalciferol, 25 MCG (1000 UT) CAPS Take 1,000 mg by mouth 2 (two) times daily.     Allergies  Allergen Reactions  . Codeine Anaphylaxis  . Garlic Anaphylaxis  . Chocolate     Migraines  . Atorvastatin Other (See Comments)    Muscle aches  . Pravastatin Sodium Other (See Comments)    Muscle pain    Social History   Socioeconomic History  . Marital status: Married    Spouse name: Not on file  . Number of children: Not on file  . Years of education: Not on file  . Highest education level: Not on file  Occupational History  . Not on file  Social Needs  . Physicist, medical  strain: Not on file  . Food insecurity    Worry: Not on file    Inability: Not on file  . Transportation needs    Medical: Not on file    Non-medical: Not on file  Tobacco Use  . Smoking status: Never Smoker  . Smokeless tobacco: Never Used  Substance and Sexual Activity  . Alcohol use: No  . Drug use: No  . Sexual activity: Not on file  Lifestyle  . Physical activity    Days per week: Not on file    Minutes per session: Not on file  . Stress: Not on file  Relationships  . Social Herbalist on phone: Not on file    Gets together: Not on file    Attends religious  service: Not on file    Active member of club or organization: Not on file    Attends meetings of clubs or organizations: Not on file    Relationship status: Not on file  . Intimate partner violence    Fear of current or ex partner: Not on file    Emotionally abused: Not on file    Physically abused: Not on file    Forced sexual activity: Not on file  Other Topics Concern  . Not on file  Social History Narrative  . Not on file     Review of Systems: General: negative for chills, fever, night sweats or weight changes.  Cardiovascular: negative for chest pain, dyspnea on exertion, edema, orthopnea, palpitations, paroxysmal nocturnal dyspnea or shortness of breath Dermatological: negative for rash Respiratory: negative for cough or wheezing Urologic: negative for hematuria Abdominal: negative for nausea, vomiting, diarrhea, bright red blood per rectum, melena, or hematemesis Neurologic: negative for visual changes, syncope, or dizziness All other systems reviewed and are otherwise negative except as noted above.    Blood pressure (!) 147/93, pulse 81, height 5\' 5"  (1.651 m), weight 170 lb 6.4 oz (77.3 kg), SpO2 98 %.  General appearance: alert and no distress Neck: no adenopathy, no carotid bruit, no JVD, supple, symmetrical, trachea midline and thyroid not enlarged, symmetric, no tenderness/mass/nodules Lungs: clear to auscultation bilaterally Heart: regular rate and rhythm, S1, S2 normal, no murmur, click, rub or gallop Extremities: extremities normal, atraumatic, no cyanosis or edema Pulses: 2+ and symmetric Skin: Skin color, texture, turgor normal. No rashes or lesions Neurologic: Alert and oriented X 3, normal strength and tone. Normal symmetric reflexes. Normal coordination and gait  EKG sinus rhythm at 81 with minimal voltage for LVH.  I personally reviewed this EKG.  ASSESSMENT AND PLAN:   Essential hypertension History of essential hypertension with blood pressure  measured today at 147/93.  She is on lisinopril and metoprolol  CAD S/P PCI History of CAD status post non-STEMI, PCI drug-eluting stenting by Dr. Terrence Dupont 09/15/2016.  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  I performed PTCA of the origin of the diagonal branch and restenting of the LAD for "in-stent restenosis with a 3 mm x 16 mm long Synergy drug-eluting stent postdilated to 3.21 mm.  She did have a 50% stenosis beyond the stented segment and I do not think was physiologically indicated.  She is a left dominant system.  She had normal LV function.  Over the last week she has had progressive dyspnea on exertion and some substernal chest pain.  Worried that she is had recurrent  in-stent restenosis and may require CABG.  I am arranging her clinical outpatient radial diagnostic cath this Thursday.  Dyslipidemia, goal LDL below 70 History of hyperlipidemia on Repatha with lipid profile performed 01/08/2018 revealing a total cholesterol 113, LDL 31 and HDL 56      Runell GessJonathan J. Jailen Lung MD [facility] Trans[facility] HospitalFACP,FACC,FAHA, [facility] CenterFSCAI 04/23/2019 3:40 PM

## 2019-04-23 NOTE — Assessment & Plan Note (Signed)
History of hyperlipidemia on Repatha with lipid profile performed 01/08/2018 revealing a total cholesterol 113, LDL 31 and HDL 56

## 2019-04-23 NOTE — Assessment & Plan Note (Signed)
History of essential hypertension with blood pressure measured today at 147/93.  She is on lisinopril and metoprolol

## 2019-04-23 NOTE — Progress Notes (Signed)
04/23/2019 [name] [name] [name]   04/28/1946  [id number]  Primary Physician [name] Harries, MD Primary Cardiologist: [name] Gess MD [name] [name] [name], [name] [name] [name]  HPI:  [name] [name] [name] is a 73 y.o.  mildly overweight married Caucasian female mother of [name] [name], [name] of 38 childrenActually saw after being referred by [facility] .  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 She is never smoked. Her mother did have a myocardial infarction. She has hypertension hyperlipidemia. She had a non-STEMI March 2018 and LAD intervention but Dr. Sharyn Lull. 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 She did have a jailed first diagonal branch which was small and a 50% mid LAD beyond the stented segment which I did not think was significant.   Since I saw her virtually 6 months ago she had done well until proxy 1 week ago when she developed progressive dyspnea on exertion and substernal chest pain.  I am concerned that she may have aggressive in-stent restenosis with her previously placed stents.   Current Meds  Medication Sig  . acetaminophen (TYLENOL) 500 MG tablet Take 500 mg by mouth every 6 (six) hours as needed for headache.   Marland Kitchen AIMOVIG 70 MG/ML SOAJ Inject 70 mg into the skin every 30 (thirty) days.  Marland Kitchen albuterol (PROVENTIL HFA;VENTOLIN HFA) 108 (90 Base) MCG/ACT inhaler Inhale 2 puffs into the lungs 4 (four) times daily as needed for wheezing or shortness of breath.   Marland Kitchen aspirin EC 81 MG tablet Take 81 mg by mouth daily.  . clopidogrel (PLAVIX) 75 MG tablet TAKE 1 TABLET BY MOUTH DAILY.  Marland Kitchen Evolocumab (REPATHA SURECLICK) 140 MG/ML SOAJ Inject 140 mg into the skin every 14 (fourteen) days.  Marland Kitchen lisinopril (PRINIVIL,ZESTRIL) 20 MG tablet Take 20 mg by mouth every evening.  . metoprolol tartrate (LOPRESSOR) 25 MG tablet TAKE 1/2 TABLET (12.5 MG TOTAL) BY MOUTH 2 (TWO) TIMES DAILY.  Marland Kitchen  Misc Natural Products (GLUCOSAMINE CHONDROITIN TRIPLE) TABS Take 1 tablet by mouth 2 (two) times daily.  . Multiple Vitamin (MULTIVITAMIN WITH MINERALS) TABS tablet Take 1 tablet by mouth daily. centrum silver  . nitroGLYCERIN (NITROSTAT) 0.4 MG SL tablet Place 1 tablet (0.4 mg total) under the tongue every 5 (five) minutes x [name] [name] doses as needed.  . nortriptyline (PAMELOR) 50 MG capsule Take 50-100 mg by mouth at bedtime.  . pantoprazole (PROTONIX) 40 MG tablet Take 1 tablet (40 mg total) by mouth daily.  Bertram Gala Glycol-Propyl Glycol (SYSTANE OP) Place 1 drop into both eyes at bedtime.   Marland Kitchen tiZANidine (ZANAFLEX) 4 MG tablet Take 4 mg by mouth 2 (two) times daily as needed (migraine related muscle spasms).  . Vitamin D, Cholecalciferol, 25 MCG (1000 UT) CAPS Take 1,000 mg by mouth 2 (two) times daily.     Allergies  Allergen Reactions  . Codeine Anaphylaxis  . Garlic Anaphylaxis  . Chocolate     Migraines  . Atorvastatin Other (See Comments)    Muscle aches  . Pravastatin Sodium Other (See Comments)    Muscle pain    Social History   Socioeconomic History  . Marital status: Married    Spouse name: Not on file  . Number of [name] [name]: Not on file  . Years of education: Not on file  . Highest education level: Not on file  Occupational History  . Not on file  Social Needs  . Physicist, medical  strain: Not on file  . Food insecurity    Worry: Not on file    Inability: Not on file  . Transportation needs    Medical: Not on file    Non-medical: Not on file  Tobacco Use  . Smoking status: Never Smoker  . Smokeless tobacco: Never Used  Substance and Sexual Activity  . Alcohol use: No  . Drug use: No  . Sexual activity: Not on file  Lifestyle  . Physical activity    Days per week: Not on file    Minutes per session: Not on file  . Stress: Not on file  Relationships  . Social Herbalist on phone: Not on file    Gets together: Not on file    Attends religious  service: Not on file    Active member of club or organization: Not on file    Attends meetings of clubs or organizations: Not on file    Relationship status: Not on file  . Intimate partner violence    Fear of current or ex partner: Not on file    Emotionally abused: Not on file    Physically abused: Not on file    Forced sexual activity: Not on file  Other Topics Concern  . Not on file  Social History Narrative  . Not on file     Review of Systems: General: negative for chills, fever, night sweats or weight changes.  Cardiovascular: negative for chest pain, dyspnea on exertion, edema, orthopnea, palpitations, paroxysmal nocturnal dyspnea or shortness of breath Dermatological: negative for rash Respiratory: negative for cough or wheezing Urologic: negative for hematuria Abdominal: negative for nausea, vomiting, diarrhea, bright red blood per rectum, melena, or hematemesis Neurologic: negative for visual changes, syncope, or dizziness All other systems reviewed and are otherwise negative except as noted above.    Blood pressure (!) 147/93, pulse 81, height 5\' 5"  (1.651 m), weight 170 lb 6.4 oz (77.[name] [name] kg), SpO2 98 %.  General appearance: alert and no distress Neck: no adenopathy, no carotid bruit, no JVD, supple, symmetrical, trachea midline and thyroid not enlarged, symmetric, no tenderness/mass/nodules Lungs: clear to auscultation bilaterally Heart: regular rate and rhythm, S1, S2 normal, no murmur, click, rub or gallop Extremities: extremities normal, atraumatic, no cyanosis or edema Pulses: 2+ and symmetric Skin: Skin color, texture, turgor normal. No rashes or lesions Neurologic: Alert and oriented X [name] [name], normal strength and tone. Normal symmetric reflexes. Normal coordination and gait  EKG sinus rhythm at 81 with minimal voltage for LVH.  I personally reviewed this EKG.  ASSESSMENT AND PLAN:   Essential hypertension History of essential hypertension with blood pressure  measured today at 147/93.  She is on lisinopril and metoprolol  CAD S/P PCI History of CAD status post non-STEMI, PCI drug-eluting stenting by Dr. Terrence Dupont [name] [name]/07/2016.  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  I performed PTCA of the origin of the diagonal branch and restenting of the LAD for "in-stent restenosis with a [name] [name] mm x 16 mm long Synergy drug-eluting stent postdilated to [name] [name].21 mm.  She did have a 50% stenosis beyond the stented segment and I do not think was physiologically indicated.  She is a left dominant system.  She had normal LV function.  Over the last week she has had progressive dyspnea on exertion and some substernal chest pain.  Worried that she is had recurrent  in-stent restenosis and may require CABG.  I am arranging her clinical outpatient radial diagnostic cath this Thursday.  Dyslipidemia, goal LDL below 70 History of hyperlipidemia on Repatha with lipid profile performed 01/08/2018 revealing a total cholesterol 113, LDL 31 and HDL 56      Maguire Sime J. Blu Mcglaun MD FACP,FACC,FAHA, FSCAI 04/23/2019 [name] [name]:40 PM 

## 2019-04-23 NOTE — Assessment & Plan Note (Signed)
History of CAD status post non-STEMI, PCI drug-eluting stenting by Dr. Terrence Dupont 09/15/2016.  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  I performed PTCA of the origin of the diagonal branch and restenting of the LAD for "in-stent restenosis with a 3 mm x 16 mm long Synergy drug-eluting stent postdilated to 3.21 mm.  She did have a 50% stenosis beyond the stented segment and I do not think was physiologically indicated.  She is a left dominant system.  She had normal LV function.  Over the last week she has had progressive dyspnea on exertion and some substernal chest pain.  Worried that she is had recurrent in-stent restenosis and may require CABG.  I am arranging her clinical outpatient radial diagnostic cath this Thursday.

## 2019-04-24 ENCOUNTER — Telehealth: Payer: Self-pay

## 2019-04-24 ENCOUNTER — Other Ambulatory Visit: Payer: Self-pay

## 2019-04-24 DIAGNOSIS — R0609 Other forms of dyspnea: Secondary | ICD-10-CM

## 2019-04-24 DIAGNOSIS — I2510 Atherosclerotic heart disease of native coronary artery without angina pectoris: Secondary | ICD-10-CM

## 2019-04-24 DIAGNOSIS — Z9861 Coronary angioplasty status: Secondary | ICD-10-CM

## 2019-04-24 DIAGNOSIS — R072 Precordial pain: Secondary | ICD-10-CM

## 2019-04-24 LAB — BASIC METABOLIC PANEL
BUN/Creatinine Ratio: 14 (ref 12–28)
BUN: 13 mg/dL (ref 8–27)
CO2: 25 mmol/L (ref 20–29)
Calcium: 10.6 mg/dL — ABNORMAL HIGH (ref 8.7–10.3)
Chloride: 103 mmol/L (ref 96–106)
Creatinine, Ser: 0.9 mg/dL (ref 0.57–1.00)
GFR calc Af Amer: 73 mL/min/{1.73_m2} (ref >59)
GFR calc non Af Amer: 64 mL/min/{1.73_m2} (ref >59)
Glucose: 92 mg/dL (ref 65–99)
Potassium: 5 mmol/L (ref 3.5–5.2)
Sodium: 141 mmol/L (ref 134–144)

## 2019-04-24 LAB — CBC
Hematocrit: 42.8 % (ref 34.0–46.6)
Hemoglobin: 14.2 g/dL (ref 11.1–15.9)
MCH: 28.7 pg (ref 26.6–33.0)
MCHC: 33.2 g/dL (ref 31.5–35.7)
MCV: 87 fL (ref 79–97)
Platelets: 298 10*3/uL (ref 150–450)
RBC: 4.94 x10E6/uL (ref 3.77–5.28)
RDW: 13.4 % (ref 11.7–15.4)
WBC: 6.1 10*3/uL (ref 3.4–10.8)

## 2019-04-24 LAB — TSH: TSH: 1.69 u[IU]/mL (ref 0.450–4.500)

## 2019-04-24 NOTE — Telephone Encounter (Signed)
Received message from Dr. Quay Burow requesting that his last procedure scheduled for 1:30pm on 10/8 be moved up to a sooner procedure time.  Contacted cath lab to update on Dr. Gwenlyn Found request. Pt procedure time moved up to 11:30am on 10/8  Contacted pt and informed her that her new arrival time for 10/8 procedure is 8:00am and procedure start time is 1l:30am and that this will still allow for her to have rapid COVID test. Pt verbalized understanding

## 2019-04-24 NOTE — Addendum Note (Signed)
Addended by: Annita Brod on: 04/24/2019 09:54 AM   Modules accepted: Orders

## 2019-04-25 ENCOUNTER — Other Ambulatory Visit: Payer: Self-pay

## 2019-04-25 ENCOUNTER — Encounter (HOSPITAL_COMMUNITY): Payer: Self-pay

## 2019-04-25 ENCOUNTER — Ambulatory Visit (HOSPITAL_COMMUNITY)
Admission: RE | Admit: 2019-04-25 | Discharge: 2019-04-26 | Disposition: A | Discharge status: Home / Self Care | Payer: Medicare Other | Attending: Cardiovascular Disease | Admitting: Cardiovascular Disease

## 2019-04-25 ENCOUNTER — Encounter (HOSPITAL_COMMUNITY)
Admission: RE | Disposition: A | Discharge status: Home / Self Care | Payer: Self-pay | Source: Home / Self Care | Attending: Cardiovascular Disease

## 2019-04-25 DIAGNOSIS — R079 Chest pain, unspecified: Secondary | ICD-10-CM | POA: Diagnosis not present

## 2019-04-25 DIAGNOSIS — E785 Hyperlipidemia, unspecified: Secondary | ICD-10-CM | POA: Diagnosis not present

## 2019-04-25 DIAGNOSIS — I2583 Coronary atherosclerosis due to lipid rich plaque: Secondary | ICD-10-CM | POA: Insufficient documentation

## 2019-04-25 DIAGNOSIS — I25110 Atherosclerotic heart disease of native coronary artery with unstable angina pectoris: Secondary | ICD-10-CM

## 2019-04-25 DIAGNOSIS — Z955 Presence of coronary angioplasty implant and graft: Secondary | ICD-10-CM | POA: Insufficient documentation

## 2019-04-25 DIAGNOSIS — R072 Precordial pain: Secondary | ICD-10-CM

## 2019-04-25 DIAGNOSIS — I2510 Atherosclerotic heart disease of native coronary artery without angina pectoris: Secondary | ICD-10-CM | POA: Diagnosis present

## 2019-04-25 DIAGNOSIS — Z7982 Long term (current) use of aspirin: Secondary | ICD-10-CM | POA: Diagnosis not present

## 2019-04-25 DIAGNOSIS — Z7902 Long term (current) use of antithrombotics/antiplatelets: Secondary | ICD-10-CM | POA: Insufficient documentation

## 2019-04-25 DIAGNOSIS — Z20828 Contact with and (suspected) exposure to other viral communicable diseases: Secondary | ICD-10-CM | POA: Insufficient documentation

## 2019-04-25 DIAGNOSIS — Z9861 Coronary angioplasty status: Secondary | ICD-10-CM

## 2019-04-25 DIAGNOSIS — Z79899 Other long term (current) drug therapy: Secondary | ICD-10-CM | POA: Insufficient documentation

## 2019-04-25 DIAGNOSIS — R0609 Other forms of dyspnea: Secondary | ICD-10-CM | POA: Diagnosis present

## 2019-04-25 DIAGNOSIS — K219 Gastro-esophageal reflux disease without esophagitis: Secondary | ICD-10-CM | POA: Diagnosis present

## 2019-04-25 DIAGNOSIS — Z885 Allergy status to narcotic agent status: Secondary | ICD-10-CM | POA: Diagnosis not present

## 2019-04-25 DIAGNOSIS — I10 Essential (primary) hypertension: Secondary | ICD-10-CM | POA: Insufficient documentation

## 2019-04-25 HISTORY — PX: LEFT HEART CATH AND CORONARY ANGIOGRAPHY: CATH118249

## 2019-04-25 HISTORY — PX: CORONARY BALLOON ANGIOPLASTY: CATH118233

## 2019-04-25 LAB — POCT ACTIVATED CLOTTING TIME
Activated Clotting Time: 329 seconds
Activated Clotting Time: 158 seconds

## 2019-04-25 LAB — SARS CORONAVIRUS 2 BY RT PCR (HOSPITAL ORDER, PERFORMED IN ~~LOC~~ HOSPITAL LAB): SARS Coronavirus 2: NEGATIVE

## 2019-04-25 SURGERY — LEFT HEART CATH AND CORONARY ANGIOGRAPHY
Anesthesia: LOCAL

## 2019-04-25 MED ORDER — HEPARIN (PORCINE) IN NACL 1000-0.9 UT/500ML-% IV SOLN
INTRAVENOUS | Status: DC | PRN
  Administered 2019-04-25: 500 mL

## 2019-04-25 MED ORDER — SODIUM CHLORIDE 0.9 % IV SOLN: 250.0000 mL | INTRAVENOUS | Status: DC | PRN

## 2019-04-25 MED ORDER — SODIUM CHLORIDE 0.9% FLUSH: 3.0000 mL | INTRAVENOUS | Status: DC | PRN

## 2019-04-25 MED ORDER — HYDRALAZINE HCL 20 MG/ML IJ SOLN: 10.0000 mg | INTRAMUSCULAR | Status: AC | PRN

## 2019-04-25 MED ORDER — ASPIRIN 81 MG PO CHEW
81.0000 mg | CHEWABLE_TABLET | Freq: Every day | ORAL | Status: DC
  Administered 2019-04-25 – 2019-04-26 (×2): 81 mg via ORAL
  Filled 2019-04-25 (×2): qty 1

## 2019-04-25 MED ORDER — PANTOPRAZOLE SODIUM 40 MG PO TBEC
40.0000 mg | DELAYED_RELEASE_TABLET | Freq: Every day | ORAL | Status: DC
  Administered 2019-04-26: 08:00:00 40 mg via ORAL
  Filled 2019-04-25: qty 1

## 2019-04-25 MED ORDER — CLOPIDOGREL BISULFATE 75 MG PO TABS
75.0000 mg | ORAL_TABLET | Freq: Every day | ORAL | Status: DC
  Administered 2019-04-26: 75 mg via ORAL
  Filled 2019-04-25: qty 1

## 2019-04-25 MED ORDER — ALBUTEROL SULFATE HFA 108 (90 BASE) MCG/ACT IN AERS: 2.0000 | INHALATION_SPRAY | Freq: Four times a day (QID) | RESPIRATORY_TRACT | Status: DC | PRN

## 2019-04-25 MED ORDER — LIDOCAINE HCL (PF) 1 % IJ SOLN
INTRAMUSCULAR | Status: DC | PRN
  Administered 2019-04-25: 20 mL

## 2019-04-25 MED ORDER — ACETAMINOPHEN 325 MG PO TABS: 650.0000 mg | ORAL_TABLET | ORAL | Status: DC | PRN

## 2019-04-25 MED ORDER — NITROGLYCERIN 0.4 MG SL SUBL: 0.4000 mg | SUBLINGUAL_TABLET | SUBLINGUAL | Status: DC | PRN

## 2019-04-25 MED ORDER — ASPIRIN EC 81 MG PO TBEC: 81.0000 mg | DELAYED_RELEASE_TABLET | Freq: Every day | ORAL | Status: DC

## 2019-04-25 MED ORDER — HEPARIN (PORCINE) IN NACL 1000-0.9 UT/500ML-% IV SOLN
INTRAVENOUS | Status: AC
  Filled 2019-04-25: qty 1000

## 2019-04-25 MED ORDER — SODIUM CHLORIDE 0.9 % WEIGHT BASED INFUSION
1.0000 mL/kg/h | INTRAVENOUS | Status: DC
  Administered 2019-04-25: 0.97 mL/kg/h via INTRAVENOUS

## 2019-04-25 MED ORDER — ASPIRIN 81 MG PO CHEW: 81.0000 mg | CHEWABLE_TABLET | ORAL | Status: DC

## 2019-04-25 MED ORDER — BIVALIRUDIN TRIFLUOROACETATE 250 MG IV SOLR
INTRAVENOUS | Status: AC
  Filled 2019-04-25: qty 250

## 2019-04-25 MED ORDER — NITROGLYCERIN 1 MG/10 ML FOR IR/CATH LAB
INTRA_ARTERIAL | Status: DC | PRN
  Administered 2019-04-25: 200 ug via INTRACORONARY

## 2019-04-25 MED ORDER — SODIUM CHLORIDE 0.9% FLUSH: 3.0000 mL | Freq: Two times a day (BID) | INTRAVENOUS | Status: DC

## 2019-04-25 MED ORDER — CLOPIDOGREL BISULFATE 75 MG PO TABS: 75.0000 mg | ORAL_TABLET | Freq: Every day | ORAL | Status: DC

## 2019-04-25 MED ORDER — METOPROLOL TARTRATE 12.5 MG HALF TABLET
12.5000 mg | ORAL_TABLET | Freq: Two times a day (BID) | ORAL | Status: DC
  Administered 2019-04-25 – 2019-04-26 (×2): 12.5 mg via ORAL
  Filled 2019-04-25 (×2): qty 1

## 2019-04-25 MED ORDER — LABETALOL HCL 5 MG/ML IV SOLN: 10.0000 mg | INTRAVENOUS | Status: AC | PRN

## 2019-04-25 MED ORDER — SODIUM CHLORIDE 0.9 % IV SOLN
INTRAVENOUS | Status: DC | PRN
  Administered 2019-04-25: 13:00:00 1.75 mg/kg/h via INTRAVENOUS

## 2019-04-25 MED ORDER — LISINOPRIL 20 MG PO TABS
20.0000 mg | ORAL_TABLET | Freq: Every evening | ORAL | Status: DC
  Administered 2019-04-25: 20 mg via ORAL
  Filled 2019-04-25: qty 1

## 2019-04-25 MED ORDER — BIVALIRUDIN BOLUS VIA INFUSION - CUPID
INTRAVENOUS | Status: DC | PRN
  Administered 2019-04-25: 56.1 mg via INTRAVENOUS

## 2019-04-25 MED ORDER — IOHEXOL 350 MG/ML SOLN
INTRAVENOUS | Status: DC | PRN
  Administered 2019-04-25: 14:00:00 80 mL

## 2019-04-25 MED ORDER — LIDOCAINE HCL (PF) 1 % IJ SOLN
INTRAMUSCULAR | Status: AC
  Filled 2019-04-25: qty 30

## 2019-04-25 MED ORDER — ONDANSETRON HCL 4 MG/2ML IJ SOLN: 4.0000 mg | Freq: Four times a day (QID) | INTRAMUSCULAR | Status: DC | PRN

## 2019-04-25 MED ORDER — EVOLOCUMAB 140 MG/ML ~~LOC~~ SOAJ: 140.0000 mg | SUBCUTANEOUS | Status: DC

## 2019-04-25 MED ORDER — ALBUTEROL SULFATE (2.5 MG/3ML) 0.083% IN NEBU: 2.5000 mg | INHALATION_SOLUTION | Freq: Four times a day (QID) | RESPIRATORY_TRACT | Status: DC | PRN

## 2019-04-25 MED ORDER — SODIUM CHLORIDE 0.9 % IV SOLN
INTRAVENOUS | Status: AC
  Administered 2019-04-25: 17:00:00 via INTRAVENOUS

## 2019-04-25 MED ORDER — SODIUM CHLORIDE 0.9 % WEIGHT BASED INFUSION
3.0000 mL/kg/h | INTRAVENOUS | Status: DC
  Administered 2019-04-25: 09:00:00 3 mL/kg/h via INTRAVENOUS

## 2019-04-25 SURGICAL SUPPLY — 16 items
BALLN EMERGE MR 2.0X8 (BALLOONS) ×2
BALLN EMERGE MR 2.25X8 (BALLOONS) ×2
BALLOON EMERGE MR 2.0X8 (BALLOONS) IMPLANT
BALLOON EMERGE MR 2.25X8 (BALLOONS) IMPLANT
CATH DXT MULTI JL4 JR4 ANG PIG (CATHETERS) ×1 IMPLANT
CATH VISTA GUIDE 6FR XBLAD3.5 (CATHETERS) ×1 IMPLANT
KIT ENCORE 26 ADVANTAGE (KITS) ×1 IMPLANT
KIT HEART LEFT (KITS) ×2 IMPLANT
PACK CARDIAC CATHETERIZATION (CUSTOM PROCEDURE TRAY) ×2 IMPLANT
SHEATH PINNACLE 5F 10CM (SHEATH) ×1 IMPLANT
SHEATH PINNACLE 6F 10CM (SHEATH) ×1 IMPLANT
SYR MEDRAD MARK 7 150ML (SYRINGE) ×2 IMPLANT
TRANSDUCER W/STOPCOCK (MISCELLANEOUS) ×2 IMPLANT
TUBING CIL FLEX 10 FLL-RA (TUBING) ×3 IMPLANT
WIRE ASAHI PROWATER 180CM (WIRE) ×1 IMPLANT
WIRE EMERALD 3MM-J .035X150CM (WIRE) ×1 IMPLANT

## 2019-04-25 NOTE — Interval H&P Note (Signed)
Cath Lab Visit (complete for each Cath Lab visit)  Clinical Evaluation Leading to the Procedure:   ACS: No.  Non-ACS:    Anginal Classification: CCS II  Anti-ischemic medical therapy: Maximal Therapy (2 or more classes of medications)  Non-Invasive Test Results: No non-invasive testing performed  Prior CABG: No previous CABG      History and Physical Interval Note:  04/25/2019 12:40 PM  [name] [name]  has presented today for surgery, with the diagnosis of chest pain.  The various methods of treatment have been discussed with the patient and family. After consideration of risks, benefits and other options for treatment, the patient has consented to  Procedure(s): LEFT HEART CATH AND CORONARY ANGIOGRAPHY (N/A) as a surgical intervention.  The patient's history has been reviewed, patient examined, no change in status, stable for surgery.  I have reviewed the patient's chart and labs.  Questions were answered to the patient's satisfaction.     Quay Burow

## 2019-04-25 NOTE — Progress Notes (Signed)
Site area: Right groin a 6 french arterial sheath was removed  Site Prior to Removal:  Level 0  Pressure Applied For 20 MINUTES    Bedrest Beginning at   Manual:   Yes.    Patient Status During Pull:  stable  Post Pull Groin Site:  Level 0  Post Pull Instructions Given:  Yes.    Post Pull Pulses Present:  Yes.    Dressing Applied:  Yes.    Comments:   

## 2019-04-26 ENCOUNTER — Encounter (HOSPITAL_COMMUNITY): Payer: Self-pay | Admitting: Cardiovascular Disease

## 2019-04-26 DIAGNOSIS — Z9861 Coronary angioplasty status: Secondary | ICD-10-CM | POA: Diagnosis not present

## 2019-04-26 DIAGNOSIS — I2510 Atherosclerotic heart disease of native coronary artery without angina pectoris: Secondary | ICD-10-CM | POA: Diagnosis not present

## 2019-04-26 DIAGNOSIS — E785 Hyperlipidemia, unspecified: Secondary | ICD-10-CM | POA: Diagnosis not present

## 2019-04-26 DIAGNOSIS — Z20828 Contact with and (suspected) exposure to other viral communicable diseases: Secondary | ICD-10-CM | POA: Diagnosis not present

## 2019-04-26 DIAGNOSIS — I25110 Atherosclerotic heart disease of native coronary artery with unstable angina pectoris: Secondary | ICD-10-CM | POA: Diagnosis not present

## 2019-04-26 DIAGNOSIS — R079 Chest pain, unspecified: Secondary | ICD-10-CM | POA: Diagnosis not present

## 2019-04-26 DIAGNOSIS — I2583 Coronary atherosclerosis due to lipid rich plaque: Secondary | ICD-10-CM | POA: Diagnosis not present

## 2019-04-26 LAB — BASIC METABOLIC PANEL
Anion gap: 8 (ref 5–15)
BUN: 11 mg/dL (ref 8–23)
CO2: 24 mmol/L (ref 22–32)
Calcium: 9.5 mg/dL (ref 8.9–10.3)
Chloride: 108 mmol/L (ref 98–111)
Creatinine, Ser: 0.92 mg/dL (ref 0.44–1.00)
GFR calc Af Amer: >60 mL/min (ref >60)
GFR calc non Af Amer: >60 mL/min (ref >60)
Glucose, Bld: 101 mg/dL — ABNORMAL HIGH (ref 70–99)
Potassium: 4 mmol/L (ref 3.5–5.1)
Sodium: 140 mmol/L (ref 135–145)

## 2019-04-26 LAB — CBC
HCT: 36.2 % (ref 36.0–46.0)
Hemoglobin: 12.1 g/dL (ref 12.0–15.0)
MCH: 28.9 pg (ref 26.0–34.0)
MCHC: 33.4 g/dL (ref 30.0–36.0)
MCV: 86.4 fL (ref 80.0–100.0)
Platelets: 256 10*3/uL (ref 150–400)
RBC: 4.19 MIL/uL (ref 3.87–5.11)
RDW: 13.2 % (ref 11.5–15.5)
WBC: 5.9 10*3/uL (ref 4.0–10.5)
nRBC: 0 % (ref 0.0–0.2)

## 2019-04-26 NOTE — Plan of Care (Signed)
  Problem: Education: Goal: Knowledge of General Education information will improve Description: Including pain rating scale, medication(s)/side effects and non-pharmacologic comfort measures Outcome: Adequate for Discharge   Problem: Clinical Measurements: Goal: Ability to maintain clinical measurements within normal limits will improve Outcome: Adequate for Discharge Goal: Diagnostic test results will improve Outcome: Adequate for Discharge Goal: Cardiovascular complication will be avoided Outcome: Adequate for Discharge   Problem: Nutrition: Goal: Adequate nutrition will be maintained Outcome: Adequate for Discharge   Problem: Pain Managment: Goal: General experience of comfort will improve Outcome: Adequate for Discharge   Problem: Safety: Goal: Ability to remain free from injury will improve Outcome: Adequate for Discharge   Problem: Skin Integrity: Goal: Risk for impaired skin integrity will decrease Outcome: Adequate for Discharge

## 2019-04-26 NOTE — Progress Notes (Signed)
Discharge instructions reviewed with pt by RN, Sam. Copy of instructions given to pt. No scripts  Pt d/c'd via wheelchair with belongings, husband at main entrance to pick pt up.          Escorted by unit staff.

## 2019-04-26 NOTE — Progress Notes (Signed)
CARDIAC REHAB PHASE I   PRE:  Rate/Rhythm: 70 SR    BP: sitting 137/78    SaO2:   MODE:  Ambulation: 430 ft   POST:  Rate/Rhythm: 85 SR    BP: sitting 152/85     SaO2:   Tolerated well, no c/o. Groin sore. Reviewed ed. Pt with good understanding of Plavix, restrictions, diet, exercise, NTG, and CRPII. Will refer to [place], pt wants virtual program as her knee gave her problems last time. Pt is interested in participating in Virtual Cardiac and Pulmonary Rehab. Pt advised that Virtual Cardiac and Pulmonary Rehab is provided at no cost to the patient.  Checklist:  1. Pt has smart device  ie smartphone and/or ipad for downloading an app  Yes 2. Reliable internet/wifi service    Yes 3. Understands how to use their smartphone and navigate within an app.  Yes  Pt verbalized understanding and is in agreement.  [place], ACSM 04/26/2019 9:33 AM

## 2019-04-26 NOTE — Discharge Summary (Signed)
Discharge Summary    Patient ID: [name] [name] Spillers MRN: [id number]; DOB: 26-Apr-1946  Admit date: 04/25/2019 Discharge date: 04/26/2019  Primary Care Provider: Tracey HarriesBouska, David, MD  Primary Cardiologist: Nanetta BattyJonathan Berry, MD  Primary Electrophysiologist:  None   Discharge Diagnoses    Principal Problem:   CAD S/[name] PCI Active Problems:   Essential hypertension   GERD   Dyslipidemia, goal LDL below 70   CAD in native artery   Allergies Allergies  Allergen Reactions  . Codeine Anaphylaxis  . Garlic Anaphylaxis  . Chocolate     Migraines  . Morphine And Related Nausea And Vomiting  . Atorvastatin Other (See Comments)    Muscle aches  . Pravastatin Sodium Other (See Comments)    Muscle pain    Diagnostic Studies/Procedures    Left heart catheterization 04/25/2019:  Previously placed Prox LAD to Mid LAD stent (unknown type) is widely patent.  Mid LAD lesion is 40% stenosed.  1st Diag lesion is 99% stenosed.  Post intervention, there is a 30% residual stenosis.  The left ventricular systolic function is normal.  LV end diastolic pressure is normal.  The left ventricular ejection fraction is 55-65% by visual estimate.  IMPRESSION: [name] has a widely patent proximal LAD stent with the most 40% stenosis in the LAD beyond the stented segment.  The first diagonal branch which arose from within the stented segment had a 99% ostial stenosis probably responsible for her symptoms.  I performed POBA with a 2.25 mm x 8 mm balloon with with an acceptable post angioplasty result.  The sheath will be removed in several hours and pressure held.  Patient will be hydrated overnight and discharged home in the morning.  I will see her back in the office in several weeks for follow-up.  If she has restenosis in the future I may place a small 2.25 mm stent at the ostium of the diagonal branch.  ____________   History of Present Illness     [name] [name] Ossa is a 73 y.o. female with PMH  of CAD s/[name] PCI/DES to LAD 09/2016 with subsequent in-stent restenosis s/[name] re-stenting 04/2018, HTN, HLD, GERD, and migranes who was seen outpatient by Dr. Allyson SabalBerry on 04/23/2019 with complaints of DOE and chest pain, concerning for unstable angina. She was recommended to present for outpatient catheterization to further evaluate her symptoms.  Hospital Course     Consultants: None   1. CAD s/[name] PCI/DES to LAD and POBA to 1st diagonal: patient presented with unstable angina. She underwent LHC 04/25/2019 and was found to have 99% stenosis of 1st diagonal managed with POBA, with patent LAD stent. Her DOE improved following catheterization. Right groin site was without hematoma, bleeding, or erythema. Cr stable post catheterization.  - Continue aspirin and plavix - Continue repatha - Continue metoprolol   2. HTN: BP stable throughout admission. Recommended low sodium diet.  - Continue metoprolol and lisinopril   3. HLD: patient reports LDL was checked last week but results are not available. LDL was 47 11/2018. She is intolerant to statins - Continue repatha  4. GERD: on pantoprazole   Did the patient have an acute coronary syndrome (MI, NSTEMI, STEMI, etc) this admission?:  No                               Did the patient have a percutaneous coronary intervention (stent / angioplasty)?:  Yes.     Cath/PCI Registry  Performance & Quality Measures: 1. Aspirin prescribed? - Yes 2. ADP Receptor Inhibitor (Plavix/Clopidogrel, Brilinta/Ticagrelor or Effient/Prasugrel) prescribed (includes medically managed patients)? - Yes 3. High Intensity Statin (Lipitor 40-80mg  or Crestor 20-40mg ) prescribed? - No - on Repatha due to statin intolerance 4. For EF <40%, was ACEI/ARB prescribed? - Yes 5. For EF <40%, Aldosterone Antagonist (Spironolactone or Eplerenone) prescribed? - Not Applicable (EF >/= 40%) 6. Cardiac Rehab Phase II ordered (Included Medically managed Patients)? - Yes   _____________  Discharge  Vitals Blood pressure 124/66, pulse 71, temperature 97.9 F (36.6 C), resp. rate (!) 23, height 5\' 5"  (1.651 m), weight 76.6 kg, SpO2 94 %.  Filed Weights   04/25/19 0811 04/25/19 1646 04/26/19 0555  Weight: 74.8 kg 77.4 kg 76.6 kg    GEN: Sitting in bedside chair in no acute distress.   Neck: No JVD, no carotid bruits Cardiac: RRR, no murmurs, rubs, or gallops. Right groin site C/D/I, no hematoma Respiratory: Clear to auscultation bilaterally, no wheezes/ rales/ rhonchi GI: NABS, Soft, nontender, non-distended  MS: No edema; No deformity. Neuro:  Nonfocal, moving all extremities spontaneously Psych: Normal affect      Labs & Radiologic Studies    CBC Recent Labs    04/24/19 1138 04/26/19 0438  WBC 6.1 5.9  HGB 14.2 12.1  HCT 42.8 36.2  MCV 87 86.4  PLT 298 256   Basic Metabolic Panel Recent Labs    06/26/19 1138 04/26/19 0438  NA 141 140  K 5.0 4.0  CL 103 108  CO2 25 24  GLUCOSE 92 101*  BUN 13 11  CREATININE 0.90 0.92  CALCIUM 10.6* 9.5   Liver Function Tests No results for input(s): AST, ALT, ALKPHOS, BILITOT, PROT, ALBUMIN in the last 72 hours. No results for input(s): LIPASE, AMYLASE in the last 72 hours. High Sensitivity Troponin:   No results for input(s): TROPONINIHS in the last 720 hours.  BNP Invalid input(s): POCBNP D-Dimer No results for input(s): DDIMER in the last 72 hours. Hemoglobin A1C No results for input(s): HGBA1C in the last 72 hours. Fasting Lipid Panel No results for input(s): CHOL, HDL, LDLCALC, TRIG, CHOLHDL, LDLDIRECT in the last 72 hours. Thyroid Function Tests Recent Labs    04/24/19 1138  TSH 1.690   _____________  No results found. Disposition   Pt was seen by Dr. 06/24/19 and deemed to be a good candidate for discharge home today.  Follow-up Plans & Appointments    Follow-up Information    [place], MD Follow up on 05/10/2019.   Specialties: Cardiology, Radiology Why: Please arrive 15 minutes early  for your 3:15pm post-catheterization appointment Contact information: [address] [place] [phone]          Discharge Instructions    AMB Referral to Cardiac Rehabilitation - Phase II   Complete by: As directed    Diagnosis: Coronary Stents   After initial evaluation and assessments completed: Virtual Based Care may be provided alone or in conjunction with Phase 2 Cardiac Rehab based on patient barriers.: Yes      Discharge Medications   Allergies as of 04/26/2019      Reactions   Codeine Anaphylaxis   Garlic Anaphylaxis   Chocolate    Migraines   Morphine And Related Nausea And Vomiting   Atorvastatin Other (See Comments)   Muscle aches   Pravastatin Sodium Other (See Comments)   Muscle pain      Medication List    TAKE these  medications   acetaminophen 500 MG tablet Commonly known as: TYLENOL Take 500-650 mg by mouth every 6 (six) hours as needed for headache.   Aimovig 70 MG/ML Soaj Generic drug: Erenumab-aooe Inject 70 mg into the skin every 30 (thirty) days.   albuterol 108 (90 Base) MCG/ACT inhaler Commonly known as: VENTOLIN HFA Inhale 2 puffs into the lungs 4 (four) times daily as needed for wheezing or shortness of breath.   aspirin EC 81 MG tablet Take 81 mg by mouth daily.   clopidogrel 75 MG tablet Commonly known as: PLAVIX TAKE 1 TABLET BY MOUTH DAILY.   Evolocumab 140 MG/ML Soaj Commonly known as: Repatha SureClick Inject 195 mg into the skin every 14 (fourteen) days.   fluticasone 50 MCG/ACT nasal spray Commonly known as: FLONASE Place 1-2 sprays into both nostrils daily.   Glucosamine Chondroitin Triple Tabs Take 1 tablet by mouth 2 (two) times daily.   levocetirizine 5 MG tablet Commonly known as: XYZAL Take 5 mg by mouth every evening.   lisinopril 20 MG tablet Commonly known as: ZESTRIL Take 20 mg by mouth every evening.   Magnesium 500 MG Caps Take 500 mg by mouth daily.   metoprolol  tartrate 25 MG tablet Commonly known as: LOPRESSOR TAKE 1/2 TABLET (12.5 MG TOTAL) BY MOUTH 2 (TWO) TIMES DAILY. What changed: See the new instructions.   multivitamin with minerals Tabs tablet Take 1 tablet by mouth daily. centrum silver   nitroGLYCERIN 0.4 MG SL tablet Commonly known as: NITROSTAT Place 1 tablet (0.4 mg total) under the tongue every 5 (five) minutes x 3 doses as needed. What changed: reasons to take this   nortriptyline 25 MG capsule Commonly known as: PAMELOR Take 25-50 mg by mouth 2 (two) times daily as needed (Migraines).   pantoprazole 40 MG tablet Commonly known as: PROTONIX Take 1 tablet (40 mg total) by mouth daily.   SYSTANE OP Place 1 drop into both eyes at bedtime.   Vitamin D (Cholecalciferol) 25 MCG (1000 UT) Caps Take 1,000 mg by mouth See admin instructions. Every tues. and Sat          Outstanding Labs/Studies   None  Duration of Discharge Encounter   Greater than 30 minutes including physician time.  Signed, Abigail Butts, PA-C 04/26/2019, 8:09 AM

## 2019-04-26 NOTE — Discharge Instructions (Signed)

## 2019-05-10 ENCOUNTER — Other Ambulatory Visit: Payer: Self-pay

## 2019-05-10 ENCOUNTER — Ambulatory Visit: Payer: Medicare Other | Admitting: Cardiovascular Disease

## 2019-05-10 ENCOUNTER — Encounter: Payer: Self-pay | Admitting: Cardiovascular Disease

## 2019-05-10 DIAGNOSIS — Z9861 Coronary angioplasty status: Secondary | ICD-10-CM | POA: Diagnosis not present

## 2019-05-10 NOTE — Assessment & Plan Note (Signed)
History of CAD status post non-STEMI in March 2018 with LAD intervention by Dr. Terrence Dupont.  Because of recurrent symptoms I perform cardiac catheterization on her 05/06/2018 revealing "in-stent restenosis which I restented with an excellent result.  She did have a jailed first diagonal branch and a 50% LAD beyond the stented segment which I did not think was significant.  I saw her in 04/23/2019 she was complaining of progressive dyspnea on exertion and substernal chest pain.  Based on this I performed repeat cardiac catheterization on her 04/25/2019 revealing a patent stent as well as only a minimal lesion beyond the stented segment in the 40% range and 90% diagonal branch stenosis which was jailed within the prior stents.  I performed POBA with a 2.25 mm balloon resulting in reduction of a 90% stenosis to 30% residual.  Her symptoms have completely resolved.

## 2019-05-10 NOTE — Progress Notes (Signed)
05/10/2019 [name] [name]   12-Feb-1946  [id number]  Primary Physician Bernerd Limbo, MD Primary Cardiologist: Lorretta Harp MD [name], [place]  HPI:  [name] [name] is a 73 y.o.  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 She is never smoked. Her mother did have a myocardial infarction. She has hypertension hyperlipidemia. She had a non-STEMI March 2018 and LAD intervention but Dr. Terrence Dupont. 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 She did have a jailed first diagonal branch which was small and a 50% mid LAD beyond the stented segment which I did not think was significant.  Because of progressive dyspnea on exertion and substernal chest pain which I developed a week or so prior to her most recent office visit on 04/23/2019  I was concerned that she may have aggressive in-stent restenosis with her previously placed stents.  I performed diagnostic coronary angiography on her 04/25/2019 revealing a patent LAD stented segment, 40% disease beyond the LAD stent and 90% ostial diagonal branch stenosis.  I performed PCI using a 2.25 mm balloon of the diagonal branch through the stent struts resulting reduction of a 90% ostial first diagonal branch stenosis to less than 30%.  She was discharged home the following day.  Her symptoms have completely resolved.    Current Meds  Medication Sig   acetaminophen (TYLENOL) 500 MG tablet Take 500-650 mg by mouth every 6 (six) hours as needed for headache.    AIMOVIG 70 MG/ML SOAJ Inject 70 mg into the skin every 30 (thirty) days.   albuterol (PROVENTIL HFA;VENTOLIN HFA) 108 (90 Base) MCG/ACT inhaler Inhale 2 puffs into the lungs 4 (four) times daily as needed for wheezing or shortness  of breath.    aspirin EC 81 MG tablet Take 81 mg by mouth daily.   clopidogrel (PLAVIX) 75 MG tablet TAKE 1 TABLET BY MOUTH DAILY. (Patient taking differently: Take 75 mg by mouth daily. )   Evolocumab (REPATHA SURECLICK) 644 MG/ML SOAJ Inject 140 mg into the skin every 14 (fourteen) days.   fluticasone (FLONASE) 50 MCG/ACT nasal spray Place 1-2 sprays into both nostrils daily.   levocetirizine (XYZAL) 5 MG tablet Take 5 mg by mouth every evening.   lisinopril (PRINIVIL,ZESTRIL) 20 MG tablet Take 20 mg by mouth every evening.   Magnesium 500 MG CAPS Take 500 mg by mouth daily.   metoprolol tartrate (LOPRESSOR) 25 MG tablet TAKE 1/2 TABLET (12.5 MG TOTAL) BY MOUTH 2 (TWO) TIMES DAILY. (Patient taking differently: Take 12.5 mg by mouth 2 (two) times daily. )   Misc Natural Products (GLUCOSAMINE CHONDROITIN TRIPLE) TABS Take 1 tablet by mouth 2 (two) times daily.   Multiple Vitamin (MULTIVITAMIN WITH MINERALS) TABS tablet Take 1 tablet by mouth daily. centrum silver   nitroGLYCERIN (NITROSTAT) 0.4 MG SL tablet Place 1 tablet (0.4 mg total) under the tongue every 5 (five) minutes x 3 doses as needed. (Patient taking differently: Place 0.4 mg under the tongue every 5 (five) minutes x 3 doses as needed for chest pain. )   nortriptyline (PAMELOR) 25 MG capsule Take 25-50 mg by mouth 2 (two) times daily as needed (Migraines).    pantoprazole (PROTONIX) 40 MG tablet Take 1 tablet (40 mg total) by mouth daily.   Polyethyl Glycol-Propyl Glycol (SYSTANE OP) Place 1  drop into both eyes at bedtime.    Vitamin D, Cholecalciferol, 25 MCG (1000 UT) CAPS Take 1,000 mg by mouth See admin instructions. Every tues. and Sat     Allergies  Allergen Reactions   Codeine Anaphylaxis   Garlic Anaphylaxis   Chocolate     Migraines   Morphine And Related Nausea And Vomiting   Atorvastatin Other (See Comments)    Muscle aches   Pravastatin Sodium Other (See Comments)    Muscle pain    Social  History   Socioeconomic History   Marital status: Married    Spouse name: Not on file   Number of children: Not on file   Years of education: Not on file   Highest education level: Not on file  Occupational History   Not on file  Social Needs   Financial resource strain: Not on file   Food insecurity    Worry: Not on file    Inability: Not on file   Transportation needs    Medical: Not on file    Non-medical: Not on file  Tobacco Use   Smoking status: Never Smoker   Smokeless tobacco: Never Used  Substance and Sexual Activity   Alcohol use: No   Drug use: No   Sexual activity: Not on file  Lifestyle   Physical activity    Days per week: Not on file    Minutes per session: Not on file   Stress: Not on file  Relationships   Social connections    Talks on phone: Not on file    Gets together: Not on file    Attends religious service: Not on file    Active member of club or organization: Not on file    Attends meetings of clubs or organizations: Not on file    Relationship status: Not on file   Intimate partner violence    Fear of current or ex partner: Not on file    Emotionally abused: Not on file    Physically abused: Not on file    Forced sexual activity: Not on file  Other Topics Concern   Not on file  Social History Narrative   Not on file     Review of Systems: General: negative for chills, fever, night sweats or weight changes.  Cardiovascular: negative for chest pain, dyspnea on exertion, edema, orthopnea, palpitations, paroxysmal nocturnal dyspnea or shortness of breath Dermatological: negative for rash Respiratory: negative for cough or wheezing Urologic: negative for hematuria Abdominal: negative for nausea, vomiting, diarrhea, bright red blood per rectum, melena, or hematemesis Neurologic: negative for visual changes, syncope, or dizziness All other systems reviewed and are otherwise negative except as noted above.    Blood  pressure (!) 145/85, pulse 77, temperature (!) 97.3 F (36.3 C), height 5\' 5"  (1.651 m), weight 173 lb 9.6 oz (78.7 kg), SpO2 91 %.  General appearance: alert and no distress Neck: no adenopathy, no carotid bruit, no JVD, supple, symmetrical, trachea midline and thyroid not enlarged, symmetric, no tenderness/mass/nodules Lungs: clear to auscultation bilaterally Heart: regular rate and rhythm, S1, S2 normal, no murmur, click, rub or gallop Extremities: extremities normal, atraumatic, no cyanosis or edema Pulses: 2+ and symmetric Skin: Skin color, texture, turgor normal. No rashes or lesions Neurologic: Alert and oriented X 3, normal strength and tone. Normal symmetric reflexes. Normal coordination and gait  EKG not performed today  ASSESSMENT AND PLAN:   S/P PTCA (percutaneous transluminal coronary angioplasty) History of CAD status post non-STEMI in  March 2018 with LAD intervention by Dr. Sharyn LullHarwani.  Because of recurrent symptoms I perform cardiac catheterization on her 05/06/2018 revealing "in-stent restenosis which I restented with an excellent result.  She did have a jailed first diagonal branch and a 50% LAD beyond the stented segment which I did not think was significant.  I saw her in 04/23/2019 she was complaining of progressive dyspnea on exertion and substernal chest pain.  Based on this I performed repeat cardiac catheterization on her 04/25/2019 revealing a patent stent as well as only a minimal lesion beyond the stented segment in the 40% range and 90% diagonal branch stenosis which was jailed within the prior stents.  I performed POBA with a 2.25 mm balloon resulting in reduction of a 90% stenosis to 30% residual.  Her symptoms have completely resolved.      Runell GessJonathan J. Nehemias Sauceda MD FACP,FACC,FAHA, [facility] 05/10/2019 4:02 PM

## 2019-05-10 NOTE — Patient Instructions (Signed)
Medication Instructions:  Your physician recommends that you continue on your current medications as directed. Please refer to the Current Medication list given to you today. If you need a refill on your cardiac medications before your next appointment, please call your pharmacy.   Lab work: none If you have labs (blood work) drawn today and your tests are completely normal, you will receive your results only by: . MyChart Message (if you have MyChart) OR . A paper copy in the mail If you have any lab test that is abnormal or we need to change your treatment, we will call you to review the results.  Testing/Procedures: none  Follow-Up: At [facility], you and your health needs are our priority.  As part of our continuing mission to provide you with exceptional heart care, we have created designated Provider Care Teams.  These Care Teams include your primary Cardiologist (physician) and Advanced Practice Providers (APPs -  Physician Assistants and Nurse Practitioners) who all work together to provide you with the care you need, when you need it. You will need a follow up appointment in 6 months with Dr. Jonathan Berry.  Please call our office 2 months in advance to schedule this appointment.       

## 2019-05-16 ENCOUNTER — Telehealth (HOSPITAL_COMMUNITY): Payer: Self-pay

## 2019-05-16 ENCOUNTER — Encounter (HOSPITAL_COMMUNITY): Payer: Self-pay

## 2019-05-16 NOTE — Telephone Encounter (Signed)
Attempted to call patient in regards to Cardiac Rehab - LM on VM Mailed letter 

## 2019-05-20 ENCOUNTER — Other Ambulatory Visit: Payer: Self-pay

## 2019-05-20 DIAGNOSIS — E785 Hyperlipidemia, unspecified: Secondary | ICD-10-CM

## 2019-05-30 ENCOUNTER — Telehealth (HOSPITAL_COMMUNITY): Payer: Self-pay

## 2019-05-30 NOTE — Telephone Encounter (Signed)
No response from pt regarding CR.  Closed referral.  

## 2019-07-17 ENCOUNTER — Other Ambulatory Visit: Payer: Self-pay

## 2019-07-17 MED ORDER — REPATHA SURECLICK 140 MG/ML ~~LOC~~ SOAJ: 140.0000 mg | SUBCUTANEOUS | 12 refills | Status: DC

## 2019-10-09 IMAGING — MR MR HEAD W/O CM
11 series · 48 of 48 positions shown · non-contrast
Comparison: Brain MRI 02/20/2008

CLINICAL DATA: Daily migraine headaches.

EXAM:
MRI HEAD WITHOUT CONTRAST
TECHNIQUE: Multiplanar, multiecho pulse sequences of the brain and surrounding
structures were obtained without intravenous contrast.

[Series 2: T1 · sagittal · 5.0mm · 0.47mm/px · 1 of 26 slices shown]
[im 1/26]
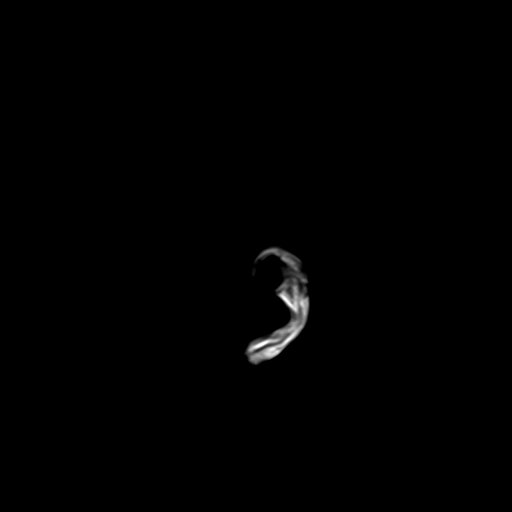

[Series 3: DWI · axial · 3.0mm · 1.80mm/px · z∈[-39,+120]mm · 8 of 105 slices shown (1 of 4)]
[im 1/105]
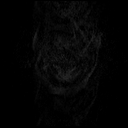
[im 15/105]
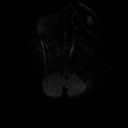
[im 30/105]
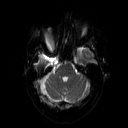
[im 45/105]
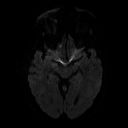
[im 60/105]
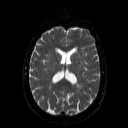
[im 75/105]
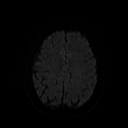
[im 90/105]
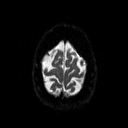
[im 105/105]
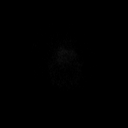

[Series 4: DWI · axial · 3.0mm · 1.80mm/px · z∈[-39,+120]mm · 4 of 53 slices shown (2 of 4)]
[im 1/53]
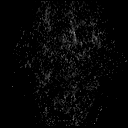
[im 18/53]
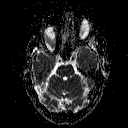
[im 35/53]
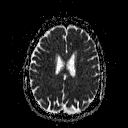
[im 53/53]
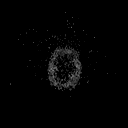

[Series 5: FLAIR · axial · 3.0mm · 0.47mm/px · z∈[-38,+120]mm · 3 of 35 slices shown]
[im 1/35]
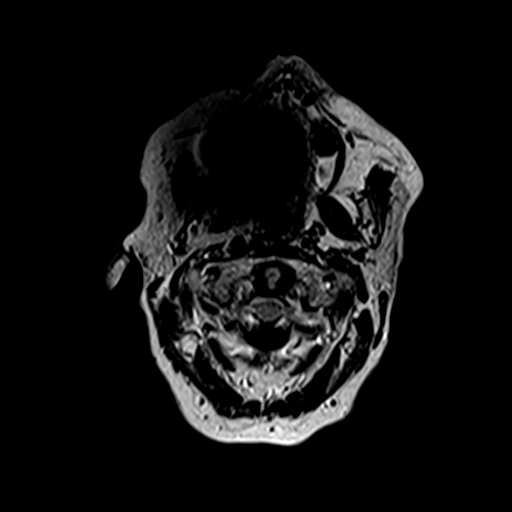
[im 18/35]
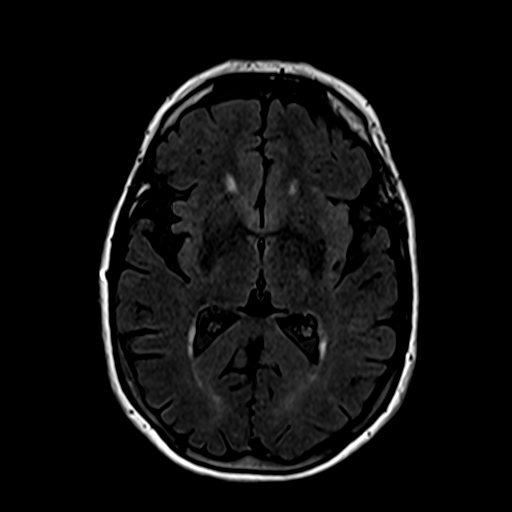
[im 35/35]
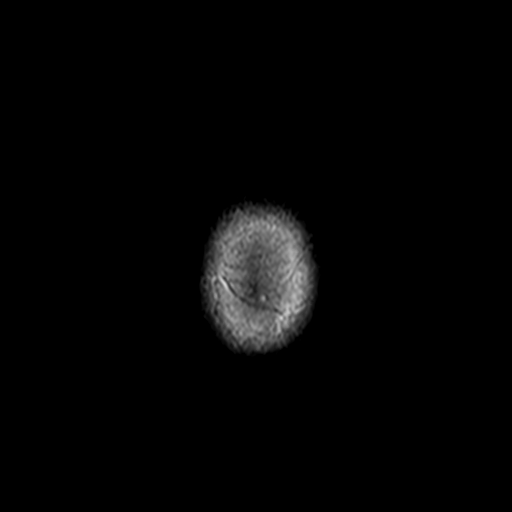

[Series 6: T2 · axial · 5.0mm · 0.62mm/px · z∈[-41,+121]mm · 2 of 25 slices shown (1 of 2)]
[im 1/25]
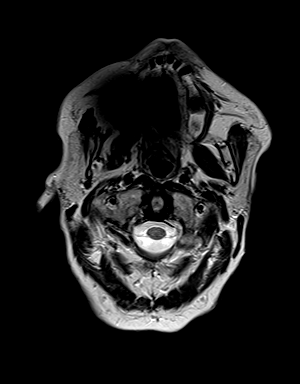
[im 25/25]
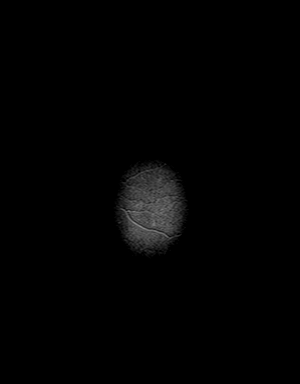

[Series 7: mip_images(sw) · axial · 40.0mm · 0.90mm/px · z∈[-19,+100]mm · 2 of 25 slices shown]
[im 1/25]
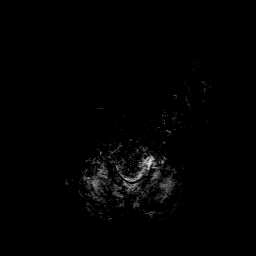
[im 25/25]
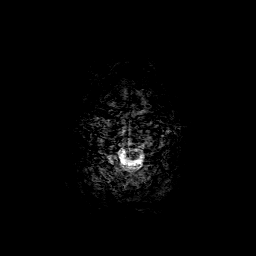

[Series 8: swi_images · axial · 5.0mm · 0.90mm/px · z∈[-37,+118]mm · 3 of 32 slices shown]
[im 1/32]
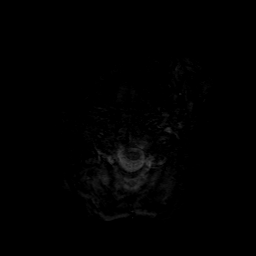
[im 16/32]
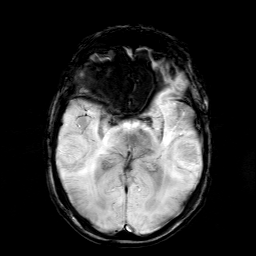
[im 32/32]
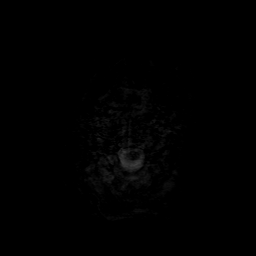

[Series 9: t1_mpr_tra · axial · 1.0mm · 0.75mm/px · z∈[-39,+119]mm · 13 of 160 slices shown]
[im 1/160]
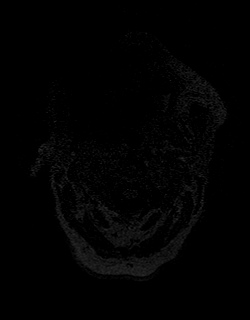
[im 14/160]
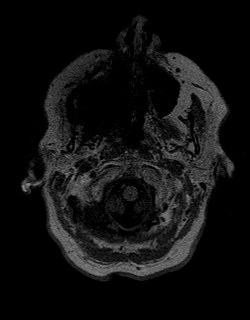
[im 27/160]
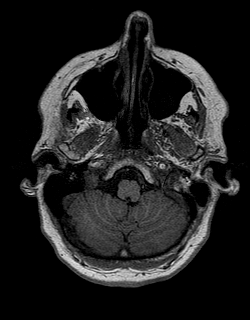
[im 40/160]
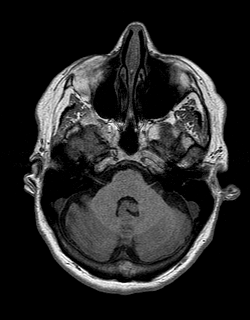
[im 54/160]
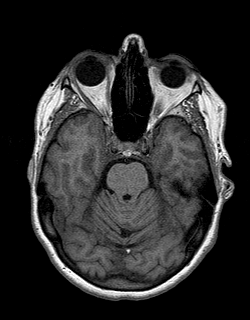
[im 67/160]
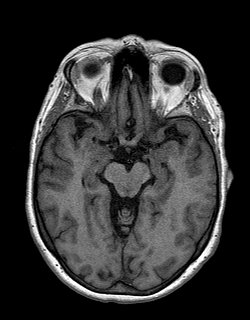
[im 80/160]
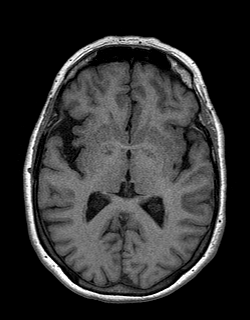
[im 93/160]
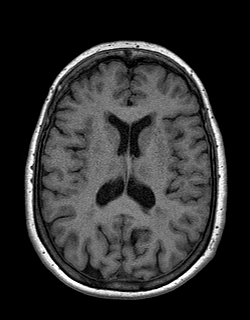
[im 107/160]
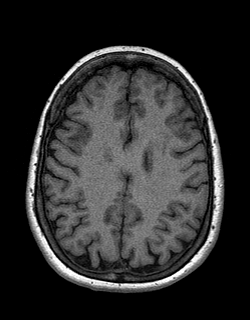
[im 120/160]
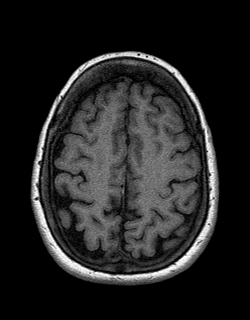
[im 133/160]
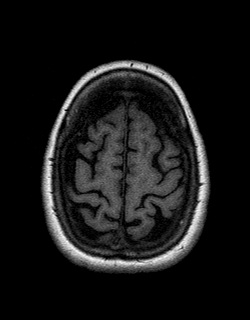
[im 146/160]
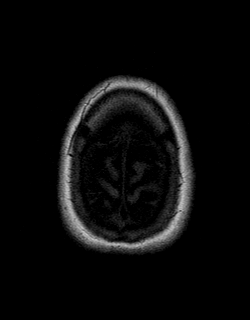
[im 160/160]
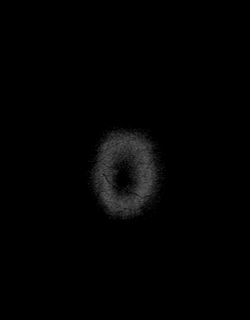

[Series 10: DWI · coronal · 5.0mm · 1.80mm/px · 6 of 71 slices shown (3 of 4)]
[im 1/71]
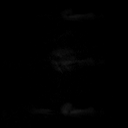
[im 15/71]
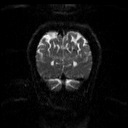
[im 29/71]
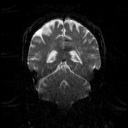
[im 43/71]
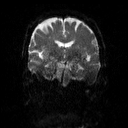
[im 57/71]
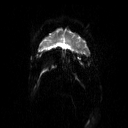
[im 71/71]
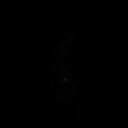

[Series 11: DWI · coronal · 5.0mm · 1.80mm/px · 3 of 41 slices shown (4 of 4)]
[im 1/41]
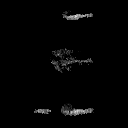
[im 21/41]
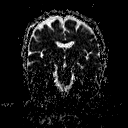
[im 41/41]
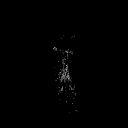

[Series 12: T2 · coronal · 5.0mm · 0.45mm/px · 3 of 32 slices shown (2 of 2)]
[im 1/32]
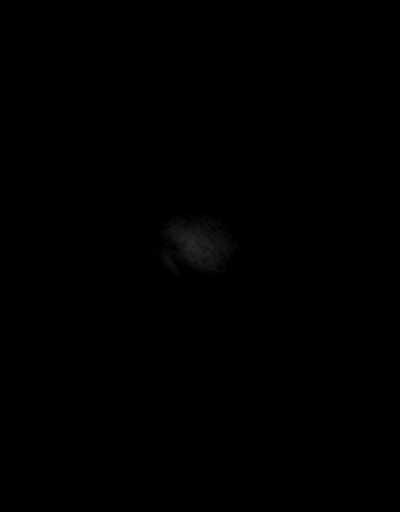
[im 16/32]
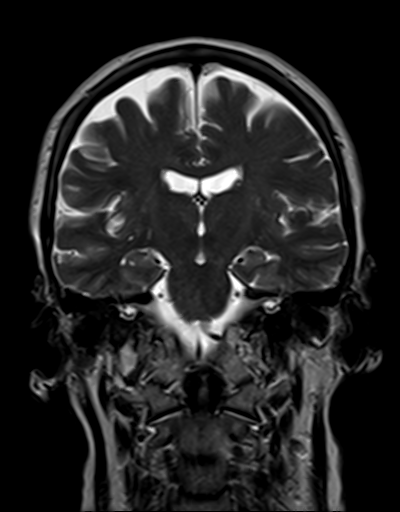
[im 32/32]
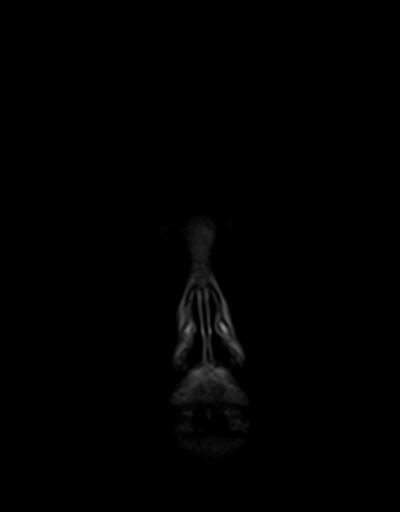

[48 of 48 positions shown; findings below may reference images not displayed]

FINDINGS: Brain: The midline structures are normal. There is no acute infarct
or acute hemorrhage. No mass lesion, hydrocephalus, dural
abnormality or extra-axial collection. There is multifocal
hyperintense T2-weighted signal within the periventricular, deep and
juxtacortical white matter, most often seen in the setting of
chronic microvascular ischemia, which has worsened since the prior
study.. No age-advanced or lobar predominant atrophy. No chronic
microhemorrhage or superficial siderosis.

Vascular: Major intracranial arterial and venous sinus flow voids
are preserved.

Skull and upper cervical spine: The visualized skull base,
calvarium, upper cervical spine and extracranial soft tissues are
normal.

Sinuses/Orbits: No fluid levels or advanced mucosal thickening. No
mastoid or middle ear effusion. Normal orbits.
IMPRESSION: Worsening white matter disease compared to the examination of
02/20/2008, likely chronic ischemic microangiopathy.

## 2019-11-20 ENCOUNTER — Other Ambulatory Visit: Payer: Self-pay | Admitting: Cardiovascular Disease

## 2019-12-23 ENCOUNTER — Other Ambulatory Visit: Payer: Self-pay | Admitting: Cardiovascular Disease

## 2019-12-25 ENCOUNTER — Telehealth: Payer: Self-pay | Admitting: Cardiovascular Disease

## 2019-12-25 NOTE — Telephone Encounter (Signed)
Prescription already sent electronically. Follow up made for patient as patient is past due for office visit.

## 2019-12-25 NOTE — Telephone Encounter (Signed)
New message   Patient needs a new prescription for metoprolol tartrate (LOPRESSOR) 25 MG tablet sent to [facility] - [place], [place] - [address]

## 2020-01-17 ENCOUNTER — Ambulatory Visit: Payer: Medicare Other | Admitting: Cardiovascular Disease

## 2020-03-06 ENCOUNTER — Ambulatory Visit: Payer: Medicare Other | Admitting: Cardiovascular Disease

## 2020-03-06 ENCOUNTER — Other Ambulatory Visit: Payer: Self-pay

## 2020-03-06 ENCOUNTER — Encounter: Payer: Self-pay | Admitting: Cardiovascular Disease

## 2020-03-06 VITALS — BP 126/80 | HR 71 | Ht 65.0 in | Wt 165.6 lb

## 2020-03-06 DIAGNOSIS — I2510 Atherosclerotic heart disease of native coronary artery without angina pectoris: Secondary | ICD-10-CM

## 2020-03-06 DIAGNOSIS — I10 Essential (primary) hypertension: Secondary | ICD-10-CM

## 2020-03-06 DIAGNOSIS — Z9861 Coronary angioplasty status: Secondary | ICD-10-CM

## 2020-03-06 DIAGNOSIS — E785 Hyperlipidemia, unspecified: Secondary | ICD-10-CM | POA: Diagnosis not present

## 2020-03-06 LAB — HEPATIC FUNCTION PANEL
ALT: 11 IU/L (ref 0–32)
AST: 16 IU/L (ref 0–40)
Albumin: 4 g/dL (ref 3.7–4.7)
Alkaline Phosphatase: 72 IU/L (ref 48–121)
Bilirubin Total: 0.4 mg/dL (ref 0.0–1.2)
Bilirubin, Direct: 0.13 mg/dL (ref 0.00–0.40)
Total Protein: 6.4 g/dL (ref 6.0–8.5)

## 2020-03-06 LAB — LIPID PANEL
Chol/HDL Ratio: 2.2 ratio (ref 0.0–4.4)
Cholesterol, Total: 117 mg/dL (ref 100–199)
HDL: 54 mg/dL (ref >39)
LDL Chol Calc (NIH): 41 mg/dL (ref 0–99)
Triglycerides: 122 mg/dL (ref 0–149)
VLDL Cholesterol Cal: 22 mg/dL (ref 5–40)

## 2020-03-06 NOTE — Progress Notes (Signed)
03/06/2020 VERNEDA HOLLOPETER   January 18, 1946  [id number]  Primary Physician Tracey Harries, MD Primary Cardiologist: Runell Gess MD FACP, , [place], [place]  HPI:  MERION CATON is a 74 y.o.  mildly overweight married Caucasian female mother of 3, grandmother of 11 children who I I last saw her in the office 04/23/2019. Unfortunately, her husband of 54 years died of a massive pulmonary embolism in March of this year.  He was somewhat immobile but had received his Covid vaccine 2 weeks prior to that.  She currently lives alone.  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 She is never smoked. Her mother did have a myocardial infarction. She has hypertension hyperlipidemia. She had a non-STEMI March 2018 and LAD intervention but Dr. Sharyn Lull. 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 She did have a jailed first diagonal branch which was small and a 50% mid LAD beyond the stented segment which I did not think was significant.  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  I performed diagnostic coronary angiography on her 04/25/2019 revealing a patent LAD stented segment, 40% disease beyond the LAD stent and 90% ostial diagonal branch stenosis.  I performed PCI using a 2.25 mm balloon of the diagonal branch through the stent struts resulting reduction of a 90% ostial first diagonal branch stenosis to less than 30%.  She was discharged home the following day.  Her symptoms have completely resolved.  Since I saw her a year ago she is remained stable patient does have some mild dyspnea but this is not as bad as it was in the past.  She denies chest pain.  She is on Repatha.   Current Meds  Medication Sig  .  acetaminophen (TYLENOL) 500 MG tablet Take 500-650 mg by mouth every 6 (six) hours as needed for headache.   Marland Kitchen AIMOVIG 70 MG/ML SOAJ Inject 70 mg into the skin every 30 (thirty) days.  Marland Kitchen albuterol (PROVENTIL HFA;VENTOLIN HFA) 108 (90 Base) MCG/ACT inhaler Inhale 2 puffs into the lungs 4 (four) times daily as needed for wheezing or shortness of breath.   Marland Kitchen aspirin EC 81 MG tablet Take 81 mg by mouth daily.  . clopidogrel (PLAVIX) 75 MG tablet TAKE 1 TABLET BY MOUTH DAILY.  Marland Kitchen Evolocumab (REPATHA SURECLICK) 140 MG/ML SOAJ Inject 140 mg into the skin every 14 (fourteen) days.  . fluticasone (FLONASE) 50 MCG/ACT nasal spray Place 1-2 sprays into both nostrils daily.  Marland Kitchen levocetirizine (XYZAL) 5 MG tablet Take 5 mg by mouth every evening.  Marland Kitchen lisinopril (PRINIVIL,ZESTRIL) 20 MG tablet Take 20 mg by mouth every evening.  . Magnesium 500 MG CAPS Take 500 mg by mouth daily.  . metoprolol tartrate (LOPRESSOR) 25 MG tablet Take 0.5 tablets (12.5 mg total) by mouth 2 (two) times daily.  . Misc Natural Products (GLUCOSAMINE CHONDROITIN TRIPLE) TABS Take 1 tablet by mouth 2 (two) times daily.  . nitroGLYCERIN (NITROSTAT) 0.4 MG SL tablet Place 1 tablet (0.4 mg total) under the tongue every 5 (five) minutes x 3 doses as needed. (Patient taking differently: Place 0.4 mg under the tongue every 5 (five) minutes x 3 doses as needed for chest pain. )  . nortriptyline (PAMELOR) 25 MG capsule Take 25-50 mg  by mouth 2 (two) times daily as needed (Migraines).   . pantoprazole (PROTONIX) 40 MG tablet Take 1 tablet (40 mg total) by mouth daily.     Allergies  Allergen Reactions  . Codeine Anaphylaxis  . Garlic Anaphylaxis  . Chocolate     Migraines  . Morphine And Related Nausea And Vomiting  . Atorvastatin Other (See Comments)    Muscle aches  . Pravastatin Sodium Other (See Comments)    Muscle pain    Social History   Socioeconomic History  . Marital status: Married    Spouse name: Not on file  . Number of  children: Not on file  . Years of education: Not on file  . Highest education level: Not on file  Occupational History  . Not on file  Tobacco Use  . Smoking status: Never Smoker  . Smokeless tobacco: Never Used  Vaping Use  . Vaping Use: Never used  Substance and Sexual Activity  . Alcohol use: No  . Drug use: No  . Sexual activity: Not on file  Other Topics Concern  . Not on file  Social History Narrative  . Not on file   Social Determinants of Health   Financial Resource Strain:   . Difficulty of Paying Living Expenses: Not on file  Food Insecurity:   . Worried About Programme researcher, broadcasting/film/video in the Last Year: Not on file  . Ran Out of Food in the Last Year: Not on file  Transportation Needs:   . Lack of Transportation (Medical): Not on file  . Lack of Transportation (Non-Medical): Not on file  Physical Activity:   . Days of Exercise per Week: Not on file  . Minutes of Exercise per Session: Not on file  Stress:   . Feeling of Stress : Not on file  Social Connections:   . Frequency of Communication with Friends and Family: Not on file  . Frequency of Social Gatherings with Friends and Family: Not on file  . Attends Religious Services: Not on file  . Active Member of Clubs or Organizations: Not on file  . Attends Banker Meetings: Not on file  . Marital Status: Not on file  Intimate Partner Violence:   . Fear of Current or Ex-Partner: Not on file  . Emotionally Abused: Not on file  . Physically Abused: Not on file  . Sexually Abused: Not on file     Review of Systems: General: negative for chills, fever, night sweats or weight changes.  Cardiovascular: negative for chest pain, dyspnea on exertion, edema, orthopnea, palpitations, paroxysmal nocturnal dyspnea or shortness of breath Dermatological: negative for rash Respiratory: negative for cough or wheezing Urologic: negative for hematuria Abdominal: negative for nausea, vomiting, diarrhea, bright red  blood per rectum, melena, or hematemesis Neurologic: negative for visual changes, syncope, or dizziness All other systems reviewed and are otherwise negative except as noted above.    Blood pressure 126/80, pulse 71, height 5\' 5"  (1.651 m), weight 165 lb 9.6 oz (75.1 kg), SpO2 97 %.  General appearance: alert and no distress Neck: no adenopathy, no carotid bruit, no JVD, supple, symmetrical, trachea midline and thyroid not enlarged, symmetric, no tenderness/mass/nodules Lungs: clear to auscultation bilaterally Heart: regular rate and rhythm, S1, S2 normal, no murmur, click, rub or gallop Extremities: extremities normal, atraumatic, no cyanosis or edema Pulses: 2+ and symmetric Skin: Skin color, texture, turgor normal. No rashes or lesions Neurologic: Alert and oriented X 3, normal strength and tone. Normal symmetric  reflexes. Normal coordination and gait  EKG sinus rhythm at 71 with nonspecific ST and T wave changes.  I personally reviewed this EKG.  ASSESSMENT AND PLAN:   Essential hypertension History of essential hypertension blood pressure measured today 126/80.  She is on metoprolol and lisinopril.Marland Kitchen  Dyslipidemia, goal LDL below 70 History of dyslipidemia on Repatha.  We will recheck a lipid liver profile this morning.  CAD S/P PCI History of CAD status post non-STEMI March 2018 with LAD intervention by Dr. Mercy Riding because of recurrent symptoms I performed repeat carotid catheterization 05/14/2018 revealing in-stent restenosis which I restented with an excellent result.  She did have a jailed first diagonal branch ostium which was small and 50% mid LAD stenosis beyond this which I did not think was significant.  Because of progressive dyspnea I performed repeat catheterization on her 04/25/2019 which revealed a patent LAD stent.  I did perform angioplasty of the diagonal branch ostium with a 2.25 mm balloon with an excellent result.  Her symptoms improved after that.  She remained  stable since that time.      Runell Gess MD FACP,FACC,FAHA, [facility] 03/06/2020 8:18 AM

## 2020-03-06 NOTE — Patient Instructions (Signed)
Medication Instructions:  The current medical regimen is effective;  continue present plan and medications.  *If you need a refill on your cardiac medications before your next appointment, please call your pharmacy*   Lab Work: LIPID/LIVER today  If you have labs (blood work) drawn today and your tests are completely normal, you will receive your results only by: . MyChart Message (if you have MyChart) OR . A paper copy in the mail If you have any lab test that is abnormal or we need to change your treatment, we will call you to review the results.   Follow-Up: At [facility], you and your health needs are our priority.  As part of our continuing mission to provide you with exceptional heart care, we have created designated Provider Care Teams.  These Care Teams include your primary Cardiologist (physician) and Advanced Practice Providers (APPs -  Physician Assistants and Nurse Practitioners) who all work together to provide you with the care you need, when you need it.  We recommend signing up for the patient portal called "MyChart".  Sign up information is provided on this After Visit Summary.  MyChart is used to connect with patients for Virtual Visits (Telemedicine).  Patients are able to view lab/test results, encounter notes, upcoming appointments, etc.  Non-urgent messages can be sent to your provider as well.   To learn more about what you can do with MyChart, go to [email or web address].    Your next appointment:   12 month(s)  The format for your next appointment:   In Person  Provider:   Jonathan Berry, MD      

## 2020-03-06 NOTE — Assessment & Plan Note (Signed)
History of CAD status post non-STEMI March 2018 with LAD intervention by Dr. Mercy Riding because of recurrent symptoms I performed repeat carotid catheterization 05/14/2018 revealing in-stent restenosis which I restented with an excellent result.  She did have a jailed first diagonal branch ostium which was small and 50% mid LAD stenosis beyond this which I did not think was significant.  Because of progressive dyspnea I performed repeat catheterization on her 04/25/2019 which revealed a patent LAD stent.  I did perform angioplasty of the diagonal branch ostium with a 2.25 mm balloon with an excellent result.  Her symptoms improved after that.  She remained stable since that time.

## 2020-03-06 NOTE — Assessment & Plan Note (Signed)
History of dyslipidemia on Repatha.  We will recheck a lipid liver profile this morning.

## 2020-03-06 NOTE — Assessment & Plan Note (Signed)
History of essential hypertension blood pressure measured today 126/80.  She is on metoprolol and lisinopril.Marland Kitchen

## 2020-08-24 ENCOUNTER — Telehealth: Payer: Self-pay | Admitting: Cardiovascular Disease

## 2020-08-24 MED ORDER — REPATHA SURECLICK 140 MG/ML ~~LOC~~ SOAJ: 140.0000 mg | SUBCUTANEOUS | 3 refills | Status: DC

## 2020-08-24 NOTE — Telephone Encounter (Signed)
Pt c/o medication issue:  1. Name of Medication: Evolocumab (REPATHA SURECLICK) 140 MG/ML SOAJ  2. How are you currently taking this medication (dosage and times per day)? As directed  3. Are you having a reaction (difficulty breathing--STAT)? no  4. What is your medication issue? Patient needs assistance re-applying for grant to cover her medication

## 2020-08-24 NOTE — Telephone Encounter (Signed)
Called and spoke w/pt and got them reapproved for the [employer] grant and I emailed the confirmation to them. I also sent in a refill of the med to [facility]. The pt voiced understanding.

## 2020-09-29 LAB — COLOGUARD: COLOGUARD: NEGATIVE

## 2020-11-11 ENCOUNTER — Other Ambulatory Visit: Payer: Self-pay | Admitting: Cardiovascular Disease

## 2020-12-21 ENCOUNTER — Other Ambulatory Visit: Payer: Self-pay | Admitting: Cardiovascular Disease

## 2021-04-05 ENCOUNTER — Other Ambulatory Visit: Payer: Self-pay | Admitting: Cardiovascular Disease

## 2021-05-28 ENCOUNTER — Telehealth: Payer: Self-pay

## 2021-05-28 NOTE — Telephone Encounter (Signed)
   [facility] Pre-operative Risk Assessment    Patient Name: [name] [name]  DOB: Feb 26, 1946 MRN: [id number]  HEARTCARE STAFF:  - IMPORTANT!!!!!! Under Visit Info/Reason for Call, type in Other and utilize the format Clearance MM/DD/YY or Clearance TBD. Do not use dashes or single digits. - Please review there is not already an duplicate clearance open for this procedure. - If request is for dental extraction, please clarify the # of teeth to be extracted. - If the patient is currently at the dentist's office, call Pre-Op Callback Staff (MA/nurse) to input urgent request.  - If the patient is not currently in the dentist office, please route to the Pre-Op pool.  Request for surgical clearance:  What type of surgery is being performed? Extractions  When is this surgery scheduled? TBD  What type of clearance is required (medical clearance vs. Pharmacy clearance to hold med vs. Both)? BOTH   Are there any medications that need to be held prior to surgery and how long? PLAVIX   Practice name and name of physician performing surgery? Dr. Lurena Joiner, DDS  What is the office phone number? Not listed    7.   What is the office fax number? [phone]  8.   Anesthesia type (None, local, MAC, general) ? Local Anesthetic with Epinepherine   Maralee Higuchi B Deboraha Goar 05/28/2021, 5:03 PM  _________________________________________________________________   (provider comments below)

## 2021-05-31 NOTE — Telephone Encounter (Signed)
I s/w the dental office for Dr. Blanchard Kelch, DDS ph# [phone]. I confirmed with [name] that the pt will be having only 1 tooth extracted. I did ask if they need ASA held as well. [name] states what Tx plan the cardiologist feels is the safest for the pt, they will follow. I will forward back to pre op for further review.

## 2021-05-31 NOTE — Telephone Encounter (Addendum)
OK, thx. Please find out urgency.

## 2021-05-31 NOTE — Telephone Encounter (Signed)
   Patient Name: [name] [name]  DOB: February 01, 1946 MRN: [id number]  Primary Cardiologist: [name], MD  Chart reviewed as part of pre-operative protocol coverage. Typically we request patient be seen within 1 year if overdue for follow-up. However, as below, procedure is time sensitive and risks infection if we delay care. Fortunately simple dental extractions (1-2 teeth) are considered low risk procedures per guidelines and generally do not require any specific cardiac clearance. It is also generally accepted that for simple extractions and dental cleanings, there is no need to interrupt blood thinner therapy. Therefore we do not stop blood thinners to remove one tooth.  We would recommend to avoid/limit use of epinephrine given cardiac history unless absolutely necessary.  SBE prophylaxis is not required for the patient from a cardiac standpoint based on available cardiac records.  Will otherwise keep f/u as scheduled 07/2021 with our clinic.  I will route this recommendation to the requesting party via Epic fax function and remove from pre-op pool.  Please call with questions.  Laurann Montana, PA-C 05/31/2021, 4:49 PM

## 2021-05-31 NOTE — Telephone Encounter (Signed)
I s/w [name] [name] with dental office. Confirmed extraction is more of urgent matter. Pt has been having increasing pain and heat, cold sensitivity. Pt has gone past to even been able to have a root canal and they have to pull the tooth now. I assured [name] I will update the pre op provider.

## 2021-05-31 NOTE — Telephone Encounter (Signed)
   Name: [name] [name]  DOB: 08-08-1945  MRN: [id number]  Primary Cardiologist: [name], MD  Chart reviewed as part of pre-operative protocol coverage. Because of [name]'s past medical history and time since last visit, she will require a follow-up visit in order to better assess preoperative cardiovascular risk.  Last OV 02/2020 (>1 year ago). Also need more information on what procedure is taking place/# extractions as this will impact whether antiplatelet needs to be held.  Pre-op covering staff: - Please schedule appointment and call patient to inform them. If patient already had an upcoming appointment within acceptable timeframe, please add "pre-op clearance" to the appointment notes so provider is aware. - Please contact requesting surgeon's office via preferred method (i.e, phone, fax) to inform them of need for appointment prior to surgery.  Will hold off on sending to Dr. Allyson Sabal re: Plavix until number of extractions is known. Also, at last cath 04/2019, cath report recommended DAPT for at least 12 months and we are now past that - recommend to review plan for long term duration of DAPT with MD at OV, as she also has history of multiple PCIs.  Laurann Montana, PA-C  05/31/2021, 1:58 PM

## 2021-07-21 ENCOUNTER — Telehealth: Payer: Self-pay

## 2021-07-21 ENCOUNTER — Ambulatory Visit: Payer: Medicare Other | Admitting: Cardiovascular Disease

## 2021-07-21 ENCOUNTER — Encounter: Payer: Self-pay | Admitting: Cardiovascular Disease

## 2021-07-21 ENCOUNTER — Other Ambulatory Visit: Payer: Self-pay

## 2021-07-21 DIAGNOSIS — I252 Old myocardial infarction: Secondary | ICD-10-CM

## 2021-07-21 DIAGNOSIS — E785 Hyperlipidemia, unspecified: Secondary | ICD-10-CM | POA: Diagnosis not present

## 2021-07-21 DIAGNOSIS — I10 Essential (primary) hypertension: Secondary | ICD-10-CM | POA: Diagnosis not present

## 2021-07-21 MED ORDER — VALSARTAN 80 MG PO TABS: 80.0000 mg | ORAL_TABLET | Freq: Every evening | ORAL | 3 refills | Status: DC

## 2021-07-21 NOTE — Assessment & Plan Note (Signed)
History of essential hypertension a blood pressure measured today at 162/74.  She says recently her blood pressures have been higher than usual.  She is on lisinopril and metoprolol.  I am going to change lisinopril to Diovan.  We will check a basic metabolic panel in 2 weeks.  I will have her keep a 30-day blood pressure log and see a Pharm.D. back in 4 weeks to review make appropriate changes.

## 2021-07-21 NOTE — Assessment & Plan Note (Signed)
History of CAD status post non-STEMI March 2018 with LAD intervention by Dr. Terrence Dupont.  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  She Did Have a Jailed First Diagonal Branch Which Was Small and a 50% Mid LAD Stenosis beyond the Stented Segment.  Because of Recurrent Pain I Performed Cardiac Catheterization on Her 04/25/2019 Revealing a Patent LAD Stent.  I Ended up Performing Angioplasty of the Ostium of a Diagonal Branch with Excellent Result.  Her Symptoms Resolved.  She No Longer Has Chest Pain or Shortness of Breath.

## 2021-07-21 NOTE — Progress Notes (Signed)
07/21/2021 [name] [name]   [birth year]  [id number]  Primary Physician Bernerd Limbo, MD Primary Cardiologist: Lorretta Harp MD FACP, [place], [place], [place]  HPI:  [name] [name] is a 76 y.o.  mildly overweight married Caucasian female mother of 57, grandmother of 9 children who I I last saw her in the office 03/06/2020.  Unfortunately, her husband of 19 years died of a massive pulmonary embolism in March of this year.  He was somewhat immobile but had received his Covid vaccine 2 weeks prior to that.  She currently lives alone.  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  She is never smoked.  Her mother did have a myocardial infarction.  She has hypertension hyperlipidemia.  She had a non-STEMI March 2018 and LAD intervention but Dr. Terrence Dupont.  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  She did have a jailed first diagonal branch which was small and a 50% mid LAD beyond the stented segment which I did not think was significant.     Because of progressive dyspnea on exertion and substernal chest pain which I developed a week or so prior to her most recent office visit on 04/23/2019  I was concerned that she may have aggressive in-stent restenosis with her previously placed stents.  I performed diagnostic coronary angiography on her 04/25/2019 revealing a patent LAD stented segment, 40% disease beyond the LAD stent and 90% ostial diagonal branch stenosis.  I performed PCI using a 2.25 mm balloon of the diagonal branch through the stent struts resulting reduction of a 90% ostial first diagonal branch stenosis to less than 30%.  She was discharged home the following day.  Her symptoms have completely resolved.   Since I saw her in the office slightly over a year ago she continues to do well.  She denies chest pain or shortness of breath.  Her blood pressure has been more difficult to control recently however.   Current  Meds  Medication Sig   AIMOVIG 70 MG/ML SOAJ Inject 70 mg into the skin every 30 (thirty) days.   aspirin EC 81 MG tablet Take 81 mg by mouth daily.   clopidogrel (PLAVIX) 75 MG tablet TAKE 1 TABLET BY MOUTH DAILY.   Evolocumab (REPATHA SURECLICK) [id number] MG/ML SOAJ Inject 140 mg into the skin every 14 (fourteen) days.   fluticasone (FLONASE) 50 MCG/ACT nasal spray Place 1-2 sprays into both nostrils daily.   levocetirizine (XYZAL) 5 MG tablet Take 5 mg by mouth every evening.   metoprolol tartrate (LOPRESSOR) 25 MG tablet TAKE 1/2 TABLET BY MOUTH 2 TIMES DAILY.   Misc Natural Products (GLUCOSAMINE CHONDROITIN TRIPLE) TABS Take 1 tablet by mouth 2 (two) times daily.   nortriptyline (PAMELOR) 25 MG capsule Take 25-50 mg by mouth 2 (two) times daily as needed (Migraines).    pantoprazole (PROTONIX) 40 MG tablet Take 1 tablet (40 mg total) by mouth daily.   Vitamin D, Ergocalciferol, (DRISDOL) 1.25 MG (50000 UNIT) CAPS capsule Take 50,000 Units by mouth once a week.   [DISCONTINUED] lisinopril (PRINIVIL,ZESTRIL) 20 MG tablet Take 20 mg by mouth every evening.     Allergies  Allergen Reactions   Codeine Anaphylaxis   Garlic Anaphylaxis   Chocolate     Migraines   Morphine And Related Nausea And Vomiting   Atorvastatin Other (See Comments)    Muscle aches   Pravastatin Sodium Other (See Comments)    Muscle  pain    Social History   Socioeconomic History   Marital status: Married    Spouse name: Not on file   Number of children: Not on file   Years of education: Not on file   Highest education level: Not on file  Occupational History   Not on file  Tobacco Use   Smoking status: Never   Smokeless tobacco: Never  Vaping Use   Vaping Use: Never used  Substance and Sexual Activity   Alcohol use: No   Drug use: No   Sexual activity: Not on file  Other Topics Concern   Not on file  Social History Narrative   Not on file   Social Determinants of Health   Financial Resource  Strain: Not on file  Food Insecurity: Not on file  Transportation Needs: Not on file  Physical Activity: Not on file  Stress: Not on file  Social Connections: Not on file  Intimate Partner Violence: Not on file     Review of Systems: General: negative for chills, fever, night sweats or weight changes.  Cardiovascular: negative for chest pain, dyspnea on exertion, edema, orthopnea, palpitations, paroxysmal nocturnal dyspnea or shortness of breath Dermatological: negative for rash Respiratory: negative for cough or wheezing Urologic: negative for hematuria Abdominal: negative for nausea, vomiting, diarrhea, bright red blood per rectum, melena, or hematemesis Neurologic: negative for visual changes, syncope, or dizziness All other systems reviewed and are otherwise negative except as noted above.    Blood pressure (!) 162/74, pulse 81, height 5\' 5"  (1.651 m), weight 171 lb 9.6 oz (77.8 kg).  General appearance: alert and no distress Neck: no adenopathy, no carotid bruit, no JVD, supple, symmetrical, trachea midline, and thyroid not enlarged, symmetric, no tenderness/mass/nodules Lungs: clear to auscultation bilaterally Heart: regular rate and rhythm, S1, S2 normal, no murmur, click, rub or gallop Extremities: extremities normal, atraumatic, no cyanosis or edema Pulses: 2+ and symmetric Skin: Skin color, texture, turgor normal. No rashes or lesions Neurologic: Grossly normal  EKG sinus rhythm 81 with LVH voltage and left axis deviation.  I personally reviewed this EKG.  ASSESSMENT AND PLAN:   Essential hypertension History of essential hypertension a blood pressure measured today at 162/74.  She says recently her blood pressures have been higher than usual.  She is on lisinopril and metoprolol.  I am going to change lisinopril to Diovan.  We will check a basic metabolic panel in 2 weeks.  I will have her keep a 30-day blood pressure log and see a Pharm.D. back in 4 weeks to review  make appropriate changes.  History of NSTEMI History of CAD status post non-STEMI March 2018 with LAD intervention by Dr. Terrence Dupont.  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  She Did Have a Jailed First Diagonal Branch Which Was Small and a 50% Mid LAD Stenosis beyond the Stented Segment.  Because of Recurrent Pain I Performed Cardiac Catheterization on Her 04/25/2019 Revealing a Patent LAD Stent.  I Ended up Performing Angioplasty of the Ostium of a Diagonal Branch with Excellent Result.  Her Symptoms Resolved.  She No Longer Has Chest Pain or Shortness of Breath.  Dyslipidemia, goal LDL below 70 History of dyslipidemia on Repatha with lipid profile performed 03/06/2020 revealing total cholesterol 117, LDL 41 and HDL 54.     Lorretta Harp MD [facility], [facility] 07/21/2021 2:41 PM

## 2021-07-21 NOTE — Telephone Encounter (Signed)
-----   Message from Beatrix Fetters, RN sent at 07/21/2021  2:52 PM EST ----- Regarding: repatha Hey honey,  Pt was asking me about her repatha grant. Can you look into this? She said that it is time to renew. Pt will be coming to blood work per Dr. Gwenlyn Found in 2 weeks if she needs to sign anything.  Thank you!  Rexanne Mano

## 2021-07-21 NOTE — Telephone Encounter (Signed)
Called and provided grant information pt voiced gratitude and understanding  Pharmacy Card CARD NO. [id number]   CARD STATUS Active   BIN [id number]   PCN [id number]   PC GROUP [id number]   HELP DESK [phone]   PROVIDER PDMI   PROCESSOR PDMI

## 2021-07-21 NOTE — Patient Instructions (Signed)
Medication Instructions:   -Stop taking lisinopril.  -Start taking valsartan (diovan) 80mg  once daily.  *If you need a refill on your cardiac medications before your next appointment, please call your pharmacy*   Lab Work: Your physician recommends that you return for lab work in: 2 weeks for BMET  If you have labs (blood work) drawn today and your tests are completely normal, you will receive your results only by: MyChart Message (if you have MyChart) OR A paper copy in the mail If you have any lab test that is abnormal or we need to change your treatment, we will call you to review the results.   Follow-Up: At [facility], you and your health needs are our priority.  As part of our continuing mission to provide you with exceptional heart care, we have created designated Provider Care Teams.  These Care Teams include your primary Cardiologist (physician) and Advanced Practice Providers (APPs -  Physician Assistants and Nurse Practitioners) who all work together to provide you with the care you need, when you need it.  We recommend signing up for the patient portal called "MyChart".  Sign up information is provided on this After Visit Summary.  MyChart is used to connect with patients for Virtual Visits (Telemedicine).  Patients are able to view lab/test results, encounter notes, upcoming appointments, etc.  Non-urgent messages can be sent to your provider as well.   To learn more about what you can do with MyChart, go to [facility].    Your next appointment:   12 month(s)  The format for your next appointment:   In Person  Provider:   [email or web address], MD    Other Instructions Dr. [name] has requested that you schedule an appointment with one of our clinical pharmacists for a blood pressure check appointment within the next 4 weeks.  If you monitor your blood pressure (BP) at home, please bring your BP cuff and your BP readings with you to this appointment  HOW TO  TAKE YOUR BLOOD PRESSURE: Rest 5 minutes before taking your blood pressure. Dont smoke or drink caffeinated beverages for at least 30 minutes before. Take your blood pressure before (not after) you eat. Sit comfortably with your back supported and both feet on the floor (dont cross your legs). Elevate your arm to heart level on a table or a desk. Use the proper sized cuff. It should fit smoothly and snugly around your bare upper arm. There should be enough room to slip a fingertip under the cuff. The bottom edge of the cuff should be 1 inch above the crease of the elbow. Ideally, take 3 measurements at one sitting and record the average.

## 2021-07-21 NOTE — Assessment & Plan Note (Signed)
History of dyslipidemia on Repatha with lipid profile performed 03/06/2020 revealing total cholesterol 117, LDL 41 and HDL 54.

## 2021-07-28 ENCOUNTER — Telehealth: Payer: Self-pay | Admitting: Cardiovascular Disease

## 2021-07-28 NOTE — Telephone Encounter (Signed)
°  Pt c/o BP issue: STAT if pt c/o blurred vision, one-sided weakness or slurred speech  1. What are your last 5 BP readings? 193/100  2. Are you having any other symptoms (ex. Dizziness, headache, blurred vision, passed out)? dizziness  3. What is your BP issue? Pt said she started taking valsartan for her BP, the first 2 days her BP was doing great but after that her BP is climbing back up again, last night her BP was 193/100. She also feels dizzy, yesterday when she stands up she felt dizzy and felt like she is going to pass out. She said that happened twice yesterday

## 2021-07-28 NOTE — Telephone Encounter (Addendum)
Took her blood pressure 190/90s for 3-4 days. Ast night was first time 100. Yesterday when going from sitting to standing felt lightheaded and dizzy. Pt states today she feels weak and shaky, denies dizziness today. She has been on Valsartan for 1 week.  Will get message to Dr. Allyson Sabal for review.   1/6 145/79, 159/86, 180/88 1/7 157/85, 161/90, 175/94 1/8 161/85, 176/93, 180/90 1/9 166/87 HR 96, 190/93 HR 73, 193/95 HR 72 1/10 157/88 HR 70, 165/99 HR 77, 193/100 HR 93

## 2021-07-30 ENCOUNTER — Telehealth: Payer: Self-pay | Admitting: Cardiovascular Disease

## 2021-07-30 MED ORDER — VALSARTAN 160 MG PO TABS: 160.0000 mg | ORAL_TABLET | Freq: Every evening | ORAL | Status: DC

## 2021-07-30 NOTE — Telephone Encounter (Signed)
Patient calling about her call on Wednesday, she states she hasn't heard anything back yet.

## 2021-07-30 NOTE — Addendum Note (Signed)
Addended by: Sandi Mariscal on: 07/30/2021 01:26 PM   Modules accepted: Orders

## 2021-07-30 NOTE — Telephone Encounter (Signed)
Patient has been made aware. She has an appointment with pharmD on 2/1.  Runell Gess, MD  Reynolds Bowl, RN 18 hours ago (6:29 PM)   I think I started her on Diovan 80 mg. Increase to 160 mg and continue to keep a 30 day BP log then FU with Pharm D to review

## 2021-07-30 NOTE — Telephone Encounter (Signed)
Duplicate. See other note.

## 2021-08-13 ENCOUNTER — Other Ambulatory Visit: Payer: Self-pay | Admitting: Cardiovascular Disease

## 2021-08-14 LAB — BASIC METABOLIC PANEL
BUN/Creatinine Ratio: 13 (ref 12–28)
BUN: 9 mg/dL (ref 8–27)
CO2: 25 mmol/L (ref 20–29)
Calcium: 10.2 mg/dL (ref 8.7–10.3)
Chloride: 107 mmol/L — ABNORMAL HIGH (ref 96–106)
Creatinine, Ser: 0.72 mg/dL (ref 0.57–1.00)
Glucose: 89 mg/dL (ref 70–99)
Potassium: 4.4 mmol/L (ref 3.5–5.2)
Sodium: 146 mmol/L — ABNORMAL HIGH (ref 134–144)
eGFR: 87 mL/min/{1.73_m2} (ref >59)

## 2021-08-18 ENCOUNTER — Ambulatory Visit: Payer: Medicare Other | Admitting: Pharmacist Clinician (PhC)/ Clinical Pharmacy Specialist

## 2021-08-18 ENCOUNTER — Other Ambulatory Visit: Payer: Self-pay

## 2021-08-18 DIAGNOSIS — I10 Essential (primary) hypertension: Secondary | ICD-10-CM

## 2021-08-18 MED ORDER — VALSARTAN 320 MG PO TABS: 320.0000 mg | ORAL_TABLET | Freq: Every day | ORAL | 3 refills | Status: DC

## 2021-08-18 NOTE — Patient Instructions (Signed)
Return for a a follow up appointment March 2 at 10:30 am  Ask your PCP to check kidney function.  Check your blood pressure at home daily and keep record of the readings.  Take your BP meds as follows:  Increase valsartan to 320 mg once daily  Continue with all other medications.    Bring all of your meds, your BP cuff and your record of home blood pressures to your next appointment.  Exercise as youre able, try to walk approximately 30 minutes per day.  Keep salt intake to a minimum, especially watch canned and prepared boxed foods.  Eat more fresh fruits and vegetables and fewer canned items.  Avoid eating in fast food restaurants.    HOW TO TAKE YOUR BLOOD PRESSURE: Rest 5 minutes before taking your blood pressure.  Dont smoke or drink caffeinated beverages for at least 30 minutes before. Take your blood pressure before (not after) you eat. Sit comfortably with your back supported and both feet on the floor (dont cross your legs). Elevate your arm to heart level on a table or a desk. Use the proper sized cuff. It should fit smoothly and snugly around your bare upper arm. There should be enough room to slip a fingertip under the cuff. The bottom edge of the cuff should be 1 inch above the crease of the elbow. Ideally, take 3 measurements at one sitting and record the average.

## 2021-08-18 NOTE — Progress Notes (Signed)
08/19/2021 [name] [name] July 21, 1945 [id number]   HPI:  [name] [name] is a 76 y.o. female patient of Dr. Gwenlyn Found, with a PMH below who presents today for hypertension clinic evaluation.  Saw Dr. Gwenlyn Found in January, BP 162/74 at which time he switched lisinopril to valsartan .  She was asked to monitor home readings and follow up with hypertension clinic in 4 weeks.    Patient returns today for follow up.  State she is doing well most days, although when her blood pressure is up she tends to feal weak and dizzy.  She states this occurs when the pressure is around 160/85 or higher.  Looking at her list of home readings, that would be more days than not.  She recently was diagnosed with OSA and started on CPAP just 3 months ago.  States it works well and no issues with device.    Past Medical History: Hyperlipidemia LDL 41 on Repatha  CAD NSTEMI 2018 w/LAD intervention, 2019 had in-stent restenosis, re-stented; 2020 further stenting in ostial diagonal branch     Blood Pressure Goal:  130/80  Current Medications: metoprolol tart 12.5 mg bid, valsartan 160 mg qd  Family Hx: mother died at 83 MI, hypertension for many years, brother with CABG x 3 last year, sister with heart issues/htn; 3 kids no hypertension  Social Hx: no tobacco, no alcohol, 12 oz pepsi most days  Diet: mostly home cooked foods, tries to do less sodium when out (asks for no salt of fries);  protein mostly chicken occasional fish; vegetables fresh or frozen  Exercise: no  Home BP readings: has been checking three times daily for past 27 days  AM: average 155/87  Noon average 166/90  PM average 174/95  Intolerances: statins caused myalgias; no other cardiac medication concerns  Labs: 1/23: Na 146, K 4.4, Glu 89, BUN 9, SCr 0.72, GFR 87   Wt Readings from Last 3 Encounters:  08/18/21 175 lb (79.4 kg)  07/21/21 171 lb 9.6 oz (77.8 kg)  03/06/20 165 lb 9.6 oz (75.1 kg)   BP Readings from Last 3 Encounters:   08/18/21 (!) 150/88  07/21/21 (!) 162/74  03/06/20 126/80   Pulse Readings from Last 3 Encounters:  08/18/21 66  07/21/21 81  03/06/20 71    Current Outpatient Medications  Medication Sig Dispense Refill   acetaminophen (TYLENOL) 500 MG tablet Take 500-650 mg by mouth every 6 (six) hours as needed for headache.     AIMOVIG 70 MG/ML SOAJ Inject 70 mg into the skin every 30 (thirty) days.     albuterol (PROVENTIL HFA;VENTOLIN HFA) 108 (90 Base) MCG/ACT inhaler Inhale 2 puffs into the lungs 4 (four) times daily as needed for wheezing or shortness of breath.     aspirin EC 81 MG tablet Take 81 mg by mouth daily.     clopidogrel (PLAVIX) 75 MG tablet TAKE 1 TABLET BY MOUTH DAILY. 90 tablet 3   Evolocumab (REPATHA SURECLICK) [id number] MG/ML SOAJ Inject 140 mg into the skin every 14 (fourteen) days. 6 mL 3   fluticasone (FLONASE) 50 MCG/ACT nasal spray Place 1-2 sprays into both nostrils daily.     levocetirizine (XYZAL) 5 MG tablet Take 5 mg by mouth every evening.     metoprolol tartrate (LOPRESSOR) 25 MG tablet TAKE 1/2 TABLET BY MOUTH 2 TIMES DAILY. 90 tablet 2   Misc Natural Products (GLUCOSAMINE CHONDROITIN TRIPLE) TABS Take 1 tablet by mouth 2 (two) times daily.  nitroGLYCERIN (NITROSTAT) 0.4 MG SL tablet Place 1 tablet (0.4 mg total) under the tongue every 5 (five) minutes x 3 doses as needed. 75 tablet 2   nortriptyline (PAMELOR) 25 MG capsule Take 25-50 mg by mouth 2 (two) times daily as needed (Migraines).      pantoprazole (PROTONIX) 40 MG tablet Take 1 tablet (40 mg total) by mouth daily. 30 tablet 2   valsartan (DIOVAN) 320 MG tablet Take 1 tablet (320 mg total) by mouth daily. 90 tablet 3   Vitamin D, Ergocalciferol, (DRISDOL) 1.25 MG (50000 UNIT) CAPS capsule Take 50,000 Units by mouth once a week.     No current facility-administered medications for this visit.    Allergies  Allergen Reactions   Codeine Anaphylaxis   Garlic Anaphylaxis   Chocolate     Migraines    Morphine And Related Nausea And Vomiting   Atorvastatin Other (See Comments)    Muscle aches   Pravastatin Sodium Other (See Comments)    Muscle pain    Past Medical History:  Diagnosis Date   Arthritis    "knees, hips, hands, back" (09/15/2016)   Constipation    Coronary artery disease    NSTEMI-PCI March 2018   Exercise-induced asthma    Family history of adverse reaction to anesthesia    "daughter gets PONV too"   GERD (gastroesophageal reflux disease)    Heart murmur dx'd 09/14/2016   High cholesterol    Hypertension    Migraine    "recently had series of injections; none since; they had been weekly 1 month ago" (09/15/2016)   Necrotizing fasciitis (HCC)    R index finger- had a amp   NSTEMI (non-ST elevated myocardial infarction) ([place])    Pneumonia ~ 2003   PONV (postoperative nausea and vomiting)    Patch helps   RSD (reflex sympathetic dystrophy)    Right arm after surgery    Blood pressure (!) 150/88, pulse 66, resp. rate 17, height 5\' 5"  (1.651 m), weight 175 lb (79.4 kg), SpO2 98 %.  Essential hypertension Patient with essential hypertension, still not at BP goal with valsartan 160 mg and metoprolol 12.5 mg bid.  Will have her increase valsartan to 320 mg once daily and continue to monitor.  Discussed option of switching metoprolol to carvedilol should we not be at goal, as well as need to often have 3 or more medications.  She is scheduled to see her PCP in 2 weeks and notes he always draws labs.  Will have her ask that he send copy of labs if not in Epic.  Return in 1 month for follow up.       Tommy Medal PharmD CPP [facility] [address] [place], [place] [address] [phone]

## 2021-08-19 ENCOUNTER — Encounter: Payer: Self-pay | Admitting: Pharmacist Clinician (PhC)/ Clinical Pharmacy Specialist

## 2021-08-19 NOTE — Assessment & Plan Note (Signed)
Patient with essential hypertension, still not at BP goal with valsartan 160 mg and metoprolol 12.5 mg bid.  Will have her increase valsartan to 320 mg once daily and continue to monitor.  Discussed option of switching metoprolol to carvedilol should we not be at goal, as well as need to often have 3 or more medications.  She is scheduled to see her PCP in 2 weeks and notes he always draws labs.  Will have her ask that he send copy of labs if not in Epic.  Return in 1 month for follow up.

## 2021-08-25 ENCOUNTER — Encounter: Payer: Self-pay | Admitting: Pharmacist Clinician (PhC)/ Clinical Pharmacy Specialist

## 2021-08-26 MED ORDER — AMLODIPINE BESYLATE 5 MG PO TABS: 5.0000 mg | ORAL_TABLET | Freq: Every day | ORAL | 3 refills | Status: DC

## 2021-09-16 ENCOUNTER — Ambulatory Visit: Payer: Medicare Other | Admitting: Pharmacist Clinician (PhC)/ Clinical Pharmacy Specialist

## 2021-09-16 ENCOUNTER — Other Ambulatory Visit: Payer: Self-pay

## 2021-09-16 DIAGNOSIS — I10 Essential (primary) hypertension: Secondary | ICD-10-CM

## 2021-09-16 MED ORDER — CARVEDILOL 6.25 MG PO TABS: 6.2500 mg | ORAL_TABLET | Freq: Two times a day (BID) | ORAL | 3 refills | Status: DC

## 2021-09-16 NOTE — Progress Notes (Signed)
? ? ? ?09/16/2021 ?Starling Manns ?26-Apr-1946 ?[id number] ? ? ?HPI:  [name] [name] is a 76 y.o. female patient of Dr. Gwenlyn Found, with a PMH below who presents today for hypertension clinic evaluation.  Saw Dr. Gwenlyn Found in January, BP 162/74 at which time he switched lisinopril to valsartan .  She was asked to monitor home readings and follow up with hypertension clinic in 4 weeks.   ? ?Patient returns today for follow up.  State she is doing well most days, although when her blood pressure is up she tends to feal weak and dizzy.  She states this occurs when the pressure is around 160/85 or higher.  Looking at her list of home readings, that would be more days than not.  She recently was diagnosed with OSA and started on CPAP just 3 months ago.  States it works well and no issues with device.   ? ?I last saw her on Feb 1, at which time her BP was 150/88. Valsartan was increased to 320 mg daily.  About a week later amlodipine 5 mg was added as well.  Today she reports feeling well overall, not changes in other medications and no adverse reactions.   She did note one week with morning readings all in the 130-140 range, states she was at her house in the [place] with family, celebrating her birthday.   ? ?Past Medical History: ?Hyperlipidemia LDL 41 on Repatha  ?CAD NSTEMI 2018 w/LAD intervention, 2019 had in-stent restenosis, re-stented; 2020 further stenting in ostial diagonal branch  ?  ? ?Blood Pressure Goal:  130/80 ? ?Current Medications: metoprolol tart 12.5 mg bid, valsartan 320 mg qd, amlodipine 5 mg qd ? ?Family Hx: mother died at 7 MI, hypertension for many years, brother with CABG x 3 last year, sister with heart issues/htn; 3 kids no hypertension ? ?Social Hx: no tobacco, no alcohol, 12 oz pepsi most days ? ?Diet: mostly home cooked foods, tries to do less sodium when out (asks for no salt of fries);  protein mostly chicken occasional fish; vegetables fresh or frozen ? ?Exercise: no, bad knee, not able to do  much ? ?Home BP readings: has been checking three times daily for past 27 days ? AM: average 153/84 (range 132-168/67-80) previous average 155/87 ? PM average  159/87 (range 151-179/83-92) previous average 174/95 ? ?Intolerances: statins caused myalgias; no other cardiac medication concerns ? ?Labs: 1/23: Na 146, K 4.4, Glu 89, BUN 9, SCr 0.72, GFR 87 ? ? ?Wt Readings from Last 3 Encounters:  ?09/16/21 178 lb (80.7 kg)  ?08/18/21 175 lb (79.4 kg)  ?07/21/21 171 lb 9.6 oz (77.8 kg)  ? ?BP Readings from Last 3 Encounters:  ?09/16/21 138/84  ?08/18/21 (!) 150/88  ?07/21/21 (!) 162/74  ? ?Pulse Readings from Last 3 Encounters:  ?09/16/21 84  ?08/18/21 66  ?07/21/21 81  ? ? ?Current Outpatient Medications  ?Medication Sig Dispense Refill  ? acetaminophen (TYLENOL) 500 MG tablet Take 500-650 mg by mouth every 6 (six) hours as needed for headache.    ? AIMOVIG 70 MG/ML SOAJ Inject 70 mg into the skin every 30 (thirty) days.    ? albuterol (PROVENTIL HFA;VENTOLIN HFA) 108 (90 Base) MCG/ACT inhaler Inhale 2 puffs into the lungs 4 (four) times daily as needed for wheezing or shortness of breath.    ? amLODipine (NORVASC) 5 MG tablet Take 1 tablet (5 mg total) by mouth daily. 30 tablet 3  ? aspirin EC 81 MG tablet Take 81  mg by mouth daily.    ? carvedilol (COREG) 6.25 MG tablet Take 1 tablet (6.25 mg total) by mouth 2 (two) times daily. 60 tablet 3  ? clopidogrel (PLAVIX) 75 MG tablet TAKE 1 TABLET BY MOUTH DAILY. 90 tablet 3  ? Evolocumab (REPATHA SURECLICK) [id number] MG/ML SOAJ Inject 140 mg into the skin every 14 (fourteen) days. 6 mL 3  ? fluticasone (FLONASE) 50 MCG/ACT nasal spray Place 1-2 sprays into both nostrils daily.    ? levocetirizine (XYZAL) 5 MG tablet Take 5 mg by mouth every evening.    ? Misc Natural Products (GLUCOSAMINE CHONDROITIN TRIPLE) TABS Take 1 tablet by mouth 2 (two) times daily.    ? nitroGLYCERIN (NITROSTAT) 0.4 MG SL tablet Place 1 tablet (0.4 mg total) under the tongue every 5 (five) minutes x 3  doses as needed. 75 tablet 2  ? nortriptyline (PAMELOR) 25 MG capsule Take 25-50 mg by mouth daily.    ? pantoprazole (PROTONIX) 40 MG tablet Take 1 tablet (40 mg total) by mouth daily. 30 tablet 2  ? valsartan (DIOVAN) 320 MG tablet Take 1 tablet (320 mg total) by mouth daily. 90 tablet 3  ? Vitamin D, Ergocalciferol, (DRISDOL) 1.25 MG (50000 UNIT) CAPS capsule Take 50,000 Units by mouth once a week.    ? ?No current facility-administered medications for this visit.  ? ? ?Allergies  ?Allergen Reactions  ? Codeine Anaphylaxis  ? Garlic Anaphylaxis  ? Chocolate   ?  Migraines  ? Morphine And Related Nausea And Vomiting  ? Atorvastatin Other (See Comments)  ?  Muscle aches  ? Pravastatin Sodium Other (See Comments)  ?  Muscle pain  ? ? ?Past Medical History:  ?Diagnosis Date  ? Arthritis   ? "knees, hips, hands, back" (09/15/2016)  ? Constipation   ? Coronary artery disease   ? NSTEMI-PCI March 2018  ? Exercise-induced asthma   ? Family history of adverse reaction to anesthesia   ? "daughter gets PONV too"  ? GERD (gastroesophageal reflux disease)   ? Heart murmur dx'd 09/14/2016  ? High cholesterol   ? Hypertension   ? Migraine   ? "recently had series of injections; none since; they had been weekly 1 month ago" (09/15/2016)  ? Necrotizing fasciitis ([place])   ? R index finger- had a amp  ? NSTEMI (non-ST elevated myocardial infarction) ([place])   ? Pneumonia ~ 2003  ? PONV (postoperative nausea and vomiting)   ? Patch helps  ? RSD (reflex sympathetic dystrophy)   ? Right arm after surgery  ? ? ?Blood pressure 138/84, pulse 84, resp. rate 14, height 5' 5.5" (1.664 m), weight 178 lb (80.7 kg), SpO2 98 %. ? ?Essential hypertension ?Patient with essential hypertension, still not at goal, now on 3 medications.  Will have her switch metoprolol to carvedilol 6.25 mg twice daily.  She will continue with regular home monitoring and follow up in 2 months in office.   ? ? ?Tommy Medal PharmD CPP [facility] ?[address] ?[place], [place] [address] ?[phone] ?

## 2021-09-16 NOTE — Patient Instructions (Signed)
Return for a a follow up appointment May 11 at 10 am ? ?Check your blood pressure at home daily and keep record of the readings. ? ?Take your BP meds as follows: ? Stop metoprolol ? Start carvedilol 6.25 mg twice daily ? Continue with all other medications.   ? ?Bring all of your meds, your BP cuff and your record of home blood pressures to your next appointment.  Exercise as you?re able, try to walk approximately 30 minutes per day.  Keep salt intake to a minimum, especially watch canned and prepared boxed foods.  Eat more fresh fruits and vegetables and fewer canned items.  Avoid eating in fast food restaurants.  ? ? HOW TO TAKE YOUR BLOOD PRESSURE: ?Rest 5 minutes before taking your blood pressure. ? Don?t smoke or drink caffeinated beverages for at least 30 minutes before. ?Take your blood pressure before (not after) you eat. ?Sit comfortably with your back supported and both feet on the floor (don?t cross your legs). ?Elevate your arm to heart level on a table or a desk. ?Use the proper sized cuff. It should fit smoothly and snugly around your bare upper arm. There should be enough room to slip a fingertip under the cuff. The bottom edge of the cuff should be 1 inch above the crease of the elbow. ?Ideally, take 3 measurements at one sitting and record the average. ? ? ?

## 2021-09-16 NOTE — Assessment & Plan Note (Signed)
Patient with essential hypertension, still not at goal, now on 3 medications.  Will have her switch metoprolol to carvedilol 6.25 mg twice daily.  She will continue with regular home monitoring and follow up in 2 months in office.   ?

## 2021-10-20 ENCOUNTER — Other Ambulatory Visit: Payer: Self-pay | Admitting: Cardiovascular Disease

## 2021-11-24 NOTE — Progress Notes (Deleted)
11/24/2021 ATTICUS LEMBERGER Dec 20, 1945 [id number]   HPI:  [name] [name] is a 76 y.o. female patient of Dr. Allyson Sabal, with a PMH below who presents today for hypertension clinic evaluation.  Saw Dr. Allyson Sabal in January, BP 162/74 at which time he switched lisinopril to valsartan .  She was asked to monitor home readings and follow up with hypertension clinic in 4 weeks.    Patient returns today for follow up.  State she is doing well most days, although when her blood pressure is up she tends to feal weak and dizzy.  She states this occurs when the pressure is around 160/85 or higher.  Looking at her list of home readings, that would be more days than not.  She recently was diagnosed with OSA and started on CPAP just 3 months ago.  States it works well and no issues with device.    I last saw her on Feb 1, at which time her BP was 150/88. Valsartan was increased to 320 mg daily.  About a week later amlodipine 5 mg was added as well.  Today she reports feeling well overall, not changes in other medications and no adverse reactions.   She did note one week with morning readings all in the 130-140 range, [place].    Past Medical History: Hyperlipidemia LDL 41 on Repatha  CAD NSTEMI 2018 w/LAD intervention, 2019 had in-stent restenosis, re-stented; 2020 further stenting in ostial diagonal branch     Blood Pressure Goal:  130/80  Current Medications: metoprolol tart 12.5 mg bid, valsartan 320 mg qd, amlodipine 5 mg qd  Family Hx: mother died at 24 MI, hypertension for many years, brother with CABG x 3 last year, sister with heart issues/htn; 3 kids no hypertension  Social Hx: no tobacco, no alcohol, 12 oz pepsi most days  Diet: mostly home cooked foods, tries to do less sodium when out (asks for no salt of fries);  protein mostly chicken occasional fish; vegetables fresh or frozen  Exercise: no, bad knee, not able to do  much  Home BP readings: has been checking three times daily for past 27 days  AM: average 153/84 (range 132-168/67-80) previous average 155/87  PM average  159/87 (range 151-179/83-92) previous average 174/95  Intolerances: statins caused myalgias; no other cardiac medication concerns  Labs: 1/23: Na 146, K 4.4, Glu 89, BUN 9, SCr 0.72, GFR 87   Wt Readings from Last 3 Encounters:  09/16/21 178 lb (80.7 kg)  08/18/21 175 lb (79.4 kg)  07/21/21 171 lb 9.6 oz (77.8 kg)   BP Readings from Last 3 Encounters:  09/16/21 138/84  08/18/21 (!) 150/88  07/21/21 (!) 162/74   Pulse Readings from Last 3 Encounters:  09/16/21 84  08/18/21 66  07/21/21 81    Current Outpatient Medications  Medication Sig Dispense Refill   acetaminophen (TYLENOL) 500 MG tablet Take 500-650 mg by mouth every 6 (six) hours as needed for headache.     AIMOVIG 70 MG/ML SOAJ Inject 70 mg into the skin every 30 (thirty) days.     albuterol (PROVENTIL HFA;VENTOLIN HFA) 108 (90 Base) MCG/ACT inhaler Inhale 2 puffs into the lungs 4 (four) times daily as needed for wheezing or shortness of breath.     amLODipine (NORVASC) 5 MG tablet Take 1 tablet (5 mg total) by mouth daily. 30 tablet 3   aspirin EC 81 MG tablet Take 81  mg by mouth daily.     carvedilol (COREG) 6.25 MG tablet Take 1 tablet (6.25 mg total) by mouth 2 (two) times daily. 60 tablet 3   clopidogrel (PLAVIX) 75 MG tablet TAKE 1 TABLET BY MOUTH DAILY. 90 tablet 3   fluticasone (FLONASE) 50 MCG/ACT nasal spray Place 1-2 sprays into both nostrils daily.     levocetirizine (XYZAL) 5 MG tablet Take 5 mg by mouth every evening.     Misc Natural Products (GLUCOSAMINE CHONDROITIN TRIPLE) TABS Take 1 tablet by mouth 2 (two) times daily.     nitroGLYCERIN (NITROSTAT) 0.4 MG SL tablet Place 1 tablet (0.4 mg total) under the tongue every 5 (five) minutes x 3 doses as needed. 75 tablet 2   nortriptyline (PAMELOR) 25 MG capsule Take 25-50 mg by mouth daily.      pantoprazole (PROTONIX) 40 MG tablet Take 1 tablet (40 mg total) by mouth daily. 30 tablet 2   REPATHA SURECLICK 140 MG/ML SOAJ INJECT 140 MG INTO THE SKIN EVERY 14 DAYS. 6 mL 3   valsartan (DIOVAN) 320 MG tablet Take 1 tablet (320 mg total) by mouth daily. 90 tablet 3   Vitamin D, Ergocalciferol, (DRISDOL) 1.25 MG (50000 UNIT) CAPS capsule Take 50,000 Units by mouth once a week.     No current facility-administered medications for this visit.    Allergies  Allergen Reactions   Codeine Anaphylaxis   Garlic Anaphylaxis   Chocolate     Migraines   Morphine And Related Nausea And Vomiting   Atorvastatin Other (See Comments)    Muscle aches   Pravastatin Sodium Other (See Comments)    Muscle pain    Past Medical History:  Diagnosis Date   Arthritis    "knees, hips, hands, back" (09/15/2016)   Constipation    Coronary artery disease    NSTEMI-PCI March 2018   Exercise-induced asthma    Family history of adverse reaction to anesthesia    "daughter gets PONV too"   GERD (gastroesophageal reflux disease)    Heart murmur dx'd 09/14/2016   High cholesterol    Hypertension    Migraine    "recently had series of injections; none since; they had been weekly 1 month ago" (09/15/2016)   Necrotizing fasciitis (HCC)    R index finger- had a amp   NSTEMI (non-ST elevated myocardial infarction) (HCC)    Pneumonia ~ 2003   PONV (postoperative nausea and vomiting)    Patch helps   RSD (reflex sympathetic dystrophy)    Right arm after surgery    There were no vitals taken for this visit.  No problem-specific Assessment & Plan notes found for this encounter.    Phillips Hay PharmD CPP [facility] [address] [place], [place] [address] [phone]

## 2021-11-25 ENCOUNTER — Ambulatory Visit: Payer: Medicare Other

## 2022-01-17 ENCOUNTER — Other Ambulatory Visit: Payer: Self-pay

## 2022-01-17 ENCOUNTER — Emergency Department (HOSPITAL_BASED_OUTPATIENT_CLINIC_OR_DEPARTMENT_OTHER): Payer: Medicare Other

## 2022-01-17 ENCOUNTER — Observation Stay (HOSPITAL_BASED_OUTPATIENT_CLINIC_OR_DEPARTMENT_OTHER)
Admission: EM | Admit: 2022-01-17 | Discharge: 2022-01-21 | Disposition: A | Discharge status: Home / Self Care | Payer: Medicare Other | Attending: Internal Medicine | Admitting: Internal Medicine

## 2022-01-17 ENCOUNTER — Telehealth: Payer: Self-pay | Admitting: Cardiovascular Disease

## 2022-01-17 ENCOUNTER — Encounter (HOSPITAL_BASED_OUTPATIENT_CLINIC_OR_DEPARTMENT_OTHER): Payer: Self-pay | Admitting: Pediatrics

## 2022-01-17 DIAGNOSIS — I10 Essential (primary) hypertension: Secondary | ICD-10-CM | POA: Diagnosis not present

## 2022-01-17 DIAGNOSIS — Z7902 Long term (current) use of antithrombotics/antiplatelets: Secondary | ICD-10-CM | POA: Insufficient documentation

## 2022-01-17 DIAGNOSIS — Z7982 Long term (current) use of aspirin: Secondary | ICD-10-CM | POA: Insufficient documentation

## 2022-01-17 DIAGNOSIS — R0602 Shortness of breath: Secondary | ICD-10-CM

## 2022-01-17 DIAGNOSIS — I110 Hypertensive heart disease with heart failure: Secondary | ICD-10-CM | POA: Diagnosis not present

## 2022-01-17 DIAGNOSIS — I252 Old myocardial infarction: Secondary | ICD-10-CM | POA: Insufficient documentation

## 2022-01-17 DIAGNOSIS — Z79899 Other long term (current) drug therapy: Secondary | ICD-10-CM | POA: Insufficient documentation

## 2022-01-17 DIAGNOSIS — Z20822 Contact with and (suspected) exposure to covid-19: Secondary | ICD-10-CM | POA: Diagnosis not present

## 2022-01-17 DIAGNOSIS — I509 Heart failure, unspecified: Principal | ICD-10-CM

## 2022-01-17 DIAGNOSIS — I2510 Atherosclerotic heart disease of native coronary artery without angina pectoris: Secondary | ICD-10-CM | POA: Diagnosis not present

## 2022-01-17 DIAGNOSIS — Z9861 Coronary angioplasty status: Secondary | ICD-10-CM

## 2022-01-17 DIAGNOSIS — Z955 Presence of coronary angioplasty implant and graft: Secondary | ICD-10-CM | POA: Diagnosis not present

## 2022-01-17 LAB — COMPREHENSIVE METABOLIC PANEL
ALT: 15 U/L (ref 0–44)
AST: 19 U/L (ref 15–41)
Albumin: 3.5 g/dL (ref 3.5–5.0)
Alkaline Phosphatase: 64 U/L (ref 38–126)
Anion gap: 7 (ref 5–15)
BUN: 10 mg/dL (ref 8–23)
CO2: 25 mmol/L (ref 22–32)
Calcium: 9.7 mg/dL (ref 8.9–10.3)
Chloride: 107 mmol/L (ref 98–111)
Creatinine, Ser: 0.71 mg/dL (ref 0.44–1.00)
GFR, Estimated: >60 mL/min (ref >60)
Glucose, Bld: 101 mg/dL — ABNORMAL HIGH (ref 70–99)
Potassium: 4.1 mmol/L (ref 3.5–5.1)
Sodium: 139 mmol/L (ref 135–145)
Total Bilirubin: 0.5 mg/dL (ref 0.3–1.2)
Total Protein: 6.7 g/dL (ref 6.5–8.1)

## 2022-01-17 LAB — CBC WITH DIFFERENTIAL/PLATELET
Abs Immature Granulocytes: 0.06 10*3/uL (ref 0.00–0.07)
Basophils Absolute: 0 10*3/uL (ref 0.0–0.1)
Basophils Relative: 1 %
Eosinophils Absolute: 0.3 10*3/uL (ref 0.0–0.5)
Eosinophils Relative: 5 %
HCT: 38.5 % (ref 36.0–46.0)
Hemoglobin: 12.9 g/dL (ref 12.0–15.0)
Immature Granulocytes: 1 %
Lymphocytes Relative: 27 %
Lymphs Abs: 1.4 10*3/uL (ref 0.7–4.0)
MCH: 28.7 pg (ref 26.0–34.0)
MCHC: 33.5 g/dL (ref 30.0–36.0)
MCV: 85.7 fL (ref 80.0–100.0)
Monocytes Absolute: 0.4 10*3/uL (ref 0.1–1.0)
Monocytes Relative: 7 %
Neutro Abs: 3.1 10*3/uL (ref 1.7–7.7)
Neutrophils Relative %: 59 %
Platelets: 315 10*3/uL (ref 150–400)
RBC: 4.49 MIL/uL (ref 3.87–5.11)
RDW: 13.6 % (ref 11.5–15.5)
WBC: 5.3 10*3/uL (ref 4.0–10.5)
nRBC: 0 % (ref 0.0–0.2)

## 2022-01-17 LAB — BRAIN NATRIURETIC PEPTIDE: B Natriuretic Peptide: 115.8 pg/mL — ABNORMAL HIGH (ref 0.0–100.0)

## 2022-01-17 LAB — TROPONIN I (HIGH SENSITIVITY)
Troponin I (High Sensitivity): 4 ng/L (ref <18)
Troponin I (High Sensitivity): 4 ng/L (ref <18)

## 2022-01-17 MED ORDER — IOHEXOL 350 MG/ML SOLN
75.0000 mL | Freq: Once | INTRAVENOUS | Status: AC | PRN
  Administered 2022-01-17: 75 mL via INTRAVENOUS

## 2022-01-17 MED ORDER — FUROSEMIDE 10 MG/ML IJ SOLN
20.0000 mg | Freq: Once | INTRAMUSCULAR | Status: AC
  Administered 2022-01-17: 20 mg via INTRAVENOUS
  Filled 2022-01-17: qty 2

## 2022-01-17 NOTE — ED Notes (Signed)
Patient ambulated with pulse ox. SAT 100%, HR 76. SOB noted. MD aware

## 2022-01-17 NOTE — ED Notes (Signed)
Ambulated to BR, gait steady 

## 2022-01-17 NOTE — ED Triage Notes (Signed)
Arrived POV; c/o shortness of breathe even at rest, denies any cough. Progressively gotten worst over the weekend.

## 2022-01-17 NOTE — Telephone Encounter (Signed)
Patient reports exertional SOB for the past month that has worsened. Noted her sob over phone. She denies chest pain, dizziness, and lightheadedness. While on phone, her BP is 163/88, P 74. She stated her PCP discontinued her amlodipine because of foot edema and gave her diuretic that didn't work. PCP also changed her carvedilol from 6.25mg  to 25mg  BID. She still takes valsartan 320mg  at night. Both feet edematous, left more than right. Gained 8 pounds since January. Dr. (DOD) advised of notes and patient scheduled with Dr. February  7/7. Dr. Herbie Baltimore recommended patient go to urgent care to be evaluated. Patient stated she will have her sister take her. She will keep 7/7 appointment with Dr. Allyson Sabal.

## 2022-01-17 NOTE — ED Notes (Signed)
Pt given cheese crackers and water.

## 2022-01-17 NOTE — ED Provider Notes (Signed)
Emergency Department Provider Note   I have reviewed the triage vital signs and the nursing notes.   HISTORY  Chief Complaint Shortness of Breath   HPI [name] [name] is a 76 y.o. female past medical history reviewed below presents to the emergency department with shortness of breath.  Symptoms have been present for the last 30 days but worsening over the past week.  Patient states when symptoms began she had some tightness in her chest and took 2 nitroglycerin, and pain resolved.  She states that because the nitroglycerin improved the pain she did not seek care.  She has since been short of breath without chest discomfort.  She has worsening symptoms with lying flat and has noticed some intermittent swelling in the legs, worse on the left. No fever or chills. No cough.    Past Medical History:  Diagnosis Date   Arthritis    "knees, hips, hands, back" (09/15/2016)   Constipation    Coronary artery disease    NSTEMI-PCI March 2018   Exercise-induced asthma    Family history of adverse reaction to anesthesia    "daughter gets PONV too"   GERD (gastroesophageal reflux disease)    Heart murmur dx'd 09/14/2016   High cholesterol    Hypertension    Migraine    "recently had series of injections; none since; they had been weekly 1 month ago" (09/15/2016)   Necrotizing fasciitis (HCC)    R index finger- had a amp   NSTEMI (non-ST elevated myocardial infarction) (HCC)    Pneumonia ~ 2003   PONV (postoperative nausea and vomiting)    Patch helps   RSD (reflex sympathetic dystrophy)    Right arm after surgery    Review of Systems  Constitutional: No fever/chills Eyes: No visual changes. ENT: No sore throat. Cardiovascular: Denies chest pain. Positive intermittent leg swelling.  Respiratory: Positive shortness of breath. Gastrointestinal: No abdominal pain.  No nausea, no vomiting.  No diarrhea.  No constipation. Genitourinary: Negative for dysuria. Musculoskeletal: Negative  for back pain. Skin: Negative for rash. Neurological: Negative for headaches, focal weakness or numbness.  ____________________________________________   PHYSICAL EXAM:  VITAL SIGNS: ED Triage Vitals  Enc Vitals Group     BP 01/17/22 1426 (!) 180/81     Pulse Rate 01/17/22 1426 67     Resp 01/17/22 1426 18     Temp 01/17/22 1426 98.3 F (36.8 C)     Temp Source 01/17/22 1426 Oral     SpO2 01/17/22 1426 98 %     Weight 01/17/22 1431 178 lb (80.7 kg)     Height 01/17/22 1431 5\' 5"  (1.651 m)   Constitutional: Alert and oriented. Well appearing and in no acute distress. Eyes: Conjunctivae are normal.  Head: Atraumatic. Nose: No congestion/rhinnorhea. Mouth/Throat: Mucous membranes are moist.  Oropharynx non-erythematous. Neck: No stridor.   Cardiovascular: Normal rate, regular rhythm. Good peripheral circulation. Grossly normal heart sounds.   Respiratory: Increased respiratory effort.  No retractions. Lungs CTAB. Gastrointestinal: Soft and nontender. No distention. Musculoskeletal: No lower extremity tenderness with 1+ pitting edema slightly worse on the left. No gross deformities of extremities. Neurologic:  Normal speech and language. No gross focal neurologic deficits are appreciated.  Skin:  Skin is warm, dry and intact. No rash noted.  ____________________________________________   LABS (all labs ordered are listed, but only abnormal results are displayed)  Labs Reviewed  COMPREHENSIVE METABOLIC PANEL - Abnormal; Notable for the following components:      Result  Value   Glucose, Bld 101 (*)    All other components within normal limits  BRAIN NATRIURETIC PEPTIDE - Abnormal; Notable for the following components:   B Natriuretic Peptide 115.8 (*)    All other components within normal limits  CBC WITH DIFFERENTIAL/PLATELET  TROPONIN I (HIGH SENSITIVITY)  TROPONIN I (HIGH SENSITIVITY)   ____________________________________________  EKG   EKG  Interpretation  Date/Time:  Monday January 17 2022 14:31:16 EDT Ventricular Rate:  67 PR Interval:  164 QRS Duration: 84 QT Interval:  392 QTC Calculation: 414 R Axis:   -18 Text Interpretation: Normal sinus rhythm Nonspecific ST and T wave abnormality Abnormal ECG When compared with ECG of 26-Apr-2019 06:01, PREVIOUS ECG IS PRESENT Confirmed by Nanda Quinton [id number]) on 01/17/2022 7:05:51 PM        ____________________________________________  RADIOLOGY  CT Angio Chest PE W and/or Wo Contrast  Result Date: 01/17/2022 CLINICAL DATA:  Suspected pulmonary embolism. EXAM: CT ANGIOGRAPHY CHEST WITH CONTRAST TECHNIQUE: Multidetector CT imaging of the chest was performed using the standard protocol during bolus administration of intravenous contrast. Multiplanar CT image reconstructions and MIPs were obtained to evaluate the vascular anatomy. RADIATION DOSE REDUCTION: This exam was performed according to the departmental dose-optimization program which includes automated exposure control, adjustment of the mA and/or kV according to patient size and/or use of iterative reconstruction technique. CONTRAST:  33mL OMNIPAQUE IOHEXOL 350 MG/ML SOLN COMPARISON:  None Available. FINDINGS: Cardiovascular: There is mild calcification of the aortic arch, without evidence of aortic aneurysm or dissection. Satisfactory opacification of the pulmonary arteries to the segmental level. No evidence of pulmonary embolism. Normal heart size. A coronary artery stent is in place. No pericardial effusion. Mediastinum/Nodes: No enlarged mediastinal, hilar, or axillary lymph nodes. Thyroid gland, trachea, and esophagus demonstrate no significant findings. Lungs/Pleura: An 8 mm noncalcified pulmonary nodule is seen within the lateral aspect of the left lower lobe (axial CT image 66, CT series 6). Very mild atelectatic changes are seen along the posterior aspect of the bilateral lower lobes. There is no evidence of acute infiltrate,  pleural effusion or pneumothorax. Upper Abdomen: No acute abnormality. Musculoskeletal: Extensive postoperative changes are seen within the visualized portion of the lumbar spine. Multilevel degenerative changes are noted throughout the thoracic spine and lower cervical spine. Review of the MIP images confirms the above findings. IMPRESSION: 1. No evidence of pulmonary embolism or other acute intrathoracic process. 2. 8 mm noncalcified left lower lobe pulmonary nodule. Non-contrast chest CT at 6-12 months is recommended. If the nodule is stable at time of repeat CT, then future CT at 18-24 months (from today's scan) is considered optional for low-risk patients, but is recommended for high-risk patients. This recommendation follows the consensus statement: Guidelines for Management of Incidental Pulmonary Nodules Detected on CT Images: From the Fleischner Society 2017; Radiology 2017; 284:228-243. Aortic Atherosclerosis (ICD10-I70.0). Electronically Signed   By: Virgina Norfolk M.D.   On: 01/17/2022 19:42   US Venous Img Lower  Left (DVT Study)  Result Date: 01/17/2022 CLINICAL DATA:  Leg swelling. EXAM: LEFT LOWER EXTREMITY VENOUS DOPPLER ULTRASOUND TECHNIQUE: Gray-scale sonography with compression, as well as color and duplex ultrasound, were performed to evaluate the deep venous system(s) from the level of the common femoral vein through the popliteal and proximal calf veins. COMPARISON:  None Available. FINDINGS: VENOUS Normal compressibility of the common femoral, superficial femoral, and popliteal veins, as well as the visualized calf veins. Visualized portions of profunda femoral vein and great saphenous vein unremarkable. No  filling defects to suggest DVT on grayscale or color Doppler imaging. Doppler waveforms show normal direction of venous flow, normal respiratory plasticity and response to augmentation. Limited views of the contralateral common femoral vein are unremarkable. OTHER None. Limitations:  none IMPRESSION: No evidence of left lower extremity DVT. Electronically Signed   By: Narda Rutherford M.D.   On: 01/17/2022 17:35   DG Chest 2 View  Result Date: 01/17/2022 CLINICAL DATA:  Shortness of breath. EXAM: CHEST - 2 VIEW COMPARISON:  Chest radiographs 09/14/2016 FINDINGS: The cardiac silhouette is borderline enlarged. There is chronic coarsening of the interstitial markings. No overt pulmonary edema, airspace consolidation, pleural effusion, or pneumothorax is identified. Prior lumbar fusion is noted. IMPRESSION: No active cardiopulmonary disease. Electronically Signed   By: Sebastian Ache M.D.   On: 01/17/2022 15:00    ____________________________________________   PROCEDURES  Procedure(s) performed:   Procedures  None  ____________________________________________   INITIAL IMPRESSION / ASSESSMENT AND PLAN / ED COURSE  Pertinent labs & imaging results that were available during my care of the patient were reviewed by me and considered in my medical decision making (see chart for details).   This patient is Presenting for Evaluation of SOB, which does require a range of treatment options, and is a complaint that involves a high risk of morbidity and mortality.  The Differential Diagnoses include CAP, PE, CHF exacerbation, CAD, COVID.   I did obtain Additional Historical Information from daughter at bedside.  I decided to review pertinent External Data, and in summary no prior ECHO.   Clinical Laboratory Tests Ordered, included BNP mildly elevated.  No acute kidney injury.  Troponin normal.  No anemia.  Radiologic Tests Ordered, included CXR, CTA, and DVT. I independently interpreted the images and agree with radiology interpretation.   Cardiac Monitor Tracing which shows NSR.   Social Determinants of Health Risk no smoking history.   Consult complete with   Hospitalist, Plan for admit.   Medical Decision Making: Summary:  Patient presents emergency department  shortness of breath.  She has orthopnea and exertional dyspnea but no hypoxemia.  No PE on CTA.  Mild elevation in BNP.  Reevaluation with update and discussion with patient and daughter.  Plan for admit for diuresis and echo.   Disposition: admit  ____________________________________________  FINAL CLINICAL IMPRESSION(S) / ED DIAGNOSES  Final diagnoses:  SOB (shortness of breath)    Note:  This document was prepared using Dragon voice recognition software and may include unintentional dictation errors.  Alona Bene, MD, [facility] Emergency Medicine    Hassel Uphoff, Arlyss Repress, MD 01/17/22 [id number]

## 2022-01-17 NOTE — ED Notes (Signed)
ED Provider at bedside. 

## 2022-01-17 NOTE — Telephone Encounter (Signed)
Pt c/o Shortness Of Breath: STAT if SOB developed within the last 24 hours or pt is noticeably SOB on the phone  1. Are you currently SOB (can you hear that pt is SOB on the phone)? No  2. How long have you been experiencing SOB?  Started last month but worsened last week  3. Are you SOB when sitting or when up moving around?   Moving around  4. Are you currently experiencing any other symptoms?   No  Patient is concern about shortness of breath.

## 2022-01-18 ENCOUNTER — Encounter (HOSPITAL_COMMUNITY): Payer: Self-pay | Admitting: Internal Medicine

## 2022-01-18 DIAGNOSIS — I509 Heart failure, unspecified: Secondary | ICD-10-CM | POA: Diagnosis not present

## 2022-01-18 DIAGNOSIS — Z955 Presence of coronary angioplasty implant and graft: Secondary | ICD-10-CM | POA: Diagnosis not present

## 2022-01-18 DIAGNOSIS — I5031 Acute diastolic (congestive) heart failure: Secondary | ICD-10-CM | POA: Diagnosis not present

## 2022-01-18 DIAGNOSIS — Z7902 Long term (current) use of antithrombotics/antiplatelets: Secondary | ICD-10-CM | POA: Diagnosis not present

## 2022-01-18 DIAGNOSIS — R0602 Shortness of breath: Secondary | ICD-10-CM | POA: Diagnosis present

## 2022-01-18 DIAGNOSIS — Z7982 Long term (current) use of aspirin: Secondary | ICD-10-CM | POA: Diagnosis not present

## 2022-01-18 DIAGNOSIS — I252 Old myocardial infarction: Secondary | ICD-10-CM | POA: Diagnosis not present

## 2022-01-18 DIAGNOSIS — Z20822 Contact with and (suspected) exposure to covid-19: Secondary | ICD-10-CM | POA: Diagnosis not present

## 2022-01-18 DIAGNOSIS — I2510 Atherosclerotic heart disease of native coronary artery without angina pectoris: Secondary | ICD-10-CM | POA: Diagnosis not present

## 2022-01-18 DIAGNOSIS — I110 Hypertensive heart disease with heart failure: Secondary | ICD-10-CM | POA: Diagnosis not present

## 2022-01-18 DIAGNOSIS — Z79899 Other long term (current) drug therapy: Secondary | ICD-10-CM | POA: Diagnosis not present

## 2022-01-18 DIAGNOSIS — I10 Essential (primary) hypertension: Secondary | ICD-10-CM | POA: Diagnosis not present

## 2022-01-18 MED ORDER — SODIUM CHLORIDE 0.9% FLUSH
3.0000 mL | Freq: Two times a day (BID) | INTRAVENOUS | Status: DC
Start: 2022-01-18 — End: 2022-01-21
  Administered 2022-01-18 – 2022-01-20 (×3): 3 mL via INTRAVENOUS

## 2022-01-18 MED ORDER — ALBUTEROL SULFATE HFA 108 (90 BASE) MCG/ACT IN AERS: 2.0000 | INHALATION_SPRAY | Freq: Four times a day (QID) | RESPIRATORY_TRACT | Status: DC | PRN

## 2022-01-18 MED ORDER — ONDANSETRON HCL 4 MG/2ML IJ SOLN: 4.0000 mg | Freq: Four times a day (QID) | INTRAMUSCULAR | Status: DC | PRN

## 2022-01-18 MED ORDER — LORATADINE 10 MG PO TABS
10.0000 mg | ORAL_TABLET | Freq: Every day | ORAL | Status: DC
  Administered 2022-01-18: 10 mg via ORAL
  Filled 2022-01-18 (×4): qty 1

## 2022-01-18 MED ORDER — HYDROCHLOROTHIAZIDE 12.5 MG PO TABS
12.5000 mg | ORAL_TABLET | Freq: Every day | ORAL | Status: DC
  Administered 2022-01-18 – 2022-01-21 (×3): 12.5 mg via ORAL
  Filled 2022-01-18 (×3): qty 1

## 2022-01-18 MED ORDER — ACETAMINOPHEN 500 MG PO TABS
1000.0000 mg | ORAL_TABLET | Freq: Once | ORAL | Status: AC
Start: 2022-01-18 — End: 2022-01-18
  Administered 2022-01-18: 1000 mg via ORAL
  Filled 2022-01-18: qty 2

## 2022-01-18 MED ORDER — PANTOPRAZOLE SODIUM 40 MG PO TBEC
40.0000 mg | DELAYED_RELEASE_TABLET | Freq: Every day | ORAL | Status: DC
  Administered 2022-01-18 – 2022-01-21 (×4): 40 mg via ORAL
  Filled 2022-01-18 (×4): qty 1

## 2022-01-18 MED ORDER — CLOPIDOGREL BISULFATE 75 MG PO TABS
75.0000 mg | ORAL_TABLET | Freq: Every day | ORAL | Status: DC
  Administered 2022-01-18 – 2022-01-20 (×3): 75 mg via ORAL
  Filled 2022-01-18 (×4): qty 1

## 2022-01-18 MED ORDER — CARVEDILOL 25 MG PO TABS
25.0000 mg | ORAL_TABLET | Freq: Two times a day (BID) | ORAL | Status: DC
  Administered 2022-01-18 – 2022-01-21 (×5): 25 mg via ORAL
  Filled 2022-01-18 (×5): qty 1

## 2022-01-18 MED ORDER — NORTRIPTYLINE HCL 25 MG PO CAPS
25.0000 mg | ORAL_CAPSULE | Freq: Two times a day (BID) | ORAL | Status: DC
  Administered 2022-01-18 – 2022-01-21 (×5): 25 mg via ORAL
  Filled 2022-01-18 (×7): qty 1

## 2022-01-18 MED ORDER — FLUTICASONE PROPIONATE 50 MCG/ACT NA SUSP: 1.0000 | Freq: Every day | NASAL | Status: DC | PRN

## 2022-01-18 MED ORDER — LEVOCETIRIZINE DIHYDROCHLORIDE 5 MG PO TABS: 5.0000 mg | ORAL_TABLET | Freq: Every evening | ORAL | Status: DC

## 2022-01-18 MED ORDER — SERTRALINE HCL 100 MG PO TABS
100.0000 mg | ORAL_TABLET | Freq: Every day | ORAL | Status: DC
  Administered 2022-01-18 – 2022-01-21 (×4): 100 mg via ORAL
  Filled 2022-01-18 (×4): qty 1

## 2022-01-18 MED ORDER — ACETAMINOPHEN 325 MG PO TABS
650.0000 mg | ORAL_TABLET | Freq: Once | ORAL | Status: AC
  Administered 2022-01-18: 650 mg via ORAL
  Filled 2022-01-18: qty 2

## 2022-01-18 MED ORDER — ACETAMINOPHEN 325 MG PO TABS: 650.0000 mg | ORAL_TABLET | ORAL | Status: DC | PRN

## 2022-01-18 MED ORDER — SODIUM CHLORIDE 0.9 % IV SOLN
250.0000 mL | INTRAVENOUS | Status: DC | PRN
Start: 2022-01-18 — End: 2022-01-21

## 2022-01-18 MED ORDER — SODIUM CHLORIDE 0.9% FLUSH: 3.0000 mL | INTRAVENOUS | Status: DC | PRN

## 2022-01-18 MED ORDER — IRBESARTAN 150 MG PO TABS
300.0000 mg | ORAL_TABLET | Freq: Every day | ORAL | Status: DC
  Administered 2022-01-18 – 2022-01-20 (×3): 300 mg via ORAL
  Filled 2022-01-18: qty 2
  Filled 2022-01-18 (×2): qty 1

## 2022-01-18 MED ORDER — ERENUMAB-AOOE 70 MG/ML ~~LOC~~ SOAJ
70.0000 mg | SUBCUTANEOUS | Status: DC
Start: 2022-01-18 — End: 2022-01-18

## 2022-01-18 MED ORDER — ENOXAPARIN SODIUM 40 MG/0.4ML IJ SOSY
40.0000 mg | PREFILLED_SYRINGE | Freq: Every day | INTRAMUSCULAR | Status: DC
  Administered 2022-01-18 – 2022-01-20 (×3): 40 mg via SUBCUTANEOUS
  Filled 2022-01-18 (×3): qty 0.4

## 2022-01-18 MED ORDER — ASPIRIN 81 MG PO TBEC
81.0000 mg | DELAYED_RELEASE_TABLET | Freq: Every day | ORAL | Status: DC
  Administered 2022-01-18 – 2022-01-21 (×3): 81 mg via ORAL
  Filled 2022-01-18 (×3): qty 1

## 2022-01-18 MED ORDER — ACETAMINOPHEN 500 MG PO TABS
500.0000 mg | ORAL_TABLET | Freq: Four times a day (QID) | ORAL | Status: DC | PRN
Start: 2022-01-18 — End: 2022-01-18

## 2022-01-18 NOTE — H&P (Signed)
History and Physical    [name] [name] [id number] DOB: [birth year] DOA: 01/17/2022  PCP: Bernerd Limbo, MD (Confirm with patient/family/NH records and if not entered, this has to be entered at [facility] point of entry) Patient coming from: Home  I have personally briefly reviewed patient's old medical records in [place]  Chief Complaint: SOB, leg swelling  HPI: [name] [name] is a 76 y.o. female with medical history significant of family and CAD with stenting, HTN, HLD, OSA on CPAP at bedtime, anxiety/depression, presented with increasing shortness of breath and leg swelling.  Patient reported that poorly controlled hypertension recently, for which she has been following with cardiology Dr. Alvester Chou and recently her blood pressure medication has been titrated up to 2 times including 1 time last months.  Despite, patient check her blood pressure occasionally and noticed uncontrolled hypertension this week systolic pressure more than 200 several times.  And increasingly she started to feel exertional dyspnea and leg swelling, she reported the leg swellings happen more toward the end of the day.  And her exertional tolerance has significant decreased.  Denies any cough.  Last week, she developed several episodes of pressure-like chest pain for which she took nitroglycerin and chest pain dissipated.  She gained about 5 pounds over last 3 months.  ED Course: Blood pressure significant elevated, no hypoxia no tachycardia.  CT angiogram negative for PE, DVT study negative.  Patient was given 20 mg of Lasix and her breathing significantly improved.  Review of Systems: As per HPI otherwise 14 point review of systems negative.    Past Medical History:  Diagnosis Date   Arthritis    "knees, hips, hands, back" (09/15/2016)   Constipation    Coronary artery disease    NSTEMI-PCI March 2018   Exercise-induced asthma    Family history of adverse reaction to anesthesia    "daughter gets PONV too"    GERD (gastroesophageal reflux disease)    Heart murmur dx'd 09/14/2016   High cholesterol    Hypertension    Migraine    "recently had series of injections; none since; they had been weekly 1 month ago" (09/15/2016)   Necrotizing fasciitis (HCC)    R index finger- had a amp   NSTEMI (non-ST elevated myocardial infarction) ([place])    Pneumonia ~ 2003   PONV (postoperative nausea and vomiting)    Patch helps   RSD (reflex sympathetic dystrophy)    Right arm after surgery    Past Surgical History:  Procedure Laterality Date   ANTERIOR LATERAL LUMBAR FUSION 4 LEVELS Right 09/20/2012   Procedure: ANTERIOR LATERAL LUMBAR FUSION 4 LEVELS;  Surgeon: Erline Levine, MD;  Location: [facility];  Service: Neurosurgery;  Laterality: Right;  Right L1-2 L2-3 L3-4 L4-5 Anterolateral decompression/fusion/with L5-S1 Posterior lumbar interbody fusion/Percutaneous pedicle screws L1-S1   BACK SURGERY     CATARACT EXTRACTION W/ INTRAOCULAR LENS  IMPLANT, BILATERAL Bilateral 2017   CORONARY ANGIOPLASTY  05/14/2018   CORONARY ANGIOPLASTY WITH STENT PLACEMENT     CORONARY BALLOON ANGIOPLASTY N/A 05/14/2018   Procedure: CORONARY BALLOON ANGIOPLASTY;  Surgeon: Lorretta Harp, MD;  Location: [facility];  Service: Cardiovascular;  Laterality: N/A;   CORONARY BALLOON ANGIOPLASTY N/A 04/25/2019   Procedure: CORONARY BALLOON ANGIOPLASTY;  Surgeon: Lorretta Harp, MD;  Location: [facility];  Service: Cardiovascular;  Laterality: N/A;  diagonal 1st   CORONARY STENT INTERVENTION N/A 09/15/2016   Procedure: Coronary Stent Intervention;  Surgeon: Charolette Forward, MD;  Location: [facility];  Service: Cardiovascular;  Laterality: N/A;   CORONARY STENT INTERVENTION N/A 05/14/2018   Procedure: CORONARY STENT INTERVENTION;  Surgeon: Lorretta Harp, MD;  Location: [facility] [facility];  Service: Cardiovascular;  Laterality: N/A;   FINGER AMPUTATION Right 2008   Index finger   INTRAVASCULAR PRESSURE  WIRE/FFR STUDY N/A 09/15/2016   Procedure: Intravascular Pressure Wire/FFR Study;  Surgeon: Charolette Forward, MD;  Location: [place] [facility];  Service: Cardiovascular;  Laterality: N/A;   LEFT HEART CATH AND CORONARY ANGIOGRAPHY N/A 09/15/2016   Procedure: Left Heart Cath and Coronary Angiography;  Surgeon: Charolette Forward, MD;  Location: [place];  Service: Cardiovascular;  Laterality: N/A;   LEFT HEART CATH AND CORONARY ANGIOGRAPHY N/A 05/14/2018   Procedure: LEFT HEART CATH AND CORONARY ANGIOGRAPHY;  Surgeon: Lorretta Harp, MD;  Location: [place] [facility];  Service: Cardiovascular;  Laterality: N/A;   LEFT HEART CATH AND CORONARY ANGIOGRAPHY N/A 04/25/2019   Procedure: LEFT HEART CATH AND CORONARY ANGIOGRAPHY;  Surgeon: Lorretta Harp, MD;  Location: [place] [facility];  Service: Cardiovascular;  Laterality: N/A;   TONSILLECTOMY       reports that she has never smoked. She has never used smokeless tobacco. She reports that she does not drink alcohol and does not use drugs.  Allergies  Allergen Reactions   Codeine Anaphylaxis   Garlic Anaphylaxis   Morphine And Related Nausea And Vomiting   Atorvastatin Other (See Comments)    Muscle aches   Pravastatin Sodium Other (See Comments)    Muscle pain    Family History  Problem Relation Age of Onset   Heart failure Mother    Cancer Mother 61       Breast   Kidney failure Father    Brain cancer Sister      Prior to Admission medications   Medication Sig Start Date End Date Taking? Authorizing Provider  acetaminophen (TYLENOL) 500 MG tablet Take 500-650 mg by mouth every 6 (six) hours as needed for headache or moderate pain.   Yes [provider]  AIMOVIG 70 MG/ML SOAJ Inject 70 mg into the skin every 30 (thirty) days. 10/15/18  Yes [provider]  albuterol (PROVENTIL HFA;VENTOLIN HFA) 108 (90 Base) MCG/ACT inhaler Inhale 2 puffs into the lungs 4 (four) times daily as needed for wheezing or shortness of  breath. 07/26/13  Yes [provider]  aspirin EC 81 MG tablet Take 81 mg by mouth daily.   Yes [provider]  carvedilol (COREG) 25 MG tablet Take 25 mg by mouth 2 (two) times daily with a meal.   Yes [provider]  clopidogrel (PLAVIX) 75 MG tablet TAKE 1 TABLET BY MOUTH DAILY. Patient taking differently: Take 75 mg by mouth daily. 08/13/21  Yes Lorretta Harp, MD  fluticasone (FLONASE) 50 MCG/ACT nasal spray Place 1-2 sprays into both nostrils daily as needed for allergies.   Yes [provider]  levocetirizine (XYZAL) 5 MG tablet Take 5 mg by mouth every evening.   Yes [provider]  nitroGLYCERIN (NITROSTAT) 0.4 MG SL tablet Place 1 tablet (0.4 mg total) under the tongue every 5 (five) minutes x 3 doses as needed. Patient taking differently: Place 0.4 mg under the tongue every 5 (five) minutes x 3 doses as needed for chest pain. 02/22/18  Yes Lorretta Harp, MD  nortriptyline (PAMELOR) 25 MG capsule Take 25 mg by mouth 2 (two) times daily.  Yes [provider]  pantoprazole (PROTONIX) 40 MG tablet Take 1 tablet (40 mg total) by mouth daily. 05/15/18  Yes Cheryln Manly, NP  Propylene Glycol (SYSTANE COMPLETE OP) Place 1 drop into both eyes daily as needed (dry eyes).   Yes [provider]  REPATHA SURECLICK [id number] MG/ML SOAJ INJECT 140 MG INTO THE SKIN EVERY 14 DAYS. Patient taking differently: Inject 140 mg into the skin every 14 (fourteen) days. 10/21/21  Yes Lorretta Harp, MD  sertraline (ZOLOFT) 100 MG tablet Take 100 mg by mouth daily. 10/19/21  Yes [provider]  valsartan (DIOVAN) 320 MG tablet Take 1 tablet (320 mg total) by mouth daily. 08/18/21  Yes Lorretta Harp, MD  Vitamin D, Ergocalciferol, (DRISDOL) 1.25 MG (50000 UNIT) CAPS capsule Take 50,000 Units by mouth once a week. 07/07/21  Yes [provider]  amLODipine (NORVASC) 5 MG tablet Take 1 tablet (5 mg total) by mouth daily. Patient  not taking: Reported on 01/18/2022 08/26/21   Lorretta Harp, MD  carvedilol (COREG) 6.25 MG tablet Take 1 tablet (6.25 mg total) by mouth 2 (two) times daily. Patient not taking: Reported on 01/18/2022 09/16/21   Lorretta Harp, MD    Physical Exam: Vitals:   01/18/22 1500 01/18/22 1522 01/18/22 1525 01/18/22 1551  BP:  (!) 155/87  (!) 155/87  Pulse:  69  70  Resp:  18  17  Temp: (!) 97.4 F (36.3 C)   (!) 97.5 F (36.4 C)  TempSrc: Oral   Oral  SpO2:  99%    Weight:   78.5 kg   Height:    5\' 5"  (1.651 m)    Constitutional: NAD, calm, comfortable Vitals:   01/18/22 1500 01/18/22 1522 01/18/22 1525 01/18/22 1551  BP:  (!) 155/87  (!) 155/87  Pulse:  69  70  Resp:  18  17  Temp: (!) 97.4 F (36.3 C)   (!) 97.5 F (36.4 C)  TempSrc: Oral   Oral  SpO2:  99%    Weight:   78.5 kg   Height:    5\' 5"  (1.651 m)   Eyes: PERRL, lids and conjunctivae normal ENMT: Mucous membranes are moist. Posterior pharynx clear of any exudate or lesions.Normal dentition.  Neck: normal, supple, no masses, no thyromegaly Respiratory: clear to auscultation bilaterally, no wheezing, no crackles. Normal respiratory effort. No accessory muscle use.  Cardiovascular: Regular rate and rhythm, no murmurs / rubs / gallops.  1+ extremity edema. 2+ pedal pulses. No carotid bruits.  Abdomen: no tenderness, no masses palpated. No hepatosplenomegaly. Bowel sounds positive.  Musculoskeletal: no clubbing / cyanosis. No joint deformity upper and lower extremities. Good ROM, no contractures. Normal muscle tone.  Skin: no rashes, lesions, ulcers. No induration Neurologic: CN 2-12 grossly intact. Sensation intact, DTR normal. Strength 5/5 in all 4.  Psychiatric: Normal judgment and insight. Alert and oriented x 3. Normal mood.     Labs on Admission: I have personally reviewed following labs and imaging studies  CBC: Recent Labs  Lab 01/17/22 1434  WBC 5.3  NEUTROABS 3.1  HGB 12.9  HCT 38.5  MCV 85.7  PLT  123456   Basic Metabolic Panel: Recent Labs  Lab 01/17/22 1434  NA 139  K 4.1  CL 107  CO2 25  GLUCOSE 101*  BUN 10  CREATININE 0.71  CALCIUM 9.7   GFR: Estimated Creatinine Clearance: 62 mL/min (by C-G formula based on SCr of 0.71 mg/dL). Liver Function Tests:  Recent Labs  Lab 01/17/22 1434  AST 19  ALT 15  ALKPHOS 64  BILITOT 0.5  PROT 6.7  ALBUMIN 3.5   No results for input(s): "LIPASE", "AMYLASE" in the last 168 hours. No results for input(s): "AMMONIA" in the last 168 hours. Coagulation Profile: No results for input(s): "INR", "PROTIME" in the last 168 hours. Cardiac Enzymes: No results for input(s): "CKTOTAL", "CKMB", "CKMBINDEX", "TROPONINI" in the last 168 hours. BNP (last 3 results) No results for input(s): "PROBNP" in the last 8760 hours. HbA1C: No results for input(s): "HGBA1C" in the last 72 hours. CBG: No results for input(s): "GLUCAP" in the last 168 hours. Lipid Profile: No results for input(s): "CHOL", "HDL", "LDLCALC", "TRIG", "CHOLHDL", "LDLDIRECT" in the last 72 hours. Thyroid Function Tests: No results for input(s): "TSH", "T4TOTAL", "FREET4", "T3FREE", "THYROIDAB" in the last 72 hours. Anemia Panel: No results for input(s): "VITAMINB12", "FOLATE", "FERRITIN", "TIBC", "IRON", "RETICCTPCT" in the last 72 hours. Urine analysis: No results found for: "COLORURINE", "APPEARANCEUR", "LABSPEC", "PHURINE", "GLUCOSEU", "HGBUR", "BILIRUBINUR", "KETONESUR", "PROTEINUR", "UROBILINOGEN", "NITRITE", "LEUKOCYTESUR"  Radiological Exams on Admission: CT Angio Chest PE W and/or Wo Contrast  Result Date: 01/17/2022 CLINICAL DATA:  Suspected pulmonary embolism. EXAM: CT ANGIOGRAPHY CHEST WITH CONTRAST TECHNIQUE: Multidetector CT imaging of the chest was performed using the standard protocol during bolus administration of intravenous contrast. Multiplanar CT image reconstructions and MIPs were obtained to evaluate the vascular anatomy. RADIATION DOSE REDUCTION: This  exam was performed according to the departmental dose-optimization program which includes automated exposure control, adjustment of the mA and/or kV according to patient size and/or use of iterative reconstruction technique. CONTRAST:  58mL OMNIPAQUE IOHEXOL 350 MG/ML SOLN COMPARISON:  None Available. FINDINGS: Cardiovascular: There is mild calcification of the aortic arch, without evidence of aortic aneurysm or dissection. Satisfactory opacification of the pulmonary arteries to the segmental level. No evidence of pulmonary embolism. Normal heart size. A coronary artery stent is in place. No pericardial effusion. Mediastinum/Nodes: No enlarged mediastinal, hilar, or axillary lymph nodes. Thyroid gland, trachea, and esophagus demonstrate no significant findings. Lungs/Pleura: An 8 mm noncalcified pulmonary nodule is seen within the lateral aspect of the left lower lobe (axial CT image 66, CT series 6). Very mild atelectatic changes are seen along the posterior aspect of the bilateral lower lobes. There is no evidence of acute infiltrate, pleural effusion or pneumothorax. Upper Abdomen: No acute abnormality. Musculoskeletal: Extensive postoperative changes are seen within the visualized portion of the lumbar spine. Multilevel degenerative changes are noted throughout the thoracic spine and lower cervical spine. Review of the MIP images confirms the above findings. IMPRESSION: 1. No evidence of pulmonary embolism or other acute intrathoracic process. 2. 8 mm noncalcified left lower lobe pulmonary nodule. Non-contrast chest CT at 6-12 months is recommended. If the nodule is stable at time of repeat CT, then future CT at 18-24 months (from today's scan) is considered optional for low-risk patients, but is recommended for high-risk patients. This recommendation follows the consensus statement: Guidelines for Management of Incidental Pulmonary Nodules Detected on CT Images: From the Fleischner Society 2017; Radiology 2017;  [id number]. Aortic Atherosclerosis (ICD10-I70.0). Electronically Signed   By: Aram Candela M.D.   On: 01/17/2022 19:42   US Venous Img Lower  Left (DVT Study)  Result Date: 01/17/2022 CLINICAL DATA:  Leg swelling. EXAM: LEFT LOWER EXTREMITY VENOUS DOPPLER ULTRASOUND TECHNIQUE: Gray-scale sonography with compression, as well as color and duplex ultrasound, were performed to evaluate the deep venous system(s) from the level of the common femoral vein  through the popliteal and proximal calf veins. COMPARISON:  None Available. FINDINGS: VENOUS Normal compressibility of the common femoral, superficial femoral, and popliteal veins, as well as the visualized calf veins. Visualized portions of profunda femoral vein and great saphenous vein unremarkable. No filling defects to suggest DVT on grayscale or color Doppler imaging. Doppler waveforms show normal direction of venous flow, normal respiratory plasticity and response to augmentation. Limited views of the contralateral common femoral vein are unremarkable. OTHER None. Limitations: none IMPRESSION: No evidence of left lower extremity DVT. Electronically Signed   By: Narda Rutherford M.D.   On: 01/17/2022 17:35   DG Chest 2 View  Result Date: 01/17/2022 CLINICAL DATA:  Shortness of breath. EXAM: CHEST - 2 VIEW COMPARISON:  Chest radiographs 09/14/2016 FINDINGS: The cardiac silhouette is borderline enlarged. There is chronic coarsening of the interstitial markings. No overt pulmonary edema, airspace consolidation, pleural effusion, or pneumothorax is identified. Prior lumbar fusion is noted. IMPRESSION: No active cardiopulmonary disease. Electronically Signed   By: Sebastian Ache M.D.   On: 01/17/2022 15:00    EKG: Independently reviewed.  Sinus, no acute ST changes.  Assessment/Plan Principal Problem:   Acute CHF (HCC) Active Problems:   CHF (congestive heart failure) (HCC)  (please populate well all problems here in Problem List. (For example, if  patient is on BP meds at home and you resume or decide to hold them, it is a problem that needs to be her. Same for CAD, COPD, HLD and so on)   New onset of CHF -Patient has significant symptoms or signs of fluid overload, which responded to 20 mg IV Lasix in the ED.  Currently, patient is euvolemic, but blood pressure still remains uncontrolled.  Clinically suspect diastolic CHF, probably acute on chronic.  Indication for more stringent BP control. -Discussed with on-call cardiology Dr. Shari Prows, cardiology will follow patient in the morning. -Continue Coreg 25 mg twice daily, continue valsartan 320 mg daily, add HCTZ 12.5 mg. -Echocardiogram, daily BMP.  CTA and DVT study negative.  Chest pain -ACS ruled out, likely from demand ischemia from CHF decompensation/uncontrolled hypertension -Echocardiogram, cardiology will see patient tomorrow.  HTN, uncontrolled -As above.  OSA -CPAP at bedtime  CAD -Continue aspirin Plavix and Repatha  Anxiety/depression -Continue SSRI.  DVT prophylaxis: Lovenox Code Status: Full code Family Communication: Daughter at bedside Disposition Plan: Expect less than 2 midnight hospital stay. Consults called: Cardiology Admission status: MedSurg observation   Emeline General MD [facility] Pager [phone]  01/18/2022, 5:57 PM

## 2022-01-18 NOTE — ED Notes (Signed)
[facility] into transport 

## 2022-01-18 NOTE — ED Notes (Signed)
[facility] enroute to [facility] at this time

## 2022-01-18 NOTE — ED Notes (Signed)
Sbar completed for review .Report sent via secure chat

## 2022-01-18 NOTE — Plan of Care (Signed)

## 2022-01-18 NOTE — ED Notes (Signed)
ED TO INPATIENT HANDOFF REPORT  ED Nurse Name and Phone #: Renaldo Reel Name/Age/Gender Starling Manns 76 y.o. female Room/Bed: MH02/MH02  Code Status   Code Status: Prior  Home/SNF/Other Home Patient oriented to: self, place, time, and situation Is this baseline? Yes   Triage Complete: Triage complete  Chief Complaint Acute CHF ([place]) [I50.9]  Triage Note Arrived POV; c/o shortness of breathe even at rest, denies any cough. Progressively gotten worst over the weekend.   Allergies Allergies  Allergen Reactions   Codeine Anaphylaxis   Garlic Anaphylaxis   Morphine And Related Nausea And Vomiting   Atorvastatin Other (See Comments)    Muscle aches   Pravastatin Sodium Other (See Comments)    Muscle pain    Level of Care/Admitting Diagnosis ED Disposition     ED Disposition  Admit   Condition  --   Comment  Hospital Area: [place] [[id number]]  Level of Care: Telemetry [5]  Admit to tele based on following criteria: Monitor for Ischemic changes  Interfacility transfer: Yes  May place patient in observation at [facility] or [place] if equivalent level of care is available:: No  Covid Evaluation: Asymptomatic - no recent exposure (last 10 days) testing not required  Diagnosis: Acute CHF [facility]) [id number]  Admitting Physician: Vernelle Emerald [id number]  Attending Physician: Vernelle Emerald [id number]          B Medical/Surgery History Past Medical History:  Diagnosis Date   Arthritis    "knees, hips, hands, back" (09/15/2016)   Constipation    Coronary artery disease    NSTEMI-PCI March 2018   Exercise-induced asthma    Family history of adverse reaction to anesthesia    "daughter gets PONV too"   GERD (gastroesophageal reflux disease)    Heart murmur dx'd 09/14/2016   High cholesterol    Hypertension    Migraine    "recently had series of injections; none since; they had been weekly 1 month ago" (09/15/2016)    Necrotizing fasciitis ([place])    R index finger- had a amp   NSTEMI (non-ST elevated myocardial infarction) ([place])    Pneumonia ~ 2003   PONV (postoperative nausea and vomiting)    Patch helps   RSD (reflex sympathetic dystrophy)    Right arm after surgery   Past Surgical History:  Procedure Laterality Date   ANTERIOR LATERAL LUMBAR FUSION 4 LEVELS Right 09/20/2012   Procedure: ANTERIOR LATERAL LUMBAR FUSION 4 LEVELS;  Surgeon: Erline Levine, MD;  Location: [place] [facility] OR[facility];  Service: Neurosurgery;  Laterality: Right;  Right L1-2 L2-3 L3-4 L4-5 Anterolateral decompression/fusion/with L5-S1 Posterior lumbar interbody fusion/Percutaneous pedicle screws L1-S1   BACK SURGERY     CATARACT EXTRACTION W/ INTRAOCULAR LENS  IMPLANT, BILATERAL Bilateral 2017   CORONARY ANGIOPLASTY  05/14/2018   CORONARY ANGIOPLASTY WITH STENT PLACEMENT     CORONARY BALLOON ANGIOPLASTY N/A 05/14/2018   Procedure: CORONARY BALLOON ANGIOPLASTY;  Surgeon: Lorretta Harp, MD;  Location: [place] CV LAB;  Service: Cardiovascular;  Laterality: N/A;   CORONARY BALLOON ANGIOPLASTY N/A 04/25/2019   Procedure: CORONARY BALLOON ANGIOPLASTY;  Surgeon: Lorretta Harp, MD;  Location: [place] CV LAB;  Service: Cardiovascular;  Laterality: N/A;  diagonal 1st   CORONARY STENT INTERVENTION N/A 09/15/2016   Procedure: Coronary Stent Intervention;  Surgeon: Charolette Forward, MD;  Location: [place] CV LAB;  Service: Cardiovascular;  Laterality: N/A;   CORONARY STENT INTERVENTION N/A 05/14/2018   Procedure: CORONARY STENT INTERVENTION;  Surgeon: Runell Gess, MD;  Location: [facility] INVASIVE CV LAB;  Service: Cardiovascular;  Laterality: N/A;   FINGER AMPUTATION Right 2008   Index finger   INTRAVASCULAR PRESSURE WIRE/FFR STUDY N/A 09/15/2016   Procedure: Intravascular Pressure Wire/FFR Study;  Surgeon: Rinaldo Cloud, MD;  Location: [facility] INVASIVE CV LAB;  Service: Cardiovascular;  Laterality: N/A;   LEFT HEART CATH AND CORONARY ANGIOGRAPHY  N/A 09/15/2016   Procedure: Left Heart Cath and Coronary Angiography;  Surgeon: Rinaldo Cloud, MD;  Location: [facility] INVASIVE CV LAB;  Service: Cardiovascular;  Laterality: N/A;   LEFT HEART CATH AND CORONARY ANGIOGRAPHY N/A 05/14/2018   Procedure: LEFT HEART CATH AND CORONARY ANGIOGRAPHY;  Surgeon: Runell Gess, MD;  Location: [facility] INVASIVE CV LAB;  Service: Cardiovascular;  Laterality: N/A;   LEFT HEART CATH AND CORONARY ANGIOGRAPHY N/A 04/25/2019   Procedure: LEFT HEART CATH AND CORONARY ANGIOGRAPHY;  Surgeon: Runell Gess, MD;  Location: [facility] INVASIVE CV LAB;  Service: Cardiovascular;  Laterality: N/A;   TONSILLECTOMY       A IV Location/Drains/Wounds Patient Lines/Drains/Airways Status     Active Line/Drains/Airways     Name Placement date Placement time Site Days   Peripheral IV 01/17/22 20 G Anterior;Left Hand 01/17/22  2205  Hand  1            Intake/Output Last 24 hours No intake or output data in the 24 hours ending 01/18/22 1411  Labs/Imaging Results for orders placed or performed during the hospital encounter of 01/17/22 (from the past 48 hour(s))  CBC with Differential     Status: None   Collection Time: 01/17/22  2:34 PM  Result Value Ref Range   WBC 5.3 4.0 - 10.5 K/uL   RBC 4.49 3.87 - 5.11 MIL/uL   Hemoglobin 12.9 12.0 - 15.0 g/dL   HCT 26.7 12.4 - 58.0 %   MCV 85.7 80.0 - 100.0 fL   MCH 28.7 26.0 - 34.0 pg   MCHC 33.5 30.0 - 36.0 g/dL   RDW 99.8 33.8 - 25.0 %   Platelets 315 150 - 400 K/uL   nRBC 0.0 0.0 - 0.2 %   Neutrophils Relative % 59 %   Neutro Abs 3.1 1.7 - 7.7 K/uL   Lymphocytes Relative 27 %   Lymphs Abs 1.4 0.7 - 4.0 K/uL   Monocytes Relative 7 %   Monocytes Absolute 0.4 0.1 - 1.0 K/uL   Eosinophils Relative 5 %   Eosinophils Absolute 0.3 0.0 - 0.5 K/uL   Basophils Relative 1 %   Basophils Absolute 0.0 0.0 - 0.1 K/uL   Immature Granulocytes 1 %   Abs Immature Granulocytes 0.06 0.00 - 0.07 K/uL    Comment: Performed at [facility], [address] [place], [place] [address]  Comprehensive metabolic panel     Status: Abnormal   Collection Time: 01/17/22  2:34 PM  Result Value Ref Range   Sodium 139 135 - 145 mmol/L   Potassium 4.1 3.5 - 5.1 mmol/L   Chloride 107 98 - 111 mmol/L   CO2 25 22 - 32 mmol/L   Glucose, Bld 101 (H) 70 - 99 mg/dL    Comment: Glucose reference range applies only to samples taken after fasting for at least 8 hours.   BUN 10 8 - 23 mg/dL   Creatinine, Ser 7.34 0.44 - 1.00 mg/dL   Calcium 9.7 8.9 - 19.3 mg/dL   Total Protein 6.7 6.5 - 8.1 g/dL   Albumin 3.5  3.5 - 5.0 g/dL   AST 19 15 - 41 U/L   ALT 15 0 - 44 U/L   Alkaline Phosphatase 64 38 - 126 U/L   Total Bilirubin 0.5 0.3 - 1.2 mg/dL   GFR, Estimated >60 >45 mL/min    Comment: (NOTE) Calculated using the CKD-EPI Creatinine Equation (2021)    Anion gap 7 5 - 15    Comment: Performed at [facility], [address] [place], [place] [address]  Troponin I (High Sensitivity)     Status: None   Collection Time: 01/17/22  2:34 PM  Result Value Ref Range   Troponin I (High Sensitivity) 4 <18 ng/L    Comment: (NOTE) Elevated high sensitivity troponin I (hsTnI) values and significant  changes across serial measurements may suggest ACS but many other  chronic and acute conditions are known to elevate hsTnI results.  Refer to the "Links" section for chest pain algorithms and additional  guidance. Performed at [facility], [address] [place], [place] [address]   Brain natriuretic peptide     Status: Abnormal   Collection Time: 01/17/22  2:34 PM  Result Value Ref Range   B Natriuretic Peptide 115.8 (H) 0.0 - 100.0 pg/mL    Comment: Performed at [facility], [address] [place], [place] [address]  Troponin I (High Sensitivity)     Status: None   Collection Time: 01/17/22  4:45 PM  Result Value Ref Range   Troponin I (High Sensitivity) 4 <18 ng/L    Comment: (NOTE) Elevated high  sensitivity troponin I (hsTnI) values and significant  changes across serial measurements may suggest ACS but many other  chronic and acute conditions are known to elevate hsTnI results.  Refer to the "Links" section for chest pain algorithms and additional  guidance. Performed at [facility], [address] [place], [place] [address]    CT Angio Chest PE W and/or Wo Contrast  Result Date: 01/17/2022 CLINICAL DATA:  Suspected pulmonary embolism. EXAM: CT ANGIOGRAPHY CHEST WITH CONTRAST TECHNIQUE: Multidetector CT imaging of the chest was performed using the standard protocol during bolus administration of intravenous contrast. Multiplanar CT image reconstructions and MIPs were obtained to evaluate the vascular anatomy. RADIATION DOSE REDUCTION: This exam was performed according to the departmental dose-optimization program which includes automated exposure control, adjustment of the mA and/or kV according to patient size and/or use of iterative reconstruction technique. CONTRAST:  77mL OMNIPAQUE IOHEXOL 350 MG/ML SOLN COMPARISON:  None Available. FINDINGS: Cardiovascular: There is mild calcification of the aortic arch, without evidence of aortic aneurysm or dissection. Satisfactory opacification of the pulmonary arteries to the segmental level. No evidence of pulmonary embolism. Normal heart size. A coronary artery stent is in place. No pericardial effusion. Mediastinum/Nodes: No enlarged mediastinal, hilar, or axillary lymph nodes. Thyroid gland, trachea, and esophagus demonstrate no significant findings. Lungs/Pleura: An 8 mm noncalcified pulmonary nodule is seen within the lateral aspect of the left lower lobe (axial CT image 66, CT series 6). Very mild atelectatic changes are seen along the posterior aspect of the bilateral lower lobes. There is no evidence of acute infiltrate, pleural effusion or pneumothorax. Upper Abdomen: No acute abnormality. Musculoskeletal: Extensive postoperative  changes are seen within the visualized portion of the lumbar spine. Multilevel degenerative changes are noted throughout the thoracic spine and lower cervical spine. Review of the MIP images confirms the above findings. IMPRESSION: 1. No evidence of pulmonary embolism  or other acute intrathoracic process. 2. 8 mm noncalcified left lower lobe pulmonary nodule. Non-contrast chest CT at 6-12 months is recommended. If the nodule is stable at time of repeat CT, then future CT at 18-24 months (from today's scan) is considered optional for low-risk patients, but is recommended for high-risk patients. This recommendation follows the consensus statement: Guidelines for Management of Incidental Pulmonary Nodules Detected on CT Images: From the Fleischner Society 2017; Radiology 2017; 284:228-243. Aortic Atherosclerosis (ICD10-I70.0). Electronically Signed   By: Virgina Norfolk M.D.   On: 01/17/2022 19:42   US Venous Img Lower  Left (DVT Study)  Result Date: 01/17/2022 CLINICAL DATA:  Leg swelling. EXAM: LEFT LOWER EXTREMITY VENOUS DOPPLER ULTRASOUND TECHNIQUE: Gray-scale sonography with compression, as well as color and duplex ultrasound, were performed to evaluate the deep venous system(s) from the level of the common femoral vein through the popliteal and proximal calf veins. COMPARISON:  None Available. FINDINGS: VENOUS Normal compressibility of the common femoral, superficial femoral, and popliteal veins, as well as the visualized calf veins. Visualized portions of profunda femoral vein and great saphenous vein unremarkable. No filling defects to suggest DVT on grayscale or color Doppler imaging. Doppler waveforms show normal direction of venous flow, normal respiratory plasticity and response to augmentation. Limited views of the contralateral common femoral vein are unremarkable. OTHER None. Limitations: none IMPRESSION: No evidence of left lower extremity DVT. Electronically Signed   By: Keith Rake M.D.    On: 01/17/2022 17:35   DG Chest 2 View  Result Date: 01/17/2022 CLINICAL DATA:  Shortness of breath. EXAM: CHEST - 2 VIEW COMPARISON:  Chest radiographs 09/14/2016 FINDINGS: The cardiac silhouette is borderline enlarged. There is chronic coarsening of the interstitial markings. No overt pulmonary edema, airspace consolidation, pleural effusion, or pneumothorax is identified. Prior lumbar fusion is noted. IMPRESSION: No active cardiopulmonary disease. Electronically Signed   By: Logan Bores M.D.   On: 01/17/2022 15:00    Pending Labs Unresulted Labs (From admission, onward)    None       Vitals/Pain Today's Vitals   01/18/22 1100 01/18/22 1200 01/18/22 1300 01/18/22 1400  BP: (!) 150/82 (!) 143/78 139/75 (!) 141/76  Pulse: 66 63 61 65  Resp: 18 18 17  (!) 23  Temp:      TempSrc:      SpO2: 95% 97% 93% 97%  Weight:      Height:      PainSc:        Isolation Precautions No active isolations  Medications Medications  iohexol (OMNIPAQUE) 350 MG/ML injection 75 mL (75 mLs Intravenous Contrast Given 01/17/22 1934)  furosemide (LASIX) injection 20 mg (20 mg Intravenous Given 01/17/22 1955)  acetaminophen (TYLENOL) tablet 650 mg (650 mg Oral Given 01/18/22 [id number])  acetaminophen (TYLENOL) tablet 1,000 mg (1,000 mg Oral Given 01/18/22 1127)    Mobility walks Low fall risk   Focused Assessments Cardiac Assessment Handoff:  Cardiac Rhythm: Normal sinus rhythm Lab Results  Component Value Date   TROPONINI 0.14 ([place]) 09/15/2016   Lab Results  Component Value Date   DDIMER 0.49 09/14/2016   Does the Patient currently have chest pain? No    R Recommendations: See Admitting Provider Note  Report given to:   Additional Notes: Ambulates independently

## 2022-01-18 NOTE — ED Notes (Signed)
Dr Pilar Plate at bedside to speak with pt

## 2022-01-18 NOTE — ED Notes (Signed)
Ambulated to BR.

## 2022-01-19 ENCOUNTER — Observation Stay (HOSPITAL_BASED_OUTPATIENT_CLINIC_OR_DEPARTMENT_OTHER): Payer: Medicare Other

## 2022-01-19 DIAGNOSIS — I5031 Acute diastolic (congestive) heart failure: Secondary | ICD-10-CM | POA: Diagnosis not present

## 2022-01-19 DIAGNOSIS — I2510 Atherosclerotic heart disease of native coronary artery without angina pectoris: Secondary | ICD-10-CM | POA: Diagnosis not present

## 2022-01-19 LAB — BASIC METABOLIC PANEL
Anion gap: 7 (ref 5–15)
BUN: 16 mg/dL (ref 8–23)
CO2: 29 mmol/L (ref 22–32)
Calcium: 10.4 mg/dL — ABNORMAL HIGH (ref 8.9–10.3)
Chloride: 106 mmol/L (ref 98–111)
Creatinine, Ser: 0.93 mg/dL (ref 0.44–1.00)
GFR, Estimated: >60 mL/min (ref >60)
Glucose, Bld: 104 mg/dL — ABNORMAL HIGH (ref 70–99)
Potassium: 4.3 mmol/L (ref 3.5–5.1)
Sodium: 142 mmol/L (ref 135–145)

## 2022-01-19 LAB — ECHOCARDIOGRAM COMPLETE
Area-P 1/2: 3.12 cm2
Calc EF: 63.8 %
Height: 65 in
S' Lateral: 2.7 cm
Single Plane A2C EF: 68.4 %
Single Plane A4C EF: 62.5 %
Weight: 2758.4 oz

## 2022-01-19 MED ORDER — SODIUM CHLORIDE 0.9 % IV SOLN: INTRAVENOUS | Status: DC

## 2022-01-19 MED ORDER — SODIUM CHLORIDE 0.9% FLUSH: 3.0000 mL | INTRAVENOUS | Status: DC | PRN

## 2022-01-19 MED ORDER — SODIUM CHLORIDE 0.9% FLUSH
3.0000 mL | Freq: Two times a day (BID) | INTRAVENOUS | Status: DC
  Administered 2022-01-19 – 2022-01-20 (×3): 3 mL via INTRAVENOUS

## 2022-01-19 MED ORDER — SODIUM CHLORIDE 0.9 % IV SOLN
250.0000 mL | INTRAVENOUS | Status: DC | PRN
Start: 2022-01-19 — End: 2022-01-20

## 2022-01-19 MED ORDER — ASPIRIN 81 MG PO CHEW
81.0000 mg | CHEWABLE_TABLET | ORAL | Status: AC
  Administered 2022-01-20: 81 mg via ORAL
  Filled 2022-01-19: qty 1

## 2022-01-19 MED ORDER — PERFLUTREN LIPID MICROSPHERE
1.0000 mL | INTRAVENOUS | Status: AC | PRN
  Administered 2022-01-19: 2 mL via INTRAVENOUS
  Filled 2022-01-19: qty 10

## 2022-01-19 NOTE — Consult Note (Addendum)
Cardiology Consultation:   Patient ID: [name] [name] MRN: [id number]; DOB: 06/29/1946  Admit date: 01/17/2022 Date of Consult: 01/19/2022  PCP:  Bernerd Limbo, MD   [facility] Providers Cardiologist:  Quay Burow, MD        Patient Profile:   [name] [name] is a 76 y.o. female with a hx of CAD, hypertension, hyperlipidemia, and obstructive sleep apnea on CPAP therapy who is being seen 01/19/2022 for the evaluation of DOE at the request of Dr. Verlon Au.  History of Present Illness:   [name] is a 76 year old female with past medical history of CAD, hypertension, hyperlipidemia, and obstructive sleep apnea on CPAP therapy.  She has a history of difficult to control high blood pressure.  She had NSTEMI in March 2018 and underwent LAD intervention by Dr. Azucena Kuba.  Due to recurrent symptoms, she was recast in October 2019 revealing in-stent restenosis of LAD stent, this was restented by Dr. Alvester Chou.  She did have a jailed first diagonal branch which was small and a 50% mid LAD residual beyond the stented segment.  Last PCI was performed in October 2020 at which time patient was having progressive dyspnea on exertion and substernal chest pain, cardiac catheterization revealed patent LAD stent, 40% disease beyond the LAD stent, 90% ostial diagonal lesion which was treated with balloon angioplasty reducing the lesion to less than 30%.  EF by LV gram was 55 to 65%.  Patient was last seen by Dr. Gwenlyn Found in January 2023.  Blood pressure at the time was 162/74.  She has since been seen several times by hypertension clinic, metoprolol tartrate was switched to carvedilol for better blood pressure control.  She was last seen in the hypertension clinic in March, blood pressure at the time was 138/84.  Over the past several weeks, patient has been having increasing dyspnea on exertion.  About a month ago, she had an episode of chest pain requiring sublingual nitroglycerin, however she has not had any  further chest pain since.  Dyspnea on exertion progressed to the point that she was feeling short of breath at rest.  She says she was barely able to walk for long distance in the supermarket as she was out of breath after only a short period.  This prompted the patient to seek more urgent medical attention at [facility] on 01/17/2022.  Serial troponin was negative x2.  BNP was elevated at 115.8.  Renal function and electrolytes were normal.  Red blood cell count was normal.  Chest x-ray shows no acute finding.  EKG showed normal sinus rhythm, no significant ST-T wave changes.  Her blood pressure was elevated at 180/81 in the emergency room.  Venous Doppler was negative.  A CTA of the chest showed a 8 mm noncalcified left lower lobe lung nodule, repeat study recommended in 6 to 12 months, otherwise no evidence of acute PE or other intrathoracic process.  Patient was given a single 20 mg of IV Lasix in the emergency room and subsequently had a significant diuresis.  Weight has dropped from 178 pounds down to 172.4 pounds.  Patient was admitted by hospitalist service for possible heart failure.  Valsartan 320 mg daily has been switched to irbesartan.  Low-dose HCTZ 12.5 mg daily was added to the medical regimen.  Blood pressure is now in the 120s to 130s.  Her breathing has significantly improved after diuresis.  Echocardiogram is currently pending.  Troponin negative 4-->4.  Cardiology service consulted for possible heart failure.  Past Medical History:  Diagnosis Date   Arthritis    "knees, hips, hands, back" (09/15/2016)   Constipation    Coronary artery disease    NSTEMI-PCI March 2018   Exercise-induced asthma    Family history of adverse reaction to anesthesia    "daughter gets PONV too"   GERD (gastroesophageal reflux disease)    Heart murmur dx'd 09/14/2016   High cholesterol    Hypertension    Migraine    "recently had series of injections; none since; they had been weekly 1 month ago"  (09/15/2016)   Necrotizing fasciitis (HCC)    R index finger- had a amp   NSTEMI (non-ST elevated myocardial infarction) ([place])    Pneumonia ~ 2003   PONV (postoperative nausea and vomiting)    Patch helps   RSD (reflex sympathetic dystrophy)    Right arm after surgery    Past Surgical History:  Procedure Laterality Date   ANTERIOR LATERAL LUMBAR FUSION 4 LEVELS Right 09/20/2012   Procedure: ANTERIOR LATERAL LUMBAR FUSION 4 LEVELS;  Surgeon: Erline Levine, MD;  Location: [place] [facility];  Service: Neurosurgery;  Laterality: Right;  Right L1-2 L2-3 L3-4 L4-5 Anterolateral decompression/fusion/with L5-S1 Posterior lumbar interbody fusion/Percutaneous pedicle screws L1-S1   BACK SURGERY     CATARACT EXTRACTION W/ INTRAOCULAR LENS  IMPLANT, BILATERAL Bilateral 2017   CORONARY ANGIOPLASTY  05/14/2018   CORONARY ANGIOPLASTY WITH STENT PLACEMENT     CORONARY BALLOON ANGIOPLASTY N/A 05/14/2018   Procedure: CORONARY BALLOON ANGIOPLASTY;  Surgeon: Lorretta Harp, MD;  Location: [place] [facility];  Service: Cardiovascular;  Laterality: N/A;   CORONARY BALLOON ANGIOPLASTY N/A 04/25/2019   Procedure: CORONARY BALLOON ANGIOPLASTY;  Surgeon: Lorretta Harp, MD;  Location: [place] [facility];  Service: Cardiovascular;  Laterality: N/A;  diagonal 1st   CORONARY STENT INTERVENTION N/A 09/15/2016   Procedure: Coronary Stent Intervention;  Surgeon: Charolette Forward, MD;  Location: [place] [facility];  Service: Cardiovascular;  Laterality: N/A;   CORONARY STENT INTERVENTION N/A 05/14/2018   Procedure: CORONARY STENT INTERVENTION;  Surgeon: Lorretta Harp, MD;  Location: [place] [facility];  Service: Cardiovascular;  Laterality: N/A;   FINGER AMPUTATION Right 2008   Index finger   INTRAVASCULAR PRESSURE WIRE/FFR STUDY N/A 09/15/2016   Procedure: Intravascular Pressure Wire/FFR Study;  Surgeon: Charolette Forward, MD;  Location: [place] [facility];  Service: Cardiovascular;  Laterality: N/A;   LEFT HEART CATH AND  CORONARY ANGIOGRAPHY N/A 09/15/2016   Procedure: Left Heart Cath and Coronary Angiography;  Surgeon: Charolette Forward, MD;  Location: [place] [facility];  Service: Cardiovascular;  Laterality: N/A;   LEFT HEART CATH AND CORONARY ANGIOGRAPHY N/A 05/14/2018   Procedure: LEFT HEART CATH AND CORONARY ANGIOGRAPHY;  Surgeon: Lorretta Harp, MD;  Location: [place] [facility];  Service: Cardiovascular;  Laterality: N/A;   LEFT HEART CATH AND CORONARY ANGIOGRAPHY N/A 04/25/2019   Procedure: LEFT HEART CATH AND CORONARY ANGIOGRAPHY;  Surgeon: Lorretta Harp, MD;  Location: [place] [facility];  Service: Cardiovascular;  Laterality: N/A;   TONSILLECTOMY       Home Medications:  Prior to Admission medications   Medication Sig Start Date End Date Taking? Authorizing Provider  acetaminophen (TYLENOL) 500 MG tablet Take 500-650 mg by mouth every 6 (six) hours as needed for headache or moderate pain.   Yes [provider]  AIMOVIG 70 MG/ML SOAJ Inject 70 mg into the skin every 30 (thirty) days. 10/15/18  Yes [provider]  albuterol (PROVENTIL HFA;VENTOLIN HFA) 108 (90 Base) MCG/ACT inhaler Inhale 2 puffs into the lungs 4 (four) times daily as needed for wheezing or shortness of breath. 07/26/13  Yes [provider]  aspirin EC 81 MG tablet Take 81 mg by mouth daily.   Yes [provider]  carvedilol (COREG) 25 MG tablet Take 25 mg by mouth 2 (two) times daily with a meal.   Yes [provider]  clopidogrel (PLAVIX) 75 MG tablet TAKE 1 TABLET BY MOUTH DAILY. Patient taking differently: Take 75 mg by mouth daily. 08/13/21  Yes Runell Gess, MD  fluticasone (FLONASE) 50 MCG/ACT nasal spray Place 1-2 sprays into both nostrils daily as needed for allergies.   Yes [provider]  levocetirizine (XYZAL) 5 MG tablet Take 5 mg by mouth every evening.   Yes [provider]  nitroGLYCERIN (NITROSTAT) 0.4 MG SL tablet Place 1 tablet (0.4 mg total) under the  tongue every 5 (five) minutes x 3 doses as needed. Patient taking differently: Place 0.4 mg under the tongue every 5 (five) minutes x 3 doses as needed for chest pain. 02/22/18  Yes Runell Gess, MD  nortriptyline (PAMELOR) 25 MG capsule Take 25 mg by mouth 2 (two) times daily.   Yes [provider]  pantoprazole (PROTONIX) 40 MG tablet Take 1 tablet (40 mg total) by mouth daily. 05/15/18  Yes Arty Baumgartner, NP  Propylene Glycol (SYSTANE COMPLETE OP) Place 1 drop into both eyes daily as needed (dry eyes).   Yes [provider]  REPATHA SURECLICK 140 MG/ML SOAJ INJECT 140 MG INTO THE SKIN EVERY 14 DAYS. Patient taking differently: Inject 140 mg into the skin every 14 (fourteen) days. 10/21/21  Yes Runell Gess, MD  sertraline (ZOLOFT) 100 MG tablet Take 100 mg by mouth daily. 10/19/21  Yes [provider]  valsartan (DIOVAN) 320 MG tablet Take 1 tablet (320 mg total) by mouth daily. 08/18/21  Yes Runell Gess, MD  Vitamin D, Ergocalciferol, (DRISDOL) 1.25 MG (50000 UNIT) CAPS capsule Take 50,000 Units by mouth once a week. 07/07/21  Yes [provider]  amLODipine (NORVASC) 5 MG tablet Take 1 tablet (5 mg total) by mouth daily. Patient not taking: Reported on 01/18/2022 08/26/21   Runell Gess, MD  carvedilol (COREG) 6.25 MG tablet Take 1 tablet (6.25 mg total) by mouth 2 (two) times daily. Patient not taking: Reported on 01/18/2022 09/16/21   Runell Gess, MD    Inpatient Medications: Scheduled Meds:  aspirin EC  81 mg Oral Daily   carvedilol  25 mg Oral BID WC   clopidogrel  75 mg Oral Daily   enoxaparin (LOVENOX) injection  40 mg Subcutaneous QHS   hydrochlorothiazide  12.5 mg Oral Daily   irbesartan  300 mg Oral QHS   loratadine  10 mg Oral Daily   nortriptyline  25 mg Oral BID   pantoprazole  40 mg Oral Daily   sertraline  100 mg Oral Daily   sodium chloride flush  3 mL Intravenous Q12H   Continuous Infusions:  sodium chloride      PRN Meds: sodium chloride, acetaminophen, fluticasone, ondansetron (ZOFRAN) IV, sodium chloride flush  Allergies:    Allergies  Allergen Reactions   Codeine Anaphylaxis   Garlic Anaphylaxis   Morphine And Related Nausea And Vomiting   Atorvastatin Other (See Comments)    Muscle aches   Pravastatin Sodium Other (See Comments)    Muscle pain  Social History:   Social History   Socioeconomic History   Marital status: Married    Spouse name: Not on file   Number of children: Not on file   Years of education: Not on file   Highest education level: Not on file  Occupational History   Not on file  Tobacco Use   Smoking status: Never   Smokeless tobacco: Never  Vaping Use   Vaping Use: Never used  Substance and Sexual Activity   Alcohol use: No   Drug use: No   Sexual activity: Not on file  Other Topics Concern   Not on file  Social History Narrative   Not on file   Social Determinants of Health   Financial Resource Strain: Not on file  Food Insecurity: Not on file  Transportation Needs: Not on file  Physical Activity: Not on file  Stress: Not on file  Social Connections: Not on file  Intimate Partner Violence: Not on file    Family History:    Family History  Problem Relation Age of Onset   Heart failure Mother    Cancer Mother 76       Breast   Kidney failure Father    Brain cancer Sister      ROS:  Please see the history of present illness.   All other ROS reviewed and negative.     Physical Exam/Data:   Vitals:   01/19/22 0006 01/19/22 0500 01/19/22 0558 01/19/22 0805  BP: 129/64  136/66 131/70  Pulse: 61  (!) 54   Resp: 16  17 17   Temp: 98 F (36.7 C)  99 F (37.2 C)   TempSrc: Oral  Oral   SpO2: 96%  97%   Weight:  78.2 kg    Height:        Intake/Output Summary (Last 24 hours) at 01/19/2022 1007 Last data filed at 01/19/2022 0938 Gross per 24 hour  Intake 413 ml  Output --  Net 413 ml      01/19/2022    5:00 AM 01/18/2022     3:25 PM 01/17/2022    2:31 PM  Last 3 Weights  Weight (lbs) 172 lb 6.4 oz 173 lb 1 oz 178 lb  Weight (kg) 78.2 kg 78.5 kg 80.74 kg     Body mass index is 28.69 kg/m.  General:  Well nourished, well developed, in no acute distress HEENT: normal Neck: no JVD Vascular: No carotid bruits; Distal pulses 2+ bilaterally Cardiac:  normal S1, S2; RRR; no murmur  Lungs:  clear to auscultation bilaterally, no wheezing, rhonchi or rales  Abd: soft, nontender, no hepatomegaly  Ext: no edema Musculoskeletal:  No deformities, BUE and BLE strength normal and equal Skin: warm and dry  Neuro:  CNs 2-12 intact, no focal abnormalities noted Psych:  Normal affect   EKG:  The EKG was personally reviewed and demonstrates: Normal sinus rhythm, no significant ST-T wave changes Telemetry:  Telemetry was personally reviewed and demonstrates: Normal sinus rhythm, no significant irregular rhythm  Relevant CV Studies:  Cath 04/25/2019 Previously placed Prox LAD to Mid LAD stent (unknown type) is widely patent. Mid LAD lesion is 40% stenosed. 1st Diag lesion is 99% stenosed. Post intervention, there is a 30% residual stenosis. The left ventricular systolic function is normal. LV end diastolic pressure is normal. The left ventricular ejection fraction is 55-65% by visual estimate.   Laboratory Data:  High Sensitivity Troponin:   Recent Labs  Lab 01/17/22 1434 01/17/22 1645  TROPONINIHS 4 4     Chemistry Recent Labs  Lab 01/17/22 1434 01/19/22 0353  NA 139 142  K 4.1 4.3  CL 107 106  CO2 25 29  GLUCOSE 101* 104*  BUN 10 16  CREATININE 0.71 0.93  CALCIUM 9.7 10.4*  GFRNONAA >60 >60  ANIONGAP 7 7    Recent Labs  Lab 01/17/22 1434  PROT 6.7  ALBUMIN 3.5  AST 19  ALT 15  ALKPHOS 64  BILITOT 0.5   Lipids No results for input(s): "CHOL", "TRIG", "HDL", "LABVLDL", "LDLCALC", "CHOLHDL" in the last 168 hours.  Hematology Recent Labs  Lab 01/17/22 1434  WBC 5.3  RBC 4.49  HGB 12.9   HCT 38.5  MCV 85.7  MCH 28.7  MCHC 33.5  RDW 13.6  PLT 315   Thyroid No results for input(s): "TSH", "FREET4" in the last 168 hours.  BNP Recent Labs  Lab 01/17/22 1434  BNP 115.8*    DDimer No results for input(s): "DDIMER" in the last 168 hours.   Radiology/Studies:  CT Angio Chest PE W and/or Wo Contrast  Result Date: 01/17/2022 CLINICAL DATA:  Suspected pulmonary embolism. EXAM: CT ANGIOGRAPHY CHEST WITH CONTRAST TECHNIQUE: Multidetector CT imaging of the chest was performed using the standard protocol during bolus administration of intravenous contrast. Multiplanar CT image reconstructions and MIPs were obtained to evaluate the vascular anatomy. RADIATION DOSE REDUCTION: This exam was performed according to the departmental dose-optimization program which includes automated exposure control, adjustment of the mA and/or kV according to patient size and/or use of iterative reconstruction technique. CONTRAST:  13mL OMNIPAQUE IOHEXOL 350 MG/ML SOLN COMPARISON:  None Available. FINDINGS: Cardiovascular: There is mild calcification of the aortic arch, without evidence of aortic aneurysm or dissection. Satisfactory opacification of the pulmonary arteries to the segmental level. No evidence of pulmonary embolism. Normal heart size. A coronary artery stent is in place. No pericardial effusion. Mediastinum/Nodes: No enlarged mediastinal, hilar, or axillary lymph nodes. Thyroid gland, trachea, and esophagus demonstrate no significant findings. Lungs/Pleura: An 8 mm noncalcified pulmonary nodule is seen within the lateral aspect of the left lower lobe (axial CT image 66, CT series 6). Very mild atelectatic changes are seen along the posterior aspect of the bilateral lower lobes. There is no evidence of acute infiltrate, pleural effusion or pneumothorax. Upper Abdomen: No acute abnormality. Musculoskeletal: Extensive postoperative changes are seen within the visualized portion of the lumbar spine.  Multilevel degenerative changes are noted throughout the thoracic spine and lower cervical spine. Review of the MIP images confirms the above findings. IMPRESSION: 1. No evidence of pulmonary embolism or other acute intrathoracic process. 2. 8 mm noncalcified left lower lobe pulmonary nodule. Non-contrast chest CT at 6-12 months is recommended. If the nodule is stable at time of repeat CT, then future CT at 18-24 months (from today's scan) is considered optional for low-risk patients, but is recommended for high-risk patients. This recommendation follows the consensus statement: Guidelines for Management of Incidental Pulmonary Nodules Detected on CT Images: From the Fleischner Society 2017; Radiology 2017; 284:228-243. Aortic Atherosclerosis (ICD10-I70.0). Electronically Signed   By: Virgina Norfolk M.D.   On: 01/17/2022 19:42   US Venous Img Lower  Left (DVT Study)  Result Date: 01/17/2022 CLINICAL DATA:  Leg swelling. EXAM: LEFT LOWER EXTREMITY VENOUS DOPPLER ULTRASOUND TECHNIQUE: Gray-scale sonography with compression, as well as color and duplex ultrasound, were performed to evaluate the deep venous system(s) from the level of the common femoral vein through the popliteal and proximal calf  veins. COMPARISON:  None Available. FINDINGS: VENOUS Normal compressibility of the common femoral, superficial femoral, and popliteal veins, as well as the visualized calf veins. Visualized portions of profunda femoral vein and great saphenous vein unremarkable. No filling defects to suggest DVT on grayscale or color Doppler imaging. Doppler waveforms show normal direction of venous flow, normal respiratory plasticity and response to augmentation. Limited views of the contralateral common femoral vein are unremarkable. OTHER None. Limitations: none IMPRESSION: No evidence of left lower extremity DVT. Electronically Signed   By: Keith Rake M.D.   On: 01/17/2022 17:35   DG Chest 2 View  Result Date:  01/17/2022 CLINICAL DATA:  Shortness of breath. EXAM: CHEST - 2 VIEW COMPARISON:  Chest radiographs 09/14/2016 FINDINGS: The cardiac silhouette is borderline enlarged. There is chronic coarsening of the interstitial markings. No overt pulmonary edema, airspace consolidation, pleural effusion, or pneumothorax is identified. Prior lumbar fusion is noted. IMPRESSION: No active cardiopulmonary disease. Electronically Signed   By: Logan Bores M.D.   On: 01/17/2022 15:00     Assessment and Plan:   Worsening dyspnea: Felt to be volume overloaded on arrival, given 20 mg IV Lasix and then lost 6 pounds.  Breathing has significantly improved.  Blood pressure was elevated on arrival in the 180s range.  Home losartan switched to irbesartan.  Low-dose HCTZ was added.  -MD to review echocardiogram that was obtained this morning.  Final report is not back yet.  Patient does not appears to be volume overloaded anymore based on physical exam.  -Low threshold to pursue ischemic work-up.  Breathing improved after IV diuresis, consider ambulate the patient to see if she is still short of breath with exertion, if so, she likely will need additional ischemic work-up.  Uncontrolled hypertension: Blood pressure 180s on arrival.  Switched valsartan to irbesartan and added low-dose hydrochlorothiazide.  On carvedilol at home.  Blood pressure is now well controlled.  Chest pain: Patient described one episode of chest pain a month ago, has not had any further chest discomfort.  Will discuss with MD regarding ischemic work-up given worsening dyspnea on exertion in the past several weeks.   CAD: Patient under went stenting of LAD in 2018, followed by restenosis and another stenting of LAD in 2019.  Found to have significant diagonal lesion in 2020 and underwent balloon angioplasty.  Previous symptom was more shortness of breath on exertion and less chest discomfort.  Hyperlipidemia  Obstructive sleep apnea: On CPAP   Risk  Assessment/Risk Scores:     HEAR Score (for undifferentiated chest pain):     New York Heart Association (NYHA) Functional Class NYHA Class IV        For questions or updates, please contact [facility] Please consult [email or web address] for contact info under    Signed, Almyra Deforest, [place]  01/19/2022 10:07 AM  I have seen and examined the patient along with Almyra Deforest, PA .  I have reviewed the chart, notes and new data.  I agree with PA/NP's note.  Key new complaints: She had significant edema and dyspnea at rest.  After diuresis the edema has resolved and she does not have dyspnea at rest orthopnea, but did become immediately dyspneic when she just walked a few steps to the bathroom.  She does not have chest pain Key examination changes: Normal cardiovascular exam.  No residual edema and no JVD.  Clear lungs.  RRR, no audible murmurs. Key new findings / data: Reviewed preliminary images from echocardiogram.  She has  normal left ventricular systolic function and regional wall motion.  The left atrium is mildly dilated.  Although there is impaired relaxation pattern of the mitral inflow, there is no evidence of elevated left heart filling pressures.  PLAN: She had evidence of hypervolemia by physical exam but her BNP was borderline elevated at about 110.  Echo shows normal LV systolic function and wall motion and no evidence of elevated filling pressures at rest..  She does not have angina, but previously presented with symptoms of exertional dyspnea due to coronary insufficiency.  There is no evidence of acute coronary syndrome.  I offered initial conservative evaluation with a nuclear perfusion study, versus proceeding directly to cardiac catheterization.  She finds the second option more definitive and I agree that this would also offer opportunity to treat coronary stenosis at the same time, if this is the cause of her symptoms.  If there is no coronary stenosis, will treat as typical diastolic  heart failure.  Had a long discussion with the patient and her family regarding sodium dietary restriction, daily weight monitoring, signs and symptoms of heart failure exacerbation.  Although she does not add salt to her food, she does eat a lot of ingredients that have very high sodium content.  May need at least intermittent doses of loop diuretic, based on weight monitoring.  Consider SGLT2 inhibitors.  This procedure has been fully reviewed with the patient and written informed consent has been obtained.  We will try to schedule with Dr. Allyson Sabal tomorrow at [facility].   Thurmon Fair, MD, [facility] [phone] 01/19/2022, 12:50 PM

## 2022-01-19 NOTE — Progress Notes (Signed)
PROGRESS NOTE   [name] [name]  CHY:[id number] DOB: 1946/01/08 DOA: 01/17/2022 PCP: Tracey Harries, MD  Brief Narrative:  76 year old female community dwelling CAD s/p stent proximal to mid LAD 04/26/2019, prior PCI/DES 09/2016 HTN HLD Reflux  Presented to Emergency Room 01/18/2022 in the setting titration of meds x2 in the outpatient setting by Dr. Lesly Rubenstein pressures >200 systolic and exercise tolerance decreased in addition to leg swelling gaining over 5 pounds over 3 months  ED work-up CT negative for PE DVT study negative  Admitted for possible underlying patient exacerbation of acute heart failure  Hospital-Problem based course  Acute superimposed on chronic HFpEF EF this admission 60-65% ?  Anginal equivalent Patient with shortness of breath X 2 weeks with weight gain in the setting of recent titration of blood pressure meds in the outpatient She feels better but given her presentation in the past with acute coronary syndrome seemed similar to heart failure she is going for heart cath tomorrow Continue Coreg 25 twice daily, ibuprofen 300, HCTZ 12.5 daily Diuresis held today Continue Plavix 75 aspirin 81 Recommendation cautious fluid restriction and will start with 2000 cc Migraines Takes Pamelor 25 twice daily for this and Aimovig which will be resumed on discharge Try to titrate off of Pamelor may be able to use beta-blocker Topamax or valproic acid in the outpatient setting OSA on CPAP Continue CPAP nightly-needs outpatient titration of device  DVT prophylaxis: Lovenox Code Status: Full Family Communication: Daughter at the bedside Disposition:  Status is: Observation The patient will require care spanning > 2 midnights and should be moved to inpatient because:   Needs cardiac cath and monitoring of fluid status   Consultants:  Cardiology  Procedures: Echocardiogram EF 7/5---60-65%  Antimicrobials: None   Subjective: Feels about 70% her normal when moving  around usually is very active No chest pain No rales rhonchi No wheeze  Objective: Vitals:   01/19/22 0006 01/19/22 0500 01/19/22 0558 01/19/22 0805  BP: 129/64  136/66 131/70  Pulse: 61  (!) 54   Resp: 16  17 17   Temp: 98 F (36.7 C)  99 F (37.2 C)   TempSrc: Oral  Oral   SpO2: 96%  97%   Weight:  78.2 kg    Height:        Intake/Output Summary (Last 24 hours) at 01/19/2022 0815 Last data filed at 01/19/2022 0600 Gross per 24 hour  Intake 293 ml  Output --  Net 293 ml   Filed Weights   01/17/22 1431 01/18/22 1525 01/19/22 0500  Weight: 80.7 kg 78.5 kg 78.2 kg    Examination:  Awake coherent no distress EOMI NCAT younger than stated age Neck soft supple, mild JVD on hepatojugular reflex No lower extremity edema No wheeze ROM intact moving 4 limbs equally no focal deficit Psych euthymic  Data Reviewed: personally reviewed   CBC    Component Value Date/Time   WBC 5.3 01/17/2022 1434   RBC 4.49 01/17/2022 1434   HGB 12.9 01/17/2022 1434   HGB 14.2 04/24/2019 1138   HCT 38.5 01/17/2022 1434   HCT 42.8 04/24/2019 1138   PLT 315 01/17/2022 1434   PLT 298 04/24/2019 1138   MCV 85.7 01/17/2022 1434   MCV 87 04/24/2019 1138   MCH 28.7 01/17/2022 1434   MCHC 33.5 01/17/2022 1434   RDW 13.6 01/17/2022 1434   RDW 13.4 04/24/2019 1138   LYMPHSABS 1.4 01/17/2022 1434   MONOABS 0.4 01/17/2022 1434   EOSABS 0.3 01/17/2022 1434  BASOSABS 0.0 01/17/2022 1434      Latest Ref Rng & Units 01/19/2022    3:53 AM 01/17/2022    2:34 PM 08/13/2021    3:50 PM  CMP  Glucose 70 - 99 mg/dL 104  101  89   BUN 8 - 23 mg/dL 16  10  9    Creatinine 0.44 - 1.00 mg/dL 0.93  0.71  0.72   Sodium 135 - 145 mmol/L 142  139  146   Potassium 3.5 - 5.1 mmol/L 4.3  4.1  4.4   Chloride 98 - 111 mmol/L 106  107  107   CO2 22 - 32 mmol/L 29  25  25    Calcium 8.9 - 10.3 mg/dL 10.4  9.7  10.2   Total Protein 6.5 - 8.1 g/dL  6.7    Total Bilirubin 0.3 - 1.2 mg/dL  0.5    Alkaline Phos 38 - 126  U/L  64    AST 15 - 41 U/L  19    ALT 0 - 44 U/L  15       Radiology Studies: CT Angio Chest PE W and/or Wo Contrast  Result Date: 01/17/2022 CLINICAL DATA:  Suspected pulmonary embolism. EXAM: CT ANGIOGRAPHY CHEST WITH CONTRAST TECHNIQUE: Multidetector CT imaging of the chest was performed using the standard protocol during bolus administration of intravenous contrast. Multiplanar CT image reconstructions and MIPs were obtained to evaluate the vascular anatomy. RADIATION DOSE REDUCTION: This exam was performed according to the departmental dose-optimization program which includes automated exposure control, adjustment of the mA and/or kV according to patient size and/or use of iterative reconstruction technique. CONTRAST:  36mL OMNIPAQUE IOHEXOL 350 MG/ML SOLN COMPARISON:  None Available. FINDINGS: Cardiovascular: There is mild calcification of the aortic arch, without evidence of aortic aneurysm or dissection. Satisfactory opacification of the pulmonary arteries to the segmental level. No evidence of pulmonary embolism. Normal heart size. A coronary artery stent is in place. No pericardial effusion. Mediastinum/Nodes: No enlarged mediastinal, hilar, or axillary lymph nodes. Thyroid gland, trachea, and esophagus demonstrate no significant findings. Lungs/Pleura: An 8 mm noncalcified pulmonary nodule is seen within the lateral aspect of the left lower lobe (axial CT image 66, CT series 6). Very mild atelectatic changes are seen along the posterior aspect of the bilateral lower lobes. There is no evidence of acute infiltrate, pleural effusion or pneumothorax. Upper Abdomen: No acute abnormality. Musculoskeletal: Extensive postoperative changes are seen within the visualized portion of the lumbar spine. Multilevel degenerative changes are noted throughout the thoracic spine and lower cervical spine. Review of the MIP images confirms the above findings. IMPRESSION: 1. No evidence of pulmonary embolism or other  acute intrathoracic process. 2. 8 mm noncalcified left lower lobe pulmonary nodule. Non-contrast chest CT at 6-12 months is recommended. If the nodule is stable at time of repeat CT, then future CT at 18-24 months (from today's scan) is considered optional for low-risk patients, but is recommended for high-risk patients. This recommendation follows the consensus statement: Guidelines for Management of Incidental Pulmonary Nodules Detected on CT Images: From the Fleischner Society 2017; Radiology 2017; 284:228-243. Aortic Atherosclerosis (ICD10-I70.0). Electronically Signed   By: Virgina Norfolk M.D.   On: 01/17/2022 19:42   US Venous Img Lower  Left (DVT Study)  Result Date: 01/17/2022 CLINICAL DATA:  Leg swelling. EXAM: LEFT LOWER EXTREMITY VENOUS DOPPLER ULTRASOUND TECHNIQUE: Gray-scale sonography with compression, as well as color and duplex ultrasound, were performed to evaluate the deep venous system(s) from the level of  the common femoral vein through the popliteal and proximal calf veins. COMPARISON:  None Available. FINDINGS: VENOUS Normal compressibility of the common femoral, superficial femoral, and popliteal veins, as well as the visualized calf veins. Visualized portions of profunda femoral vein and great saphenous vein unremarkable. No filling defects to suggest DVT on grayscale or color Doppler imaging. Doppler waveforms show normal direction of venous flow, normal respiratory plasticity and response to augmentation. Limited views of the contralateral common femoral vein are unremarkable. OTHER None. Limitations: none IMPRESSION: No evidence of left lower extremity DVT. Electronically Signed   By: Narda Rutherford M.D.   On: 01/17/2022 17:35   DG Chest 2 View  Result Date: 01/17/2022 CLINICAL DATA:  Shortness of breath. EXAM: CHEST - 2 VIEW COMPARISON:  Chest radiographs 09/14/2016 FINDINGS: The cardiac silhouette is borderline enlarged. There is chronic coarsening of the interstitial  markings. No overt pulmonary edema, airspace consolidation, pleural effusion, or pneumothorax is identified. Prior lumbar fusion is noted. IMPRESSION: No active cardiopulmonary disease. Electronically Signed   By: Sebastian Ache M.D.   On: 01/17/2022 15:00     Scheduled Meds:  aspirin EC  81 mg Oral Daily   carvedilol  25 mg Oral BID WC   clopidogrel  75 mg Oral Daily   enoxaparin (LOVENOX) injection  40 mg Subcutaneous QHS   hydrochlorothiazide  12.5 mg Oral Daily   irbesartan  300 mg Oral QHS   loratadine  10 mg Oral Daily   nortriptyline  25 mg Oral BID   pantoprazole  40 mg Oral Daily   sertraline  100 mg Oral Daily   sodium chloride flush  3 mL Intravenous Q12H   Continuous Infusions:  sodium chloride       LOS: 0 days   Time spent: 42  Rhetta Mura, MD [facility] To contact the attending provider between 7A-7P or the covering provider during after hours 7P-7A, please log into the web site [email or web address] and access using universal [place] password for that web site. If you do not have the password, please call the hospital operator.  01/19/2022, 8:15 AM

## 2022-01-19 NOTE — TOC Initial Note (Signed)
Transition of Care [facility]) - Initial/Assessment Note    Patient Details  Name: [name] [name] MRN: [id number] Date of Birth: 03-Sep-1945  [facility] CM/SW Contact:    Golda Acre, RN Phone Number: 01/19/2022, 7:44 AM  Clinical Narrative:                 [phone] chart reviewed.  Following for toc needs. Home heart failure screening sent to [facility] for review. Plan is to return home with self-care at this time.  Expected Discharge Plan: Home w Home Health Services Barriers to Discharge: Continued Medical Work up   Patient Goals and CMS Choice Patient states their goals for this hospitalization and ongoing recovery are:: to go home CMS Medicare.gov Compare Post Acute Care list provided to:: Patient Choice offered to / list presented to : Patient  Expected Discharge Plan and Services Expected Discharge Plan: Home w Home Health Services   Discharge Planning Services: CM Consult Post Acute Care Choice: Home Health Living arrangements for the past 2 months: Single Family Home                           HH Arranged: Disease Management HH Agency: [facility] Date [facility] Agency Contacted: 01/19/22 Time HH Agency Contacted: [phone] Representative spoke with at [facility] Agency: aaron peele  Prior Living Arrangements/Services Living arrangements for the past 2 months: Single Family Home Lives with:: Spouse Patient language and need for interpreter reviewed:: Yes Do you feel safe going back to the place where you live?: Yes            Criminal Activity/Legal Involvement Pertinent to Current Situation/Hospitalization: No - Comment as needed  Activities of Daily Living Home Assistive Devices/Equipment: None ADL Screening (condition at time of admission) Patient's cognitive ability adequate to safely complete daily activities?: Yes Is the patient deaf or have difficulty hearing?: No Does the patient have difficulty seeing, even when wearing glasses/contacts?:  No Does the patient have difficulty concentrating, remembering, or making decisions?: No Patient able to express need for assistance with ADLs?: Yes Does the patient have difficulty dressing or bathing?: No Independently performs ADLs?: Yes (appropriate for developmental age) Does the patient have difficulty walking or climbing stairs?: No Weakness of Legs: None Weakness of Arms/Hands: None  Permission Sought/Granted                  Emotional Assessment Appearance:: Appears stated age     Orientation: : Oriented to Self, Oriented to Place, Oriented to  Time, Oriented to Situation Alcohol / Substance Use: Not Applicable Psych Involvement: No (comment)  Admission diagnosis:  SOB (shortness of breath) [R06.02] Acute CHF (HCC) [I50.9] CHF (congestive heart failure) (HCC) [I50.9] Patient Active Problem List   Diagnosis Date Noted   CHF (congestive heart failure) (HCC) 01/18/2022   Acute CHF (HCC) 01/17/2022   S/P PTCA (percutaneous transluminal coronary angioplasty)    CAD in native artery 04/25/2019   CAD S/P PCI 05/10/2018   DOE (dyspnea on exertion) 05/10/2018   Chest pain with high risk of acute coronary syndrome 05/10/2018   History of NSTEMI 09/14/2016   Medicare annual wellness visit, subsequent 04/04/2016   Acute pain of right knee 03/08/2016   Annual physical exam 03/30/2015   Extrinsic asthma 07/26/2013   Pain in joint, pelvic region and thigh 07/26/2013   Spinal stenosis of lumbar region 06/03/2013   Dyslipidemia, goal LDL below 70 06/03/2013   Backache 12/02/2012  Lumbar scoliosis 09/21/2012   Allergic rhinitis 02/11/2011   TENSION HEADACHE 07/24/2009   Essential hypertension 07/24/2009   GERD 07/24/2009   Arthritis 03/19/2008   PCP:  Tracey Harries, MD Pharmacy:   [facility] - [place], [place] - [address] [place] [place] [address] Phone: [phone] Fax: [phone]     Social Determinants of Health (SDOH)  Interventions    Readmission Risk Interventions     No data to display

## 2022-01-19 NOTE — Plan of Care (Signed)

## 2022-01-19 NOTE — Progress Notes (Signed)
  Echocardiogram 2D Echocardiogram has been performed.  Leta Jungling M 01/19/2022, 11:13 AM

## 2022-01-20 ENCOUNTER — Telehealth: Payer: Self-pay | Admitting: Nurse Practitioner

## 2022-01-20 ENCOUNTER — Encounter (HOSPITAL_COMMUNITY)
Admission: EM | Disposition: A | Discharge status: Home / Self Care | Payer: Self-pay | Source: Home / Self Care | Attending: Emergency Medicine

## 2022-01-20 DIAGNOSIS — I509 Heart failure, unspecified: Secondary | ICD-10-CM | POA: Diagnosis not present

## 2022-01-20 DIAGNOSIS — I252 Old myocardial infarction: Secondary | ICD-10-CM | POA: Diagnosis not present

## 2022-01-20 DIAGNOSIS — I5031 Acute diastolic (congestive) heart failure: Secondary | ICD-10-CM | POA: Diagnosis not present

## 2022-01-20 DIAGNOSIS — I2510 Atherosclerotic heart disease of native coronary artery without angina pectoris: Secondary | ICD-10-CM | POA: Diagnosis not present

## 2022-01-20 DIAGNOSIS — Z20822 Contact with and (suspected) exposure to covid-19: Secondary | ICD-10-CM | POA: Diagnosis not present

## 2022-01-20 HISTORY — PX: CORONARY BALLOON ANGIOPLASTY: CATH118233

## 2022-01-20 HISTORY — PX: RIGHT/LEFT HEART CATH AND CORONARY ANGIOGRAPHY: CATH118266

## 2022-01-20 HISTORY — PX: LEFT HEART CATH AND CORONARY ANGIOGRAPHY: CATH118249

## 2022-01-20 LAB — BASIC METABOLIC PANEL
Anion gap: 7 (ref 5–15)
BUN: 21 mg/dL (ref 8–23)
CO2: 26 mmol/L (ref 22–32)
Calcium: 9.9 mg/dL (ref 8.9–10.3)
Chloride: 105 mmol/L (ref 98–111)
Creatinine, Ser: 0.88 mg/dL (ref 0.44–1.00)
GFR, Estimated: >60 mL/min (ref >60)
Glucose, Bld: 105 mg/dL — ABNORMAL HIGH (ref 70–99)
Potassium: 3.9 mmol/L (ref 3.5–5.1)
Sodium: 138 mmol/L (ref 135–145)

## 2022-01-20 LAB — SARS CORONAVIRUS 2 (TAT 6-24 HRS): SARS Coronavirus 2: NEGATIVE

## 2022-01-20 LAB — POCT ACTIVATED CLOTTING TIME
Activated Clotting Time: 305 seconds
Activated Clotting Time: 281 seconds
Activated Clotting Time: 191 seconds
Activated Clotting Time: 173 seconds

## 2022-01-20 SURGERY — RIGHT/LEFT HEART CATH AND CORONARY ANGIOGRAPHY
Anesthesia: LOCAL

## 2022-01-20 MED ORDER — LABETALOL HCL 5 MG/ML IV SOLN: 10.0000 mg | INTRAVENOUS | Status: AC | PRN

## 2022-01-20 MED ORDER — HEPARIN SODIUM (PORCINE) 1000 UNIT/ML IJ SOLN
INTRAMUSCULAR | Status: DC | PRN
  Administered 2022-01-20: 8000 [IU] via INTRAVENOUS

## 2022-01-20 MED ORDER — LIDOCAINE HCL (PF) 1 % IJ SOLN
INTRAMUSCULAR | Status: AC
  Filled 2022-01-20: qty 30

## 2022-01-20 MED ORDER — LIVING BETTER WITH HEART FAILURE BOOK
Freq: Once | Status: AC
Start: 2022-01-20 — End: 2022-01-20

## 2022-01-20 MED ORDER — IOHEXOL 350 MG/ML SOLN
INTRAVENOUS | Status: DC | PRN
  Administered 2022-01-20: 50 mL

## 2022-01-20 MED ORDER — VERAPAMIL HCL 2.5 MG/ML IV SOLN
INTRA_ARTERIAL | Status: DC | PRN
  Administered 2022-01-20: 15 mL via INTRA_ARTERIAL

## 2022-01-20 MED ORDER — ACETAMINOPHEN 325 MG PO TABS
650.0000 mg | ORAL_TABLET | ORAL | Status: DC | PRN
  Administered 2022-01-20: 650 mg via ORAL
  Filled 2022-01-20: qty 2

## 2022-01-20 MED ORDER — NITROGLYCERIN 1 MG/10 ML FOR IR/CATH LAB
INTRA_ARTERIAL | Status: AC
  Filled 2022-01-20: qty 10

## 2022-01-20 MED ORDER — SODIUM CHLORIDE 0.9% FLUSH: 3.0000 mL | INTRAVENOUS | Status: DC | PRN

## 2022-01-20 MED ORDER — ASPIRIN 81 MG PO CHEW
81.0000 mg | CHEWABLE_TABLET | Freq: Every day | ORAL | Status: DC
Start: 2022-01-21 — End: 2022-01-21

## 2022-01-20 MED ORDER — SODIUM CHLORIDE 0.9% FLUSH
3.0000 mL | Freq: Two times a day (BID) | INTRAVENOUS | Status: DC
  Administered 2022-01-20: 3 mL via INTRAVENOUS

## 2022-01-20 MED ORDER — HEPARIN (PORCINE) IN NACL 1000-0.9 UT/500ML-% IV SOLN
INTRAVENOUS | Status: AC
Start: 2022-01-20 — End: ?
  Filled 2022-01-20: qty 1000

## 2022-01-20 MED ORDER — HEPARIN SODIUM (PORCINE) 1000 UNIT/ML IJ SOLN
INTRAMUSCULAR | Status: AC
  Filled 2022-01-20: qty 10

## 2022-01-20 MED ORDER — LIDOCAINE HCL (PF) 1 % IJ SOLN
INTRAMUSCULAR | Status: DC | PRN
  Administered 2022-01-20: 20 mL

## 2022-01-20 MED ORDER — SODIUM CHLORIDE 0.9 % IV SOLN: 250.0000 mL | INTRAVENOUS | Status: DC | PRN

## 2022-01-20 MED ORDER — SODIUM CHLORIDE 0.9 % IV SOLN: INTRAVENOUS | Status: AC

## 2022-01-20 MED ORDER — ONDANSETRON HCL 4 MG/2ML IJ SOLN: 4.0000 mg | Freq: Four times a day (QID) | INTRAMUSCULAR | Status: DC | PRN

## 2022-01-20 MED ORDER — LIDOCAINE HCL (PF) 1 % IJ SOLN
INTRAMUSCULAR | Status: DC | PRN
  Administered 2022-01-20 (×2): 2 mL

## 2022-01-20 MED ORDER — DICLOFENAC SODIUM 1 % EX GEL
2.0000 g | Freq: Four times a day (QID) | CUTANEOUS | Status: DC
  Administered 2022-01-20 – 2022-01-21 (×2): 2 g via TOPICAL
  Filled 2022-01-20 (×2): qty 100

## 2022-01-20 MED ORDER — CLOPIDOGREL BISULFATE 75 MG PO TABS
75.0000 mg | ORAL_TABLET | Freq: Every day | ORAL | Status: DC
  Administered 2022-01-21: 75 mg via ORAL
  Filled 2022-01-20: qty 1

## 2022-01-20 MED ORDER — VERAPAMIL HCL 2.5 MG/ML IV SOLN
INTRAVENOUS | Status: AC
  Filled 2022-01-20: qty 2

## 2022-01-20 MED ORDER — HEPARIN (PORCINE) IN NACL 1000-0.9 UT/500ML-% IV SOLN
INTRAVENOUS | Status: DC | PRN
  Administered 2022-01-20 (×2): 500 mL

## 2022-01-20 MED ORDER — HYDRALAZINE HCL 20 MG/ML IJ SOLN: 10.0000 mg | INTRAMUSCULAR | Status: AC | PRN

## 2022-01-20 SURGICAL SUPPLY — 25 items
BALLN EMERGE MR 2.0X8 (BALLOONS) ×2
BALLOON EMERGE MR 2.0X8 (BALLOONS) IMPLANT
BAND ZEPHYR COMPRESS 30 LONG (HEMOSTASIS) ×1 IMPLANT
CATH BALLN WEDGE 5F 110CM (CATHETERS) ×1 IMPLANT
CATH INFINITI 5FR MULTPACK ANG (CATHETERS) ×1 IMPLANT
CATH OPTITORQUE TIG 4.0 5F (CATHETERS) ×1 IMPLANT
CATH SWAN GANZ 7F STRAIGHT (CATHETERS) ×1 IMPLANT
CATH VISTA GUIDE 6FR XBLAD3.5 (CATHETERS) ×1 IMPLANT
COVER PRB 48X5XTLSCP FOLD TPE (BAG) IMPLANT
COVER PROBE 5X48 (BAG) ×2
GLIDESHEATH SLEND A-KIT 6F 22G (SHEATH) ×1 IMPLANT
GUIDEWIRE PRESSURE X 175 (WIRE) ×1 IMPLANT
KIT ENCORE 26 ADVANTAGE (KITS) ×1 IMPLANT
KIT ESSENTIALS PG (KITS) ×1 IMPLANT
KIT HEART LEFT (KITS) ×2 IMPLANT
PACK CARDIAC CATHETERIZATION (CUSTOM PROCEDURE TRAY) ×2 IMPLANT
SHEATH GLIDE SLENDER 4/5FR (SHEATH) ×1 IMPLANT
SHEATH PINNACLE 5F 10CM (SHEATH) ×1 IMPLANT
SHEATH PINNACLE 6F 10CM (SHEATH) ×1 IMPLANT
SHEATH PINNACLE 7F 10CM (SHEATH) ×1 IMPLANT
TRANSDUCER W/STOPCOCK (MISCELLANEOUS) ×2 IMPLANT
TUBING CIL FLEX 10 FLL-RA (TUBING) ×2 IMPLANT
WIRE ASAHI PROWATER 180CM (WIRE) ×1 IMPLANT
WIRE EMERALD 3MM-J .035X150CM (WIRE) ×1 IMPLANT
WIRE HI TORQ VERSACORE-J 145CM (WIRE) ×1 IMPLANT

## 2022-01-20 NOTE — Interval H&P Note (Signed)
Cath Lab Visit (complete for each Cath Lab visit)  Clinical Evaluation Leading to the Procedure:   ACS: No.  Non-ACS:    Anginal Classification: CCS II  Anti-ischemic medical therapy: Maximal Therapy (2 or more classes of medications)  Non-Invasive Test Results: No non-invasive testing performed  Prior CABG: No previous CABG      History and Physical Interval Note:  01/20/2022 3:25 PM  [name] [name]  has presented today for surgery, with the diagnosis of coronary disease.  The various methods of treatment have been discussed with the patient and family. After consideration of risks, benefits and other options for treatment, the patient has consented to  Procedure(s): RIGHT/LEFT HEART CATH AND CORONARY ANGIOGRAPHY (N/A) as a surgical intervention.  The patient's history has been reviewed, patient examined, no change in status, stable for surgery.  I have reviewed the patient's chart and labs.  Questions were answered to the patient's satisfaction.     [name] [name]

## 2022-01-20 NOTE — Telephone Encounter (Signed)
TOC with Bernadene Person, NP for 07/18 at 10:05 a.m. Scheduled by Cassie Freer, PA

## 2022-01-20 NOTE — Progress Notes (Signed)
PROGRESS NOTE   [name] [name]  WNU:[id number] DOB: Oct 03, 1945 DOA: 01/17/2022 PCP: Tracey Harries, MD  Brief Narrative:  76 year old female community dwelling CAD s/p stent proximal to mid LAD 04/26/2019, prior PCI/DES 09/2016 HTN HLD Reflux  Presented to Emergency Room 01/18/2022 in the setting titration of meds x2 in the outpatient setting by Dr. Lesly Rubenstein pressures >200 systolic and exercise tolerance decreased in addition to leg swelling gaining over 5 pounds over 3 months  ED work-up CT negative for PE DVT study negative  Admitted for possible underlying patient exacerbation of acute heart failure  Hospital-Problem based course  Acute superimposed on chronic HFpEF EF this admission 60-65% ?  Anginal equivalent Presentation previously with shortness of breath has been an anginal equivalent-cardiac catheter today Continue Coreg 25 twice daily, irbesartan 300, HCTZ 12.5 daily Diuresis as per cardiology Continue Plavix 75 aspirin 81 Recommendation cautious fluid restriction and will start with 2000 cc Migraines Takes Pamelor 25 twice daily for this and Aimovig which will be resumed on discharge Pamelor causes mouth dryness-hopefully can use alternative in the outpatient setting beta-blocker Topamax or valproic acid in the outpatient setting OSA on CPAP Continue CPAP nightly-needs outpatient titration of device  DVT prophylaxis: Lovenox Code Status: Full Family Communication: No family today Disposition:  Status is: Observation The patient will require care spanning > 2 midnights and should be moved to inpatient because:   Needs cardiac cath and monitoring of fluid status   Consultants:  Cardiology  Procedures: Echocardiogram EF 7/5---60-65%  Antimicrobials: None   Subjective:  Still short of breath with moving-n.p.o. for procedure No chest pain  Objective: Vitals:   01/19/22 2039 01/19/22 2046 01/20/22 0446 01/20/22 0448  BP:  (!) 142/74 (!) 127/57   Pulse:  72 69 (!) 58   Resp: 17 19 17    Temp:  98 F (36.7 C) 97.8 F (36.6 C)   TempSrc:  Oral Oral   SpO2:  97% 94%   Weight:    78 kg  Height:        Intake/Output Summary (Last 24 hours) at 01/20/2022 0824 Last data filed at 01/19/2022 1600 Gross per 24 hour  Intake 120 ml  Output 300 ml  Net -180 ml    Filed Weights   01/19/22 0500 01/19/22 1610 01/20/22 0448  Weight: 78.2 kg 77.6 kg 78 kg    Examination:  Coherent no distress Chest is clear no wheeze no rales no rhonchi no focal deficit S1-S2 no murmur-regular rate rhythm Abdomen is obese nontender no rebound guarding Power is 5/5 no focal deficit Left knee is not swollen   Data Reviewed: personally reviewed   CBC    Component Value Date/Time   WBC 5.3 01/17/2022 1434   RBC 4.49 01/17/2022 1434   HGB 12.9 01/17/2022 1434   HGB 14.2 04/24/2019 1138   HCT 38.5 01/17/2022 1434   HCT 42.8 04/24/2019 1138   PLT 315 01/17/2022 1434   PLT 298 04/24/2019 1138   MCV 85.7 01/17/2022 1434   MCV 87 04/24/2019 1138   MCH 28.7 01/17/2022 1434   MCHC 33.5 01/17/2022 1434   RDW 13.6 01/17/2022 1434   RDW 13.4 04/24/2019 1138   LYMPHSABS 1.4 01/17/2022 1434   MONOABS 0.4 01/17/2022 1434   EOSABS 0.3 01/17/2022 1434   BASOSABS 0.0 01/17/2022 1434      Latest Ref Rng & Units 01/20/2022    4:01 AM 01/19/2022    3:53 AM 01/17/2022    2:34 PM  CMP  Glucose  70 - 99 mg/dL 932  355  732   BUN 8 - 23 mg/dL 21  16  10    Creatinine 0.44 - 1.00 mg/dL  2.02  5.42   Sodium 135 - 145 mmol/L 138  142  139   Potassium 3.5 - 5.1 mmol/L 3.9  4.3  4.1   Chloride 98 - 111 mmol/L 105  106  107   CO2 22 - 32 mmol/L 26  29  25    Calcium 8.9 - 10.3 mg/dL 9.9  7.06  9.7   Total Protein 6.5 - 8.1 g/dL   6.7   Total Bilirubin 0.3 - 1.2 mg/dL   0.5   Alkaline Phos 38 - 126 U/L   64   AST 15 - 41 U/L   19   ALT 0 - 44 U/L   15      Radiology Studies: ECHOCARDIOGRAM COMPLETE  Result Date: 01/19/2022    ECHOCARDIOGRAM REPORT   Patient Name:    [name] [name] Date of Exam: 01/19/2022 Medical Rec #:  Sheliah Hatch        Height:       65.0 in Accession #:    03/22/2022       Weight:       172.4 lb Date of Birth:  21-Sep-1945        BSA:          1.857 m Patient Age:    76 years         BP:           136/66 mmHg Patient Gender: F                HR:           68 bpm. Exam Location:  Inpatient Procedure: 2D Echo, Cardiac Doppler, Color Doppler and Intracardiac            Opacification Agent Indications:    CHF-Acute Diastolic I50.31  History:        Patient has no prior history of Echocardiogram examinations. CAD                 and NSTEMI-PCI March 2018; Risk Factors:Hypertension,                 Dyslipidemia and Sleep Apnea.  Sonographer:    09/08/1945 RDCS Referring Phys: April 2018 Leta Jungling IMPRESSIONS  1. Left ventricular ejection fraction, by estimation, is 60 to 65%. The left ventricle has normal function. The left ventricle has no regional wall motion abnormalities. Left ventricular diastolic parameters were normal.  2. Right ventricular systolic function is normal. The right ventricular size is normal. There is normal pulmonary artery systolic pressure.  3. The mitral valve is normal in structure. No evidence of mitral valve regurgitation. No evidence of mitral stenosis.  4. The aortic valve is tricuspid. Aortic valve regurgitation is trivial. No aortic stenosis is present.  5. The inferior vena cava is normal in size with greater than 50% respiratory variability, suggesting right atrial pressure of 3 mmHg. FINDINGS  Left Ventricle: Left ventricular ejection fraction, by estimation, is 60 to 65%. The left ventricle has normal function. The left ventricle has no regional wall motion abnormalities. Definity contrast agent was given IV to delineate the left ventricular  endocardial borders. The left ventricular internal cavity size was normal in size. There is no left ventricular hypertrophy. Left ventricular diastolic parameters were normal. Right  Ventricle: The right ventricular size is normal. No  increase in right ventricular wall thickness. Right ventricular systolic function is normal. There is normal pulmonary artery systolic pressure. The tricuspid regurgitant velocity is 2.33 m/s, and  with an assumed right atrial pressure of 3 mmHg, the estimated right ventricular systolic pressure is 24.7 mmHg. Left Atrium: Left atrial size was normal in size. Right Atrium: Right atrial size was normal in size. Pericardium: There is no evidence of pericardial effusion. Presence of epicardial fat layer. Mitral Valve: The mitral valve is normal in structure. No evidence of mitral valve regurgitation. No evidence of mitral valve stenosis. Tricuspid Valve: The tricuspid valve is normal in structure. Tricuspid valve regurgitation is trivial. No evidence of tricuspid stenosis. Aortic Valve: The aortic valve is tricuspid. Aortic valve regurgitation is trivial. No aortic stenosis is present. Pulmonic Valve: The pulmonic valve was normal in structure. Pulmonic valve regurgitation is not visualized. No evidence of pulmonic stenosis. Aorta: The aortic root is normal in size and structure. Venous: The inferior vena cava is normal in size with greater than 50% respiratory variability, suggesting right atrial pressure of 3 mmHg. IAS/Shunts: No atrial level shunt detected by color flow Doppler.  LEFT VENTRICLE PLAX 2D LVIDd:         4.70 cm     Diastology LVIDs:         2.70 cm     LV e' medial:    7.51 cm/s LV PW:         0.80 cm     LV E/e' medial:  7.2 LV IVS:        0.90 cm     LV e' lateral:   5.44 cm/s LVOT diam:     1.90 cm     LV E/e' lateral: 9.9 LV SV:         59 LV SV Index:   32 LVOT Area:     2.84 cm  LV Volumes (MOD) LV vol d, MOD A2C: 71.2 ml LV vol d, MOD A4C: 69.9 ml LV vol s, MOD A2C: 22.5 ml LV vol s, MOD A4C: 26.2 ml LV SV MOD A2C:     48.7 ml LV SV MOD A4C:     69.9 ml LV SV MOD BP:      45.5 ml RIGHT VENTRICLE RV S prime:     13.50 cm/s TAPSE (M-mode): 2.2 cm  LEFT ATRIUM             Index        RIGHT ATRIUM          Index LA diam:        3.90 cm 2.10 cm/m   RA Area:     9.79 cm LA Vol (A2C):   44.8 ml 24.12 ml/m  RA Volume:   18.40 ml 9.91 ml/m LA Vol (A4C):   42.0 ml 22.62 ml/m LA Biplane Vol: 44.0 ml 23.69 ml/m  AORTIC VALVE LVOT Vmax:   102.00 cm/s LVOT Vmean:  65.200 cm/s LVOT VTI:    0.208 m  AORTA Ao Root diam: 3.00 cm Ao Asc diam:  3.40 cm MITRAL VALVE               TRICUSPID VALVE MV Area (PHT): 3.12 cm    TR Peak grad:   21.7 mmHg MV Decel Time: 243 msec    TR Vmax:        233.00 cm/s MV E velocity: 54.00 cm/s MV A velocity: 70.30 cm/s  SHUNTS MV E/A ratio:  0.77  Systemic VTI:  0.21 m                            Systemic Diam: 1.90 cm Kardie Tobb DO Electronically signed by Thomasene Ripple DO Signature Date/Time: 01/19/2022/12:52:39 PM    Final      Scheduled Meds:  aspirin EC  81 mg Oral Daily   carvedilol  25 mg Oral BID WC   clopidogrel  75 mg Oral Daily   enoxaparin (LOVENOX) injection  40 mg Subcutaneous QHS   hydrochlorothiazide  12.5 mg Oral Daily   irbesartan  300 mg Oral QHS   loratadine  10 mg Oral Daily   nortriptyline  25 mg Oral BID   pantoprazole  40 mg Oral Daily   sertraline  100 mg Oral Daily   sodium chloride flush  3 mL Intravenous Q12H   sodium chloride flush  3 mL Intravenous Q12H   Continuous Infusions:  sodium chloride     sodium chloride     sodium chloride 10 mL/hr at 01/20/22 0706     LOS: 0 days   Time spent: 39  Rhetta Mura, MD [facility] To contact the attending provider between 7A-7P or the covering provider during after hours 7P-7A, please log into the web site [email or web address] and access using universal [place] password for that web site. If you do not have the password, please call the hospital operator.  01/20/2022, 8:24 AM

## 2022-01-20 NOTE — H&P (View-Only) (Signed)
Progress Note  Patient Name: [name] [name] Date of Encounter: 01/20/2022  [facility] Cardiologist: [name], MD   Subjective   Still getting SOB w/ ambulation  Inpatient Medications    Scheduled Meds:  aspirin EC  81 mg Oral Daily   carvedilol  25 mg Oral BID WC   clopidogrel  75 mg Oral Daily   enoxaparin (LOVENOX) injection  40 mg Subcutaneous QHS   hydrochlorothiazide  12.5 mg Oral Daily   irbesartan  300 mg Oral QHS   loratadine  10 mg Oral Daily   nortriptyline  25 mg Oral BID   pantoprazole  40 mg Oral Daily   sertraline  100 mg Oral Daily   sodium chloride flush  3 mL Intravenous Q12H   sodium chloride flush  3 mL Intravenous Q12H   Continuous Infusions:  sodium chloride     sodium chloride     sodium chloride 10 mL/hr at 01/20/22 0706   PRN Meds: sodium chloride, sodium chloride, acetaminophen, fluticasone, ondansetron (ZOFRAN) IV, sodium chloride flush, sodium chloride flush   Vital Signs    Vitals:   01/19/22 2039 01/19/22 2046 01/20/22 0446 01/20/22 0448  BP:  (!) 142/74 (!) 127/57   Pulse: 72 69 (!) 58   Resp: 17 19 17    Temp:  98 F (36.7 C) 97.8 F (36.6 C)   TempSrc:  Oral Oral   SpO2:  97% 94%   Weight:    78 kg  Height:        Intake/Output Summary (Last 24 hours) at 01/20/2022 1007 Last data filed at 01/19/2022 1600 Gross per 24 hour  Intake --  Output 300 ml  Net -300 ml      01/20/2022    4:48 AM 01/19/2022    4:10 PM 01/19/2022    5:00 AM  Last 3 Weights  Weight (lbs) 172 lb 171 lb 1.2 oz 172 lb 6.4 oz  Weight (kg) 78.019 kg 77.6 kg 78.2 kg      Telemetry    SR - Personally Reviewed  ECG    None today - Personally Reviewed  Physical Exam   GEN: No acute distress.   Neck: No JVD Cardiac: RRR, no murmurs, rubs, or gallops.  Respiratory: Clear to auscultation bilaterally. GI: Soft, nontender, non-distended  MS: No edema; No deformity. Neuro:  Nonfocal  Psych: Normal affect   Labs    High Sensitivity  Troponin:   Recent Labs  Lab 01/17/22 1434 01/17/22 1645  TROPONINIHS 4 4     Chemistry Recent Labs  Lab 01/17/22 1434 01/19/22 0353 01/20/22 0401  NA 139 142 138  K 4.1 4.3 3.9  CL 107 106 105  CO2 25 29 26   GLUCOSE 101* 104* 105*  BUN 10 16 21   CREATININE 0.71 0.93 0.88  CALCIUM 9.7 10.4* 9.9  PROT 6.7  --   --   ALBUMIN 3.5  --   --   AST 19  --   --   ALT 15  --   --   ALKPHOS 64  --   --   BILITOT 0.5  --   --   GFRNONAA >60 >60 >60  ANIONGAP 7 7 7     Lipids No results for input(s): "CHOL", "TRIG", "HDL", "LABVLDL", "LDLCALC", "CHOLHDL" in the last 168 hours.  Hematology Recent Labs  Lab 01/17/22 1434  WBC 5.3  RBC 4.49  HGB 12.9  HCT 38.5  MCV 85.7  MCH 28.7  MCHC 33.5  RDW 13.6  PLT 315   Thyroid No results for input(s): "TSH", "FREET4" in the last 168 hours.  BNP Recent Labs  Lab 01/17/22 1434  BNP 115.8*    DDimer No results for input(s): "DDIMER" in the last 168 hours.  No results found for: "HGBA1C"   Radiology    ECHOCARDIOGRAM COMPLETE  Result Date: 01/19/2022    ECHOCARDIOGRAM REPORT   Patient Name:   [name] [name] Date of Exam: 01/19/2022 Medical Rec #:  [id number]        Height:       65.0 in Accession #:    [id number]       Weight:       172.4 lb Date of Birth:  Sep 20, 1945        BSA:          1.857 m Patient Age:    76 years         BP:           136/66 mmHg Patient Gender: F                HR:           68 bpm. Exam Location:  Inpatient Procedure: 2D Echo, Cardiac Doppler, Color Doppler and Intracardiac            Opacification Agent Indications:    CHF-Acute Diastolic I50.31  History:        Patient has no prior history of Echocardiogram examinations. CAD                 and NSTEMI-PCI March 2018; Risk Factors:Hypertension,                 Dyslipidemia and Sleep Apnea.  Sonographer:    Leta Jungling RDCS Referring Phys: [id number] Emeline General IMPRESSIONS  1. Left ventricular ejection fraction, by estimation, is 60 to 65%. The left  ventricle has normal function. The left ventricle has no regional wall motion abnormalities. Left ventricular diastolic parameters were normal.  2. Right ventricular systolic function is normal. The right ventricular size is normal. There is normal pulmonary artery systolic pressure.  3. The mitral valve is normal in structure. No evidence of mitral valve regurgitation. No evidence of mitral stenosis.  4. The aortic valve is tricuspid. Aortic valve regurgitation is trivial. No aortic stenosis is present.  5. The inferior vena cava is normal in size with greater than 50% respiratory variability, suggesting right atrial pressure of 3 mmHg. FINDINGS  Left Ventricle: Left ventricular ejection fraction, by estimation, is 60 to 65%. The left ventricle has normal function. The left ventricle has no regional wall motion abnormalities. Definity contrast agent was given IV to delineate the left ventricular  endocardial borders. The left ventricular internal cavity size was normal in size. There is no left ventricular hypertrophy. Left ventricular diastolic parameters were normal. Right Ventricle: The right ventricular size is normal. No increase in right ventricular wall thickness. Right ventricular systolic function is normal. There is normal pulmonary artery systolic pressure. The tricuspid regurgitant velocity is 2.33 m/s, and  with an assumed right atrial pressure of 3 mmHg, the estimated right ventricular systolic pressure is 24.7 mmHg. Left Atrium: Left atrial size was normal in size. Right Atrium: Right atrial size was normal in size. Pericardium: There is no evidence of pericardial effusion. Presence of epicardial fat layer. Mitral Valve: The mitral valve is normal in structure. No evidence of mitral valve regurgitation. No evidence of mitral valve stenosis.  Tricuspid Valve: The tricuspid valve is normal in structure. Tricuspid valve regurgitation is trivial. No evidence of tricuspid stenosis. Aortic Valve: The aortic  valve is tricuspid. Aortic valve regurgitation is trivial. No aortic stenosis is present. Pulmonic Valve: The pulmonic valve was normal in structure. Pulmonic valve regurgitation is not visualized. No evidence of pulmonic stenosis. Aorta: The aortic root is normal in size and structure. Venous: The inferior vena cava is normal in size with greater than 50% respiratory variability, suggesting right atrial pressure of 3 mmHg. IAS/Shunts: No atrial level shunt detected by color flow Doppler.  LEFT VENTRICLE PLAX 2D LVIDd:         4.70 cm     Diastology LVIDs:         2.70 cm     LV e' medial:    7.51 cm/s LV PW:         0.80 cm     LV E/e' medial:  7.2 LV IVS:        0.90 cm     LV e' lateral:   5.44 cm/s LVOT diam:     1.90 cm     LV E/e' lateral: 9.9 LV SV:         59 LV SV Index:   32 LVOT Area:     2.84 cm  LV Volumes (MOD) LV vol d, MOD A2C: 71.2 ml LV vol d, MOD A4C: 69.9 ml LV vol s, MOD A2C: 22.5 ml LV vol s, MOD A4C: 26.2 ml LV SV MOD A2C:     48.7 ml LV SV MOD A4C:     69.9 ml LV SV MOD BP:      45.5 ml RIGHT VENTRICLE RV S prime:     13.50 cm/s TAPSE (M-mode): 2.2 cm LEFT ATRIUM             Index        RIGHT ATRIUM          Index LA diam:        3.90 cm 2.10 cm/m   RA Area:     9.79 cm LA Vol (A2C):   44.8 ml 24.12 ml/m  RA Volume:   18.40 ml 9.91 ml/m LA Vol (A4C):   42.0 ml 22.62 ml/m LA Biplane Vol: 44.0 ml 23.69 ml/m  AORTIC VALVE LVOT Vmax:   102.00 cm/s LVOT Vmean:  65.200 cm/s LVOT VTI:    0.208 m  AORTA Ao Root diam: 3.00 cm Ao Asc diam:  3.40 cm MITRAL VALVE               TRICUSPID VALVE MV Area (PHT): 3.12 cm    TR Peak grad:   21.7 mmHg MV Decel Time: 243 msec    TR Vmax:        233.00 cm/s MV E velocity: 54.00 cm/s MV A velocity: 70.30 cm/s  SHUNTS MV E/A ratio:  0.77        Systemic VTI:  0.21 m                            Systemic Diam: 1.90 cm Kardie Tobb DO Electronically signed by Thomasene Ripple DO Signature Date/Time: 01/19/2022/12:52:39 PM    Final     Cardiac Studies   CARDIAC  CATH: ordered  ECHO: 01/19/2022  1. Left ventricular ejection fraction, by estimation, is 60 to 65%. The  left ventricle has normal function. The left ventricle has no regional  wall motion abnormalities. Left ventricular diastolic parameters were  normal.   2. Right ventricular systolic function is normal. The right ventricular  size is normal. There is normal pulmonary artery systolic pressure.   3. The mitral valve is normal in structure. No evidence of mitral valve  regurgitation. No evidence of mitral stenosis.   4. The aortic valve is tricuspid. Aortic valve regurgitation is trivial.  No aortic stenosis is present.   5. The inferior vena cava is normal in size with greater than 50%  respiratory variability, suggesting right atrial pressure of 3 mmHg.   Patient Profile     76 y.o. female with a hx of CAD, hypertension, hyperlipidemia, and obstructive sleep apnea on CPAP therapy, was admitted 07/04 with SOB, Cards saw 07/05 for same.    Assessment & Plan    CAD:  - prior to 04/2019 cath, she was having DOE and chest pain, cath w/ POBA Diag 1. Dr Allyson Sabal said to consider 2.25 mm stent at the ostium of the diag branch if she had restenosis  - concern for DOE as anginal equivalent >> for cath today - no concerns about the procedure  2. Volume overload - pt SOB treated w/ a 1-time dose Lasix 20 mg on admit - wt down 2 kg and sx improved some.  - echo w/ normal R heart pressures, RVSP 24.7 - Pt started on HCTZ 12.5 mg qd  - K+ has dropped a little, to 3.9, continue to follow, may need intermittent supplement  Otherwise, per IM   For questions or updates, please contact [facility] Please consult [email or web address] for contact info under        Signed, Theodore Demark, PA-C  01/20/2022, 10:07 AM     I have seen and examined the patient along with Theodore Demark, PA-C .  I have reviewed the chart, notes and new data.  I agree with PA/NP's note.  Key new complaints: Does not  have angina or dyspnea at rest, but becomes dyspneic with minimal activity such as walking to the bathroom. Key examination changes: Normal cardiovascular exam, no overt hypervolemia (did have edema on admission). Key new findings / data: Shows normal left ventricular systolic function and regional wall motion and even normal diastolic filling pressures based on mitral inflow/tissue Doppler ratios.  PLAN: Dyspnea may be an anginal equivalent (she had similar presentation with LAD stenosis in the past).  Scheduled for right and left heart catheterization and possible angioplasty-stent with Dr. Allyson Sabal this afternoon. This procedure has been fully reviewed with the patient and written informed consent has been obtained.    Thurmon Fair, MD, [facility] [phone] 01/20/2022, 11:58 AM

## 2022-01-20 NOTE — Progress Notes (Addendum)
Progress Note  Patient Name: [name] [name] Date of Encounter: 01/20/2022  [facility] Cardiologist: [name], MD   Subjective   Still getting SOB w/ ambulation  Inpatient Medications    Scheduled Meds:  aspirin EC  81 mg Oral Daily   carvedilol  25 mg Oral BID WC   clopidogrel  75 mg Oral Daily   enoxaparin (LOVENOX) injection  40 mg Subcutaneous QHS   hydrochlorothiazide  12.5 mg Oral Daily   irbesartan  300 mg Oral QHS   loratadine  10 mg Oral Daily   nortriptyline  25 mg Oral BID   pantoprazole  40 mg Oral Daily   sertraline  100 mg Oral Daily   sodium chloride flush  3 mL Intravenous Q12H   sodium chloride flush  3 mL Intravenous Q12H   Continuous Infusions:  sodium chloride     sodium chloride     sodium chloride 10 mL/hr at 01/20/22 0706   PRN Meds: sodium chloride, sodium chloride, acetaminophen, fluticasone, ondansetron (ZOFRAN) IV, sodium chloride flush, sodium chloride flush   Vital Signs    Vitals:   01/19/22 2039 01/19/22 2046 01/20/22 0446 01/20/22 0448  BP:  (!) 142/74 (!) 127/57   Pulse: 72 69 (!) 58   Resp: 17 19 17    Temp:  98 F (36.7 C) 97.8 F (36.6 C)   TempSrc:  Oral Oral   SpO2:  97% 94%   Weight:    78 kg  Height:        Intake/Output Summary (Last 24 hours) at 01/20/2022 1007 Last data filed at 01/19/2022 1600 Gross per 24 hour  Intake --  Output 300 ml  Net -300 ml      01/20/2022    4:48 AM 01/19/2022    4:10 PM 01/19/2022    5:00 AM  Last 3 Weights  Weight (lbs) 172 lb 171 lb 1.2 oz 172 lb 6.4 oz  Weight (kg) 78.019 kg 77.6 kg 78.2 kg      Telemetry    SR - Personally Reviewed  ECG    None today - Personally Reviewed  Physical Exam   GEN: No acute distress.   Neck: No JVD Cardiac: RRR, no murmurs, rubs, or gallops.  Respiratory: Clear to auscultation bilaterally. GI: Soft, nontender, non-distended  MS: No edema; No deformity. Neuro:  Nonfocal  Psych: Normal affect   Labs    High Sensitivity  Troponin:   Recent Labs  Lab 01/17/22 1434 01/17/22 1645  TROPONINIHS 4 4     Chemistry Recent Labs  Lab 01/17/22 1434 01/19/22 0353 01/20/22 0401  NA 139 142 138  K 4.1 4.3 3.9  CL 107 106 105  CO2 25 29 26   GLUCOSE 101* 104* 105*  BUN 10 16 21   CREATININE 0.71 0.93 0.88  CALCIUM 9.7 10.4* 9.9  PROT 6.7  --   --   ALBUMIN 3.5  --   --   AST 19  --   --   ALT 15  --   --   ALKPHOS 64  --   --   BILITOT 0.5  --   --   GFRNONAA >60 >60 >60  ANIONGAP 7 7 7     Lipids No results for input(s): "CHOL", "TRIG", "HDL", "LABVLDL", "LDLCALC", "CHOLHDL" in the last 168 hours.  Hematology Recent Labs  Lab 01/17/22 1434  WBC 5.3  RBC 4.49  HGB 12.9  HCT 38.5  MCV 85.7  MCH 28.7  MCHC 33.5  RDW 13.6  PLT 315   Thyroid No results for input(s): "TSH", "FREET4" in the last 168 hours.  BNP Recent Labs  Lab 01/17/22 1434  BNP 115.8*    DDimer No results for input(s): "DDIMER" in the last 168 hours.  No results found for: "HGBA1C"   Radiology    ECHOCARDIOGRAM COMPLETE  Result Date: 01/19/2022    ECHOCARDIOGRAM REPORT   Patient Name:   [name] [name] Date of Exam: 01/19/2022 Medical Rec #:  [id number]        Height:       65.0 in Accession #:    [id number]       Weight:       172.4 lb Date of Birth:  Sep 20, 1945        BSA:          1.857 m Patient Age:    76 years         BP:           136/66 mmHg Patient Gender: F                HR:           68 bpm. Exam Location:  Inpatient Procedure: 2D Echo, Cardiac Doppler, Color Doppler and Intracardiac            Opacification Agent Indications:    CHF-Acute Diastolic I50.31  History:        Patient has no prior history of Echocardiogram examinations. CAD                 and NSTEMI-PCI March 2018; Risk Factors:Hypertension,                 Dyslipidemia and Sleep Apnea.  Sonographer:    Leta Jungling RDCS Referring Phys: [id number] Emeline General IMPRESSIONS  1. Left ventricular ejection fraction, by estimation, is 60 to 65%. The left  ventricle has normal function. The left ventricle has no regional wall motion abnormalities. Left ventricular diastolic parameters were normal.  2. Right ventricular systolic function is normal. The right ventricular size is normal. There is normal pulmonary artery systolic pressure.  3. The mitral valve is normal in structure. No evidence of mitral valve regurgitation. No evidence of mitral stenosis.  4. The aortic valve is tricuspid. Aortic valve regurgitation is trivial. No aortic stenosis is present.  5. The inferior vena cava is normal in size with greater than 50% respiratory variability, suggesting right atrial pressure of 3 mmHg. FINDINGS  Left Ventricle: Left ventricular ejection fraction, by estimation, is 60 to 65%. The left ventricle has normal function. The left ventricle has no regional wall motion abnormalities. Definity contrast agent was given IV to delineate the left ventricular  endocardial borders. The left ventricular internal cavity size was normal in size. There is no left ventricular hypertrophy. Left ventricular diastolic parameters were normal. Right Ventricle: The right ventricular size is normal. No increase in right ventricular wall thickness. Right ventricular systolic function is normal. There is normal pulmonary artery systolic pressure. The tricuspid regurgitant velocity is 2.33 m/s, and  with an assumed right atrial pressure of 3 mmHg, the estimated right ventricular systolic pressure is 24.7 mmHg. Left Atrium: Left atrial size was normal in size. Right Atrium: Right atrial size was normal in size. Pericardium: There is no evidence of pericardial effusion. Presence of epicardial fat layer. Mitral Valve: The mitral valve is normal in structure. No evidence of mitral valve regurgitation. No evidence of mitral valve stenosis.  Tricuspid Valve: The tricuspid valve is normal in structure. Tricuspid valve regurgitation is trivial. No evidence of tricuspid stenosis. Aortic Valve: The aortic  valve is tricuspid. Aortic valve regurgitation is trivial. No aortic stenosis is present. Pulmonic Valve: The pulmonic valve was normal in structure. Pulmonic valve regurgitation is not visualized. No evidence of pulmonic stenosis. Aorta: The aortic root is normal in size and structure. Venous: The inferior vena cava is normal in size with greater than 50% respiratory variability, suggesting right atrial pressure of 3 mmHg. IAS/Shunts: No atrial level shunt detected by color flow Doppler.  LEFT VENTRICLE PLAX 2D LVIDd:         4.70 cm     Diastology LVIDs:         2.70 cm     LV e' medial:    7.51 cm/s LV PW:         0.80 cm     LV E/e' medial:  7.2 LV IVS:        0.90 cm     LV e' lateral:   5.44 cm/s LVOT diam:     1.90 cm     LV E/e' lateral: 9.9 LV SV:         59 LV SV Index:   32 LVOT Area:     2.84 cm  LV Volumes (MOD) LV vol d, MOD A2C: 71.2 ml LV vol d, MOD A4C: 69.9 ml LV vol s, MOD A2C: 22.5 ml LV vol s, MOD A4C: 26.2 ml LV SV MOD A2C:     48.7 ml LV SV MOD A4C:     69.9 ml LV SV MOD BP:      45.5 ml RIGHT VENTRICLE RV S prime:     13.50 cm/s TAPSE (M-mode): 2.2 cm LEFT ATRIUM             Index        RIGHT ATRIUM          Index LA diam:        3.90 cm 2.10 cm/m   RA Area:     9.79 cm LA Vol (A2C):   44.8 ml 24.12 ml/m  RA Volume:   18.40 ml 9.91 ml/m LA Vol (A4C):   42.0 ml 22.62 ml/m LA Biplane Vol: 44.0 ml 23.69 ml/m  AORTIC VALVE LVOT Vmax:   102.00 cm/s LVOT Vmean:  65.200 cm/s LVOT VTI:    0.208 m  AORTA Ao Root diam: 3.00 cm Ao Asc diam:  3.40 cm MITRAL VALVE               TRICUSPID VALVE MV Area (PHT): 3.12 cm    TR Peak grad:   21.7 mmHg MV Decel Time: 243 msec    TR Vmax:        233.00 cm/s MV E velocity: 54.00 cm/s MV A velocity: 70.30 cm/s  SHUNTS MV E/A ratio:  0.77        Systemic VTI:  0.21 m                            Systemic Diam: 1.90 cm Kardie Tobb DO Electronically signed by Thomasene Ripple DO Signature Date/Time: 01/19/2022/12:52:39 PM    Final     Cardiac Studies   CARDIAC  CATH: ordered  ECHO: 01/19/2022  1. Left ventricular ejection fraction, by estimation, is 60 to 65%. The  left ventricle has normal function. The left ventricle has no regional  wall motion abnormalities. Left ventricular diastolic parameters were  normal.   2. Right ventricular systolic function is normal. The right ventricular  size is normal. There is normal pulmonary artery systolic pressure.   3. The mitral valve is normal in structure. No evidence of mitral valve  regurgitation. No evidence of mitral stenosis.   4. The aortic valve is tricuspid. Aortic valve regurgitation is trivial.  No aortic stenosis is present.   5. The inferior vena cava is normal in size with greater than 50%  respiratory variability, suggesting right atrial pressure of 3 mmHg.   Patient Profile     76 y.o. female with a hx of CAD, hypertension, hyperlipidemia, and obstructive sleep apnea on CPAP therapy, was admitted 07/04 with SOB, Cards saw 07/05 for same.    Assessment & Plan    CAD:  - prior to 04/2019 cath, she was having DOE and chest pain, cath w/ POBA Diag 1. Dr Allyson Sabal said to consider 2.25 mm stent at the ostium of the diag branch if she had restenosis  - concern for DOE as anginal equivalent >> for cath today - no concerns about the procedure  2. Volume overload - pt SOB treated w/ a 1-time dose Lasix 20 mg on admit - wt down 2 kg and sx improved some.  - echo w/ normal R heart pressures, RVSP 24.7 - Pt started on HCTZ 12.5 mg qd  - K+ has dropped a little, to 3.9, continue to follow, may need intermittent supplement  Otherwise, per IM   For questions or updates, please contact [facility] Please consult [email or web address] for contact info under        Signed, Theodore Demark, PA-C  01/20/2022, 10:07 AM     I have seen and examined the patient along with Theodore Demark, PA-C .  I have reviewed the chart, notes and new data.  I agree with PA/NP's note.  Key new complaints: Does not  have angina or dyspnea at rest, but becomes dyspneic with minimal activity such as walking to the bathroom. Key examination changes: Normal cardiovascular exam, no overt hypervolemia (did have edema on admission). Key new findings / data: Shows normal left ventricular systolic function and regional wall motion and even normal diastolic filling pressures based on mitral inflow/tissue Doppler ratios.  PLAN: Dyspnea may be an anginal equivalent (she had similar presentation with LAD stenosis in the past).  Scheduled for right and left heart catheterization and possible angioplasty-stent with Dr. Allyson Sabal this afternoon. This procedure has been fully reviewed with the patient and written informed consent has been obtained.    Thurmon Fair, MD, [facility] [phone] 01/20/2022, 11:58 AM

## 2022-01-20 NOTE — Progress Notes (Signed)
Patient noticed to have blanchable redness, with small petechiae, on bridge of nose.  Patient reports this is due to hospital CPAP machine.  Informed patient that we will cover area with foam prior to using CPAP tonight.  Bradd Burner, RN

## 2022-01-20 NOTE — Progress Notes (Signed)
Site area: rt groin arterial sheath Site Prior to Removal:  Level 0 Pressure Applied For: 25 minutes Manual:   yes Patient Status During Pull:  stable Post Pull Site:  Level 0 Post Pull Instructions Given:  yes Post Pull Pulses Present: rt dp palpable Dressing Applied:  gauze and tegaderm Bedrest begins @ 1920  Comments:

## 2022-01-21 ENCOUNTER — Encounter (HOSPITAL_COMMUNITY): Payer: Self-pay | Admitting: Cardiovascular Disease

## 2022-01-21 ENCOUNTER — Ambulatory Visit: Payer: Medicare Other | Admitting: Cardiovascular Disease

## 2022-01-21 DIAGNOSIS — I2510 Atherosclerotic heart disease of native coronary artery without angina pectoris: Secondary | ICD-10-CM

## 2022-01-21 DIAGNOSIS — T466X5A Adverse effect of antihyperlipidemic and antiarteriosclerotic drugs, initial encounter: Secondary | ICD-10-CM

## 2022-01-21 DIAGNOSIS — I5031 Acute diastolic (congestive) heart failure: Secondary | ICD-10-CM | POA: Diagnosis not present

## 2022-01-21 DIAGNOSIS — Z20822 Contact with and (suspected) exposure to covid-19: Secondary | ICD-10-CM | POA: Diagnosis not present

## 2022-01-21 DIAGNOSIS — I509 Heart failure, unspecified: Secondary | ICD-10-CM | POA: Diagnosis not present

## 2022-01-21 DIAGNOSIS — I252 Old myocardial infarction: Secondary | ICD-10-CM | POA: Diagnosis not present

## 2022-01-21 DIAGNOSIS — G720 Drug-induced myopathy: Secondary | ICD-10-CM

## 2022-01-21 DIAGNOSIS — E7800 Pure hypercholesterolemia, unspecified: Secondary | ICD-10-CM | POA: Diagnosis not present

## 2022-01-21 LAB — LIPID PANEL
Cholesterol: 133 mg/dL (ref 0–200)
HDL: 42 mg/dL (ref >40)
LDL Cholesterol: 54 mg/dL (ref 0–99)
Total CHOL/HDL Ratio: 3.2 RATIO
Triglycerides: 185 mg/dL — ABNORMAL HIGH (ref <150)
VLDL: 37 mg/dL (ref 0–40)

## 2022-01-21 LAB — CBC
HCT: 37.6 % (ref 36.0–46.0)
Hemoglobin: 12.5 g/dL (ref 12.0–15.0)
MCH: 28.6 pg (ref 26.0–34.0)
MCHC: 33.2 g/dL (ref 30.0–36.0)
MCV: 86 fL (ref 80.0–100.0)
Platelets: 264 10*3/uL (ref 150–400)
RBC: 4.37 MIL/uL (ref 3.87–5.11)
RDW: 13.5 % (ref 11.5–15.5)
WBC: 8.5 10*3/uL (ref 4.0–10.5)
nRBC: 0 % (ref 0.0–0.2)

## 2022-01-21 LAB — BASIC METABOLIC PANEL
Anion gap: 11 (ref 5–15)
BUN: 18 mg/dL (ref 8–23)
CO2: 19 mmol/L — ABNORMAL LOW (ref 22–32)
Calcium: 9.4 mg/dL (ref 8.9–10.3)
Chloride: 108 mmol/L (ref 98–111)
Creatinine, Ser: 0.73 mg/dL (ref 0.44–1.00)
GFR, Estimated: >60 mL/min (ref >60)
Glucose, Bld: 90 mg/dL (ref 70–99)
Potassium: 3.5 mmol/L (ref 3.5–5.1)
Sodium: 138 mmol/L (ref 135–145)

## 2022-01-21 MED ORDER — POTASSIUM CHLORIDE CRYS ER 20 MEQ PO TBCR
40.0000 meq | EXTENDED_RELEASE_TABLET | Freq: Once | ORAL | Status: AC
  Administered 2022-01-21: 40 meq via ORAL
  Filled 2022-01-21: qty 2

## 2022-01-21 MED ORDER — AMLODIPINE BESYLATE 5 MG PO TABS: 5.0000 mg | ORAL_TABLET | Freq: Every day | ORAL | 3 refills | Status: DC

## 2022-01-21 NOTE — Telephone Encounter (Signed)
Pt went to ED on 7/3 and was cathed 7/6.

## 2022-01-21 NOTE — Progress Notes (Signed)
Received from Cath lab with Dorothyann Gibbs, RN via stretcher. Seen approximately 4 cm hematoma ( site is raised and hard to touch) on right groin. Manual pressure applied by Dorothyann Gibbs, RN for 20 mins. At 2000H, site is soft and flat . Informed S. Okey Dupre, MD. Will monitor.

## 2022-01-21 NOTE — Progress Notes (Signed)
CARDIAC REHAB PHASE I   PRE:  Rate/Rhythm: 76 SR  BP:  Sitting: 116/58      SaO2: 92 RA  MODE:  Ambulation: 200 ft   POST:  Rate/Rhythm: 91 SR  BP:  Sitting: 141/71    SaO2: 99 RA   Pt ambulated 219ft in hallway standby assist with steady gait. Pt states mild SOB, but much improved since procedure. Pt denies CP or dizziness throughout. Pt returned to bed. Pt educated on importance of ASA and Plavix. Pt given heart healthy diet. Reviewed site care, restrictions, and exercise guidelines. Will refer to CRP II [place].  [phone] Reynold Bowen, RN BSN 01/21/2022 9:08 AM

## 2022-01-21 NOTE — Discharge Summary (Signed)
[facility]  Physician Discharge Summary   Patient ID: [name] [name] MRN: [id number] DOB/AGE: 76-Apr-1947 76 y.o.  Admit date: 01/17/2022 Discharge date: 01/21/2022    PCP: Bernerd Limbo, MD  DISCHARGE DIAGNOSES:  Principal Problem:   Acute CHF [facility]) Active Problems:   Coronary artery disease involving native coronary artery of native heart without angina pectoris   CHF (congestive heart failure) ([place])   RECOMMENDATIONS FOR OUTPATIENT FOLLOW UP: Cardiology to arrange outpatient follow-up   Home Health: None Equipment/Devices: None  CODE STATUS: Full code  DISCHARGE CONDITION: fair  Diet recommendation: Heart healthy  INITIAL HISTORY: 76 year old female with past medical history of coronary artery disease essential hypertension hyperlipidemia presented with complaints of elevated blood pressure.  There was concern for acute CHF.  She was hospitalized for further management.  Consultations: Cardiology  Procedures: Cardiac catheterization Echocardiogram   HOSPITAL COURSE:   Coronary artery disease/acute on chronic diastolic CHF  It was felt that her symptoms were anginal equivalent.  Patient was hospitalized.  Seen by cardiology.  Echocardiogram showed normal systolic function.  Underwent cardiac catheterization.  95% stenosis of the first diagonal was noted.  Balloon angioplasty was performed.  Patient's symptoms improved.  She has been cleared by cardiology for discharge.  Her other medical issues are all stable.  She is stable for discharge today.   PERTINENT LABS:  The results of significant diagnostics from this hospitalization (including imaging, microbiology, ancillary and laboratory) are listed below for reference.    Microbiology: Recent Results (from the past 240 hour(s))  SARS CORONAVIRUS 2 (TAT 6-24 HRS) Anterior Nasal Swab     Status: None   Collection Time: 01/20/22  5:19 AM   Specimen: Anterior Nasal Swab  Result Value Ref Range Status    SARS Coronavirus 2 NEGATIVE NEGATIVE Final    Comment: (NOTE) SARS-CoV-2 target nucleic acids are NOT DETECTED.  The SARS-CoV-2 RNA is generally detectable in upper and lower respiratory specimens during the acute phase of infection. Negative results do not preclude SARS-CoV-2 infection, do not rule out co-infections with other pathogens, and should not be used as the sole basis for treatment or other patient management decisions. Negative results must be combined with clinical observations, patient history, and epidemiological information. The expected result is Negative.  Fact Sheet for Patients: SugarRoll.be  Fact Sheet for Healthcare Providers: https://www.woods-mathews.com/  This test is not yet approved or cleared by the [place] FDA and  has been authorized for detection and/or diagnosis of SARS-CoV-2 by FDA under an Emergency Use Authorization (EUA). This EUA will remain  in effect (meaning this test can be used) for the duration of the COVID-19 declaration under Se ction 564(b)(1) of the Act, 21 U.S.C. section 360bbb-3(b)(1), unless the authorization is terminated or revoked sooner.  Performed at [facility], [place] [address] [place], [place] [address]      Labs:   Basic Metabolic Panel: Recent Labs  Lab 01/17/22 1434 01/19/22 0353 01/20/22 0401 01/21/22 0253  NA 139 142 138 138  K 4.1 4.3 3.9 3.5  CL 107 106 105 108  CO2 _0 19*  GLUCOSE 101* 104* 105* 90  BUN _1 CREATININE 0.71 0.93 0.88 0.73  CALCIUM 9.7 10.4* 9.9 9.4   Liver Function Tests: Recent Labs  Lab 01/17/22 1434  AST 19  ALT 15  ALKPHOS 64  BILITOT 0.5  PROT 6.7  ALBUMIN 3.5    CBC: Recent Labs  Lab 01/17/22 1434 01/21/22 0253  WBC 5.3 8.5  NEUTROABS 3.1  --   HGB 12.9 12.5  HCT 38.5 37.6  MCV 85.7 86.0  PLT 315 264    BNP: BNP (last 3 results) Recent Labs    01/17/22 1434  BNP 115.8*       IMAGING STUDIES CARDIAC CATHETERIZATION  Result Date: 01/20/2022   Mid LAD lesion is 60% stenosed.   1st Diag lesion is 95% stenosed.   Non-stenotic Prox LAD to Mid LAD lesion was previously treated.   Balloon angioplasty was performed.   Post intervention, there is a 25% residual stenosis. [name] [name] is a 76 y.o. female  [id number] LOCATION:  FACILITY: [facility] PHYSICIAN: Quay Burow, M.D. Oct 09, 1945 DATE OF PROCEDURE:  01/20/2022 DATE OF DISCHARGE: CARDIAC CATHETERIZATION / POBA D1 History obtained from chart review.76 y.o. female with a hx of CAD, hypertension, hyperlipidemia, and obstructive sleep apnea on CPAP therapy, was admitted 07/04 with SOB,.  She had LAD restenting by myself in 2019 and first diagonal branch ostial PTCA in 2020.  She was admitted with similar symptoms as prior to her diagonal branch intervention 3 years ago.  Her 2D echo was normal with no evidence of diastolic dysfunction.  She presents now for diagnostic coronary angiography. PROCEDURE DESCRIPTION: The patient was brought to the second floor [facility] cath lab in the postabsorptive state.  She was not premedicated.  Her right groin was prepped and shaved in usual sterile fashion. Xylocaine 1% was used for local anesthesia. A 5 French sheath was inserted into the right common femoral artery using standard Seldinger technique.  I did access the right radial artery under ultrasound guidance and demonstrated good blood return through the SideArm sheath however when I passed the catheter there was excessive spasm and therefore I defaulted to the femoral approach as I did 3 years ago.  5 French right left second status and catheters were used for selective coronary angiography and obtain left heart pressures.  Isovue dye is used for the entirety of the case (50 cc of contrast total to patient).  Retrograde aortic, ventricular and pullback pressures were recorded. The patient received 8000's of heparin with an ACT of  305.  Using a 6 [place] XB LAD 3.0 cm guide catheter along with a Abbott DFR wire across the 60% stenosis beyond the stented segment demonstrating a DFR of 0.96 suggesting this was not physiologically significant.  Her approximately the stent was patent.  The ostium of her first diagonal branch had restenosed similar to 3 years ago.  I crossed the diagonal branch ostium through the stent struts with an 014 [place] guidewire and dilated the diagonal branch ostium with a 2 mm x 8 mm long balloon at 14 atm resulting in reduction of a 95% ostial first diagonal branch stenosis to 25% residual.  The patient tolerated the procedure well.  The guidewire and guide catheter were removed.  The sheath was secured.  His effort wristband was placed on the right wrist after the radial sheath was removed to achieve patent hemostasis.  The patient was already on aspirin and clopidogrel.   Ms. Lick had wide a widely patent proximal LAD stented segment, a 60% fairly focal mid LAD stenosis beyond the stented segment at that takeoff of a first large septal perforator.  This was negative by DFR (0.96).  Her first diagonal branch ostium had restenosed which was successfully redilated.  Her LVEDP was 3.  There is no other cardiovascular estimation for her symptoms.  These were  the same presenting symptoms that she had 3 years ago that resolved with diagonal branch ostial intervention.  The femoral sheath will be removed once ACT falls below 170 pressure held.  She will be gently hydrated, and discharged home in the morning on aspirin and clopidogrel.  She apparently needs a total knee replacement.  I prefer to wait 3 to 6 months given her freshly dilated diagonal branch prior to interrupting antiplatelet therapy. Quay Burow. MD, [facility] 01/20/2022 5:05 PM    ECHOCARDIOGRAM COMPLETE  Result Date: 01/19/2022    ECHOCARDIOGRAM REPORT   Patient Name:   [name] [name] Date of Exam: 01/19/2022 Medical Rec #:  [id number]        Height:        65.0 in Accession #:    [id number]       Weight:       172.4 lb Date of Birth:  03/14/1946        BSA:          1.857 m Patient Age:    86 years         BP:           136/66 mmHg Patient Gender: F                HR:           68 bpm. Exam Location:  Inpatient Procedure: 2D Echo, Cardiac Doppler, Color Doppler and Intracardiac            Opacification Agent Indications:    CHF-Acute Diastolic M27.07  History:        Patient has no prior history of Echocardiogram examinations. CAD                 and NSTEMI-PCI March 2018; Risk Factors:Hypertension,                 Dyslipidemia and Sleep Apnea.  Sonographer:    Darlina Sicilian RDCS Referring Phys: [id number] [place]  1. Left ventricular ejection fraction, by estimation, is 60 to 65%. The left ventricle has normal function. The left ventricle has no regional wall motion abnormalities. Left ventricular diastolic parameters were normal.  2. Right ventricular systolic function is normal. The right ventricular size is normal. There is normal pulmonary artery systolic pressure.  3. The mitral valve is normal in structure. No evidence of mitral valve regurgitation. No evidence of mitral stenosis.  4. The aortic valve is tricuspid. Aortic valve regurgitation is trivial. No aortic stenosis is present.  5. The inferior vena cava is normal in size with greater than 50% respiratory variability, suggesting right atrial pressure of 3 mmHg. FINDINGS  Left Ventricle: Left ventricular ejection fraction, by estimation, is 60 to 65%. The left ventricle has normal function. The left ventricle has no regional wall motion abnormalities. Definity contrast agent was given IV to delineate the left ventricular  endocardial borders. The left ventricular internal cavity size was normal in size. There is no left ventricular hypertrophy. Left ventricular diastolic parameters were normal. Right Ventricle: The right ventricular size is normal. No increase in right ventricular wall  thickness. Right ventricular systolic function is normal. There is normal pulmonary artery systolic pressure. The tricuspid regurgitant velocity is 2.33 m/s, and  with an assumed right atrial pressure of 3 mmHg, the estimated right ventricular systolic pressure is 20.1 mmHg. Left Atrium: Left atrial size was normal in size. Right Atrium: Right atrial size was normal in size. Pericardium: There is no  evidence of pericardial effusion. Presence of epicardial fat layer. Mitral Valve: The mitral valve is normal in structure. No evidence of mitral valve regurgitation. No evidence of mitral valve stenosis. Tricuspid Valve: The tricuspid valve is normal in structure. Tricuspid valve regurgitation is trivial. No evidence of tricuspid stenosis. Aortic Valve: The aortic valve is tricuspid. Aortic valve regurgitation is trivial. No aortic stenosis is present. Pulmonic Valve: The pulmonic valve was normal in structure. Pulmonic valve regurgitation is not visualized. No evidence of pulmonic stenosis. Aorta: The aortic root is normal in size and structure. Venous: The inferior vena cava is normal in size with greater than 50% respiratory variability, suggesting right atrial pressure of 3 mmHg. IAS/Shunts: No atrial level shunt detected by color flow Doppler.  LEFT VENTRICLE PLAX 2D LVIDd:         4.70 cm     Diastology LVIDs:         2.70 cm     LV e' medial:    7.51 cm/s LV PW:         0.80 cm     LV E/e' medial:  7.2 LV IVS:        0.90 cm     LV e' lateral:   5.44 cm/s LVOT diam:     1.90 cm     LV E/e' lateral: 9.9 LV SV:         59 LV SV Index:   32 LVOT Area:     2.84 cm  LV Volumes (MOD) LV vol d, MOD A2C: 71.2 ml LV vol d, MOD A4C: 69.9 ml LV vol s, MOD A2C: 22.5 ml LV vol s, MOD A4C: 26.2 ml LV SV MOD A2C:     48.7 ml LV SV MOD A4C:     69.9 ml LV SV MOD BP:      45.5 ml RIGHT VENTRICLE RV S prime:     13.50 cm/s TAPSE (M-mode): 2.2 cm LEFT ATRIUM             Index        RIGHT ATRIUM          Index LA diam:        3.90  cm 2.10 cm/m   RA Area:     9.79 cm LA Vol (A2C):   44.8 ml 24.12 ml/m  RA Volume:   18.40 ml 9.91 ml/m LA Vol (A4C):   42.0 ml 22.62 ml/m LA Biplane Vol: 44.0 ml 23.69 ml/m  AORTIC VALVE LVOT Vmax:   102.00 cm/s LVOT Vmean:  65.200 cm/s LVOT VTI:    0.208 m  AORTA Ao Root diam: 3.00 cm Ao Asc diam:  3.40 cm MITRAL VALVE               TRICUSPID VALVE MV Area (PHT): 3.12 cm    TR Peak grad:   21.7 mmHg MV Decel Time: 243 msec    TR Vmax:        233.00 cm/s MV E velocity: 54.00 cm/s MV A velocity: 70.30 cm/s  SHUNTS MV E/A ratio:  0.77        Systemic VTI:  0.21 m                            Systemic Diam: 1.90 cm Kardie Tobb DO Electronically signed by Berniece Salines DO Signature Date/Time: 01/19/2022/12:52:39 PM    Final    CT Angio Chest PE W and/or Wo Contrast  Result Date: 01/17/2022 CLINICAL DATA:  Suspected pulmonary embolism. EXAM: CT ANGIOGRAPHY CHEST WITH CONTRAST TECHNIQUE: Multidetector CT imaging of the chest was performed using the standard protocol during bolus administration of intravenous contrast. Multiplanar CT image reconstructions and MIPs were obtained to evaluate the vascular anatomy. RADIATION DOSE REDUCTION: This exam was performed according to the departmental dose-optimization program which includes automated exposure control, adjustment of the mA and/or kV according to patient size and/or use of iterative reconstruction technique. CONTRAST:  44m OMNIPAQUE IOHEXOL 350 MG/ML SOLN COMPARISON:  None Available. FINDINGS: Cardiovascular: There is mild calcification of the aortic arch, without evidence of aortic aneurysm or dissection. Satisfactory opacification of the pulmonary arteries to the segmental level. No evidence of pulmonary embolism. Normal heart size. A coronary artery stent is in place. No pericardial effusion. Mediastinum/Nodes: No enlarged mediastinal, hilar, or axillary lymph nodes. Thyroid gland, trachea, and esophagus demonstrate no significant findings. Lungs/Pleura: An 8  mm noncalcified pulmonary nodule is seen within the lateral aspect of the left lower lobe (axial CT image 66, CT series 6). Very mild atelectatic changes are seen along the posterior aspect of the bilateral lower lobes. There is no evidence of acute infiltrate, pleural effusion or pneumothorax. Upper Abdomen: No acute abnormality. Musculoskeletal: Extensive postoperative changes are seen within the visualized portion of the lumbar spine. Multilevel degenerative changes are noted throughout the thoracic spine and lower cervical spine. Review of the MIP images confirms the above findings. IMPRESSION: 1. No evidence of pulmonary embolism or other acute intrathoracic process. 2. 8 mm noncalcified left lower lobe pulmonary nodule. Non-contrast chest CT at 6-12 months is recommended. If the nodule is stable at time of repeat CT, then future CT at 18-24 months (from today's scan) is considered optional for low-risk patients, but is recommended for high-risk patients. This recommendation follows the consensus statement: Guidelines for Management of Incidental Pulmonary Nodules Detected on CT Images: From the Fleischner Society 2017; Radiology 2017; 284:228-243. Aortic Atherosclerosis (ICD10-I70.0). Electronically Signed   By: TVirgina NorfolkM.D.   On: 01/17/2022 19:42   UKoreaVenous Img Lower  Left (DVT Study)  Result Date: 01/17/2022 CLINICAL DATA:  Leg swelling. EXAM: LEFT LOWER EXTREMITY VENOUS DOPPLER ULTRASOUND TECHNIQUE: Gray-scale sonography with compression, as well as color and duplex ultrasound, were performed to evaluate the deep venous system(s) from the level of the common femoral vein through the popliteal and proximal calf veins. COMPARISON:  None Available. FINDINGS: VENOUS Normal compressibility of the common femoral, superficial femoral, and popliteal veins, as well as the visualized calf veins. Visualized portions of profunda femoral vein and great saphenous vein unremarkable. No filling defects to  suggest DVT on grayscale or color Doppler imaging. Doppler waveforms show normal direction of venous flow, normal respiratory plasticity and response to augmentation. Limited views of the contralateral common femoral vein are unremarkable. OTHER None. Limitations: none IMPRESSION: No evidence of left lower extremity DVT. Electronically Signed   By: MKeith RakeM.D.   On: 01/17/2022 17:35   DG Chest 2 View  Result Date: 01/17/2022 CLINICAL DATA:  Shortness of breath. EXAM: CHEST - 2 VIEW COMPARISON:  Chest radiographs 09/14/2016 FINDINGS: The cardiac silhouette is borderline enlarged. There is chronic coarsening of the interstitial markings. No overt pulmonary edema, airspace consolidation, pleural effusion, or pneumothorax is identified. Prior lumbar fusion is noted. IMPRESSION: No active cardiopulmonary disease. Electronically Signed   By: ALogan BoresM.D.   On: 01/17/2022 15:00    DISCHARGE EXAMINATION: Vitals:   01/20/22 2238 01/21/22 0021  01/21/22 0431 01/21/22 0818  BP: 116/68 105/69 (!) 106/48 (!) 116/58  Pulse: 68 70 60 71  Resp: _0 Temp:  97.8 F (36.6 C) (!) 97.5 F (36.4 C)   TempSrc:  Axillary Oral   SpO2: 96% 94% 96% 92%  Weight:   78 kg   Height:       General appearance: Awake alert.  In no distress Resp: Clear to auscultation bilaterally.  Normal effort Cardio: S1-S2 is normal regular.  No S3-S4.  No rubs murmurs or bruit GI: Abdomen is soft.  Nontender nondistended.  Bowel sounds are present normal.  No masses organomegaly Extremities: No edema.  Full range of motion of lower extremities. Neurologic: Alert and oriented x3.  No focal neurological deficits.    DISPOSITION: Home  Discharge Instructions     Amb Referral to Cardiac Rehabilitation   Complete by: As directed    Diagnosis: PTCA   After initial evaluation and assessments completed: Virtual Based Care may be provided alone or in conjunction with Phase 2 Cardiac Rehab based on patient barriers.:  Yes   Call MD for:  difficulty breathing, headache or visual disturbances   Complete by: As directed    Call MD for:  extreme fatigue   Complete by: As directed    Call MD for:  persistant dizziness or light-headedness   Complete by: As directed    Call MD for:  persistant nausea and vomiting   Complete by: As directed    Call MD for:  severe uncontrolled pain   Complete by: As directed    Call MD for:  temperature >100.4   Complete by: As directed    Diet - low sodium heart healthy   Complete by: As directed    Discharge instructions   Complete by: As directed    Please take your medications as prescribed.  Please be sure to keep your follow-up appointment.  You were cared for by a hospitalist during your hospital stay. If you have any questions about your discharge medications or the care you received while you were in the hospital after you are discharged, you can call the unit and asked to speak with the hospitalist on call if the hospitalist that took care of you is not available. Once you are discharged, your primary care physician will handle any further medical issues. Please note that NO REFILLS for any discharge medications will be authorized once you are discharged, as it is imperative that you return to your primary care physician (or establish a relationship with a primary care physician if you do not have one) for your aftercare needs so that they can reassess your need for medications and monitor your lab values. If you do not have a primary care physician, you can call [phone] for a physician referral.   Increase activity slowly   Complete by: As directed          Allergies as of 01/21/2022       Reactions   Codeine Anaphylaxis   Garlic Anaphylaxis   Morphine And Related Nausea And Vomiting   Atorvastatin Other (See Comments)   Muscle aches   Pravastatin Sodium Other (See Comments)   Muscle pain        Medication List     TAKE these medications     acetaminophen 500 MG tablet Commonly known as: TYLENOL Take 500-650 mg by mouth every 6 (six) hours as needed for headache or moderate pain.   Aimovig 70  MG/ML Soaj Generic drug: Erenumab-aooe Inject 70 mg into the skin every 30 (thirty) days.   albuterol 108 (90 Base) MCG/ACT inhaler Commonly known as: VENTOLIN HFA Inhale 2 puffs into the lungs 4 (four) times daily as needed for wheezing or shortness of breath.   amLODipine 5 MG tablet Commonly known as: NORVASC Take 1 tablet (5 mg total) by mouth daily.   aspirin EC 81 MG tablet Take 81 mg by mouth daily.   carvedilol 25 MG tablet Commonly known as: COREG Take 25 mg by mouth 2 (two) times daily with a meal. What changed: Another medication with the same name was removed. Continue taking this medication, and follow the directions you see here.   clopidogrel 75 MG tablet Commonly known as: PLAVIX TAKE 1 TABLET BY MOUTH DAILY.   fluticasone 50 MCG/ACT nasal spray Commonly known as: FLONASE Place 1-2 sprays into both nostrils daily as needed for allergies.   levocetirizine 5 MG tablet Commonly known as: XYZAL Take 5 mg by mouth every evening.   nitroGLYCERIN 0.4 MG SL tablet Commonly known as: NITROSTAT Place 1 tablet (0.4 mg total) under the tongue every 5 (five) minutes x 3 doses as needed. What changed: reasons to take this   nortriptyline 25 MG capsule Commonly known as: PAMELOR Take 25 mg by mouth 2 (two) times daily.   pantoprazole 40 MG tablet Commonly known as: PROTONIX Take 1 tablet (40 mg total) by mouth daily.   Repatha SureClick 299 MG/ML Soaj Generic drug: Evolocumab INJECT 140 MG INTO THE SKIN EVERY 14 DAYS. What changed: See the new instructions.   sertraline 100 MG tablet Commonly known as: ZOLOFT Take 100 mg by mouth daily.   SYSTANE COMPLETE OP Place 1 drop into both eyes daily as needed (dry eyes).   valsartan 320 MG tablet Commonly known as: DIOVAN Take 1 tablet (320 mg total) by  mouth daily.   Vitamin D (Ergocalciferol) 1.25 MG (50000 UNIT) Caps capsule Commonly known as: DRISDOL Take 50,000 Units by mouth once a week.          Follow-up Information     Lenna Sciara, NP Follow up on 02/01/2022.   Specialties: Nurse Practitioner, Family Medicine Why: on 02/01/22 at 10:05AM Contact information: [address] [place] [address] [phone]                 August: 71 minutes  Scott  [facility] Pager on [email or web address]  01/22/2022, 1:20 PM

## 2022-01-21 NOTE — Progress Notes (Addendum)
Progress Note  Patient Name: [name] [name] Date of Encounter: 01/21/2022  [facility] Cardiologist: Quay Burow, MD   Subjective   Breathing is always substantially better.  Was able to ambulate in hallway with greatly improved shortness of breath.  No edema.  Inpatient Medications    Scheduled Meds:  aspirin  81 mg Oral Daily   aspirin EC  81 mg Oral Daily   carvedilol  25 mg Oral BID WC   clopidogrel  75 mg Oral Daily   clopidogrel  75 mg Oral Q breakfast   diclofenac Sodium  2 g Topical QID   enoxaparin (LOVENOX) injection  40 mg Subcutaneous QHS   hydrochlorothiazide  12.5 mg Oral Daily   irbesartan  300 mg Oral QHS   loratadine  10 mg Oral Daily   nortriptyline  25 mg Oral BID   pantoprazole  40 mg Oral Daily   sertraline  100 mg Oral Daily   sodium chloride flush  3 mL Intravenous Q12H   sodium chloride flush  3 mL Intravenous Q12H   sodium chloride flush  3 mL Intravenous Q12H   Continuous Infusions:  sodium chloride     sodium chloride     PRN Meds: sodium chloride, sodium chloride, acetaminophen, fluticasone, ondansetron (ZOFRAN) IV, sodium chloride flush, sodium chloride flush   Vital Signs    Vitals:   01/20/22 2238 01/21/22 0021 01/21/22 0431 01/21/22 0818  BP: 116/68 105/69 (!) 106/48 (!) 116/58  Pulse: 68 70 60 71  Resp: $Remo'18 19 18 18  'Jfifl$ Temp:  97.8 F (36.6 C) (!) 97.5 F (36.4 C)   TempSrc:  Axillary Oral   SpO2: 96% 94% 96% 92%  Weight:   78 kg   Height:        Intake/Output Summary (Last 24 hours) at 01/21/2022 0940 Last data filed at 01/21/2022 0900 Gross per 24 hour  Intake 1369.98 ml  Output 350 ml  Net 1019.98 ml      01/21/2022    4:31 AM 01/20/2022    4:48 AM 01/19/2022    4:10 PM  Last 3 Weights  Weight (lbs) 171 lb 14.4 oz 172 lb 171 lb 1.2 oz  Weight (kg) 77.973 kg 78.019 kg 77.6 kg      Telemetry    Normal sinus rhythm- Personally Reviewed  ECG    Normal sinus rhythm with borderline criteria for LVH, no significant  ischemic repolarization abnormalities.- Personally Reviewed  Physical Exam  Appears very comfortable lying fully supine in bed.  Normal pulse and no hematoma at the right radial aborted access site, no ecchymosis or hematoma in the right groin GEN: No acute distress.   Neck: No JVD Cardiac: RRR, no murmurs, rubs, or gallops.  Respiratory: Clear to auscultation bilaterally. GI: Soft, nontender, non-distended  MS: No edema; No deformity. Neuro:  Nonfocal  Psych: Normal affect   Labs    High Sensitivity Troponin:   Recent Labs  Lab 01/17/22 1434 01/17/22 1645  TROPONINIHS 4 4     Chemistry Recent Labs  Lab 01/17/22 1434 01/19/22 0353 01/20/22 0401 01/21/22 0253  NA 139 142 138 138  K 4.1 4.3 3.9 3.5  CL 107 106 105 108  CO2 $Re'25 29 26 'yDM$ 19*  GLUCOSE 101* 104* 105* 90  BUN $Re'10 16 21 18  'dxw$ CREATININE 0.71 0.93 0.88 0.73  CALCIUM 9.7 10.4* 9.9 9.4  PROT 6.7  --   --   --   ALBUMIN 3.5  --   --   --  AST 19  --   --   --   ALT 15  --   --   --   ALKPHOS 64  --   --   --   BILITOT 0.5  --   --   --   GFRNONAA >60 >60 >60 >60  ANIONGAP $RemoveB'7 7 7 11    'tDhLNNem$ Lipids No results for input(s): "CHOL", "TRIG", "HDL", "LABVLDL", "LDLCALC", "CHOLHDL" in the last 168 hours.  Hematology Recent Labs  Lab 01/17/22 1434 01/21/22 0253  WBC 5.3 8.5  RBC 4.49 4.37  HGB 12.9 12.5  HCT 38.5 37.6  MCV 85.7 86.0  MCH 28.7 28.6  MCHC 33.5 33.2  RDW 13.6 13.5  PLT 315 264   Thyroid No results for input(s): "TSH", "FREET4" in the last 168 hours.  BNP Recent Labs  Lab 01/17/22 1434  BNP 115.8*    DDimer No results for input(s): "DDIMER" in the last 168 hours.   Radiology    CARDIAC CATHETERIZATION  Result Date: 01/20/2022   Mid LAD lesion is 60% stenosed.   1st Diag lesion is 95% stenosed.   Non-stenotic Prox LAD to Mid LAD lesion was previously treated.   Balloon angioplasty was performed.   Post intervention, there is a 25% residual stenosis. [name] [name] is a 76 y.o. female  [id number]  LOCATION:  FACILITY: [facility] PHYSICIAN: Quay Burow, M.D. 17-Sep-1945 DATE OF PROCEDURE:  01/20/2022 DATE OF DISCHARGE: CARDIAC CATHETERIZATION / POBA D1 History obtained from chart review.76 y.o. female with a hx of CAD, hypertension, hyperlipidemia, and obstructive sleep apnea on CPAP therapy, was admitted 07/04 with SOB,.  She had LAD restenting by myself in 2019 and first diagonal branch ostial PTCA in 2020.  She was admitted with similar symptoms as prior to her diagonal branch intervention 3 years ago.  Her 2D echo was normal with no evidence of diastolic dysfunction.  She presents now for diagnostic coronary angiography. PROCEDURE DESCRIPTION: The patient was brought to the second floor [facility] cath lab in the postabsorptive state.  She was not premedicated.  Her right groin was prepped and shaved in usual sterile fashion. Xylocaine 1% was used for local anesthesia. A 5 French sheath was inserted into the right common femoral artery using standard Seldinger technique.  I did access the right radial artery under ultrasound guidance and demonstrated good blood return through the SideArm sheath however when I passed the catheter there was excessive spasm and therefore I defaulted to the femoral approach as I did 3 years ago.  5 French right left second status and catheters were used for selective coronary angiography and obtain left heart pressures.  Isovue dye is used for the entirety of the case (50 cc of contrast total to patient).  Retrograde aortic, ventricular and pullback pressures were recorded. The patient received 8000's of heparin with an ACT of 305.  Using a 6 [place] XB LAD 3.0 cm guide catheter along with a Abbott DFR wire across the 60% stenosis beyond the stented segment demonstrating a DFR of 0.96 suggesting this was not physiologically significant.  Her approximately the stent was patent.  The ostium of her first diagonal branch had restenosed similar to 3 years ago.  I crossed the  diagonal branch ostium through the stent struts with an 014 [place] guidewire and dilated the diagonal branch ostium with a 2 mm x 8 mm long balloon at 14 atm resulting in reduction of a 95% ostial first diagonal branch stenosis to 25% residual.  The patient tolerated the procedure well.  The guidewire and guide catheter were removed.  The sheath was secured.  His effort wristband was placed on the right wrist after the radial sheath was removed to achieve patent hemostasis.  The patient was already on aspirin and clopidogrel.   Ms. Krogh had wide a widely patent proximal LAD stented segment, a 60% fairly focal mid LAD stenosis beyond the stented segment at that takeoff of a first large septal perforator.  This was negative by DFR (0.96).  Her first diagonal branch ostium had restenosed which was successfully redilated.  Her LVEDP was 3.  There is no other cardiovascular estimation for her symptoms.  These were the same presenting symptoms that she had 3 years ago that resolved with diagonal branch ostial intervention.  The femoral sheath will be removed once ACT falls below 170 pressure held.  She will be gently hydrated, and discharged home in the morning on aspirin and clopidogrel.  She apparently needs a total knee replacement.  I prefer to wait 3 to 6 months given her freshly dilated diagonal branch prior to interrupting antiplatelet therapy. Quay Burow. MD, [facility] 01/20/2022 5:05 PM    ECHOCARDIOGRAM COMPLETE  Result Date: 01/19/2022    ECHOCARDIOGRAM REPORT   Patient Name:   [name] [name] Date of Exam: 01/19/2022 Medical Rec #:  [id number]        Height:       65.0 in Accession #:    [id number]       Weight:       172.4 lb Date of Birth:  January 19, 1946        BSA:          1.857 m Patient Age:    23 years         BP:           136/66 mmHg Patient Gender: F                HR:           68 bpm. Exam Location:  Inpatient Procedure: 2D Echo, Cardiac Doppler, Color Doppler and Intracardiac             Opacification Agent Indications:    CHF-Acute Diastolic A30.94  History:        Patient has no prior history of Echocardiogram examinations. CAD                 and NSTEMI-PCI March 2018; Risk Factors:Hypertension,                 Dyslipidemia and Sleep Apnea.  Sonographer:    Darlina Sicilian RDCS Referring Phys: [id number] [place]  1. Left ventricular ejection fraction, by estimation, is 60 to 65%. The left ventricle has normal function. The left ventricle has no regional wall motion abnormalities. Left ventricular diastolic parameters were normal.  2. Right ventricular systolic function is normal. The right ventricular size is normal. There is normal pulmonary artery systolic pressure.  3. The mitral valve is normal in structure. No evidence of mitral valve regurgitation. No evidence of mitral stenosis.  4. The aortic valve is tricuspid. Aortic valve regurgitation is trivial. No aortic stenosis is present.  5. The inferior vena cava is normal in size with greater than 50% respiratory variability, suggesting right atrial pressure of 3 mmHg. FINDINGS  Left Ventricle: Left ventricular ejection fraction, by estimation, is 60 to 65%. The left ventricle has normal function. The left ventricle has no  regional wall motion abnormalities. Definity contrast agent was given IV to delineate the left ventricular  endocardial borders. The left ventricular internal cavity size was normal in size. There is no left ventricular hypertrophy. Left ventricular diastolic parameters were normal. Right Ventricle: The right ventricular size is normal. No increase in right ventricular wall thickness. Right ventricular systolic function is normal. There is normal pulmonary artery systolic pressure. The tricuspid regurgitant velocity is 2.33 m/s, and  with an assumed right atrial pressure of 3 mmHg, the estimated right ventricular systolic pressure is 57.3 mmHg. Left Atrium: Left atrial size was normal in size. Right Atrium:  Right atrial size was normal in size. Pericardium: There is no evidence of pericardial effusion. Presence of epicardial fat layer. Mitral Valve: The mitral valve is normal in structure. No evidence of mitral valve regurgitation. No evidence of mitral valve stenosis. Tricuspid Valve: The tricuspid valve is normal in structure. Tricuspid valve regurgitation is trivial. No evidence of tricuspid stenosis. Aortic Valve: The aortic valve is tricuspid. Aortic valve regurgitation is trivial. No aortic stenosis is present. Pulmonic Valve: The pulmonic valve was normal in structure. Pulmonic valve regurgitation is not visualized. No evidence of pulmonic stenosis. Aorta: The aortic root is normal in size and structure. Venous: The inferior vena cava is normal in size with greater than 50% respiratory variability, suggesting right atrial pressure of 3 mmHg. IAS/Shunts: No atrial level shunt detected by color flow Doppler.  LEFT VENTRICLE PLAX 2D LVIDd:         4.70 cm     Diastology LVIDs:         2.70 cm     LV e' medial:    7.51 cm/s LV PW:         0.80 cm     LV E/e' medial:  7.2 LV IVS:        0.90 cm     LV e' lateral:   5.44 cm/s LVOT diam:     1.90 cm     LV E/e' lateral: 9.9 LV SV:         59 LV SV Index:   32 LVOT Area:     2.84 cm  LV Volumes (MOD) LV vol d, MOD A2C: 71.2 ml LV vol d, MOD A4C: 69.9 ml LV vol s, MOD A2C: 22.5 ml LV vol s, MOD A4C: 26.2 ml LV SV MOD A2C:     48.7 ml LV SV MOD A4C:     69.9 ml LV SV MOD BP:      45.5 ml RIGHT VENTRICLE RV S prime:     13.50 cm/s TAPSE (M-mode): 2.2 cm LEFT ATRIUM             Index        RIGHT ATRIUM          Index LA diam:        3.90 cm 2.10 cm/m   RA Area:     9.79 cm LA Vol (A2C):   44.8 ml 24.12 ml/m  RA Volume:   18.40 ml 9.91 ml/m LA Vol (A4C):   42.0 ml 22.62 ml/m LA Biplane Vol: 44.0 ml 23.69 ml/m  AORTIC VALVE LVOT Vmax:   102.00 cm/s LVOT Vmean:  65.200 cm/s LVOT VTI:    0.208 m  AORTA Ao Root diam: 3.00 cm Ao Asc diam:  3.40 cm MITRAL VALVE                TRICUSPID VALVE MV Area (PHT): 3.12  cm    TR Peak grad:   21.7 mmHg MV Decel Time: 243 msec    TR Vmax:        233.00 cm/s MV E velocity: 54.00 cm/s MV A velocity: 70.30 cm/s  SHUNTS MV E/A ratio:  0.77        Systemic VTI:  0.21 m                            Systemic Diam: 1.90 cm Kardie Tobb DO Electronically signed by Berniece Salines DO Signature Date/Time: 01/19/2022/12:52:39 PM    Final     Cardiac Studies   Angiography results and echo reviewed  Patient Profile     76 y.o. female with a history of coronary artery disease, hypertension hyperlipidemia obstructive sleep apnea on CPAP, with recurrent presentations with shortness of breath in the setting of recurrent restenosis of the "jailed" first diagonal branch of the stented LAD artery  Assessment & Plan    No cath access site complications. Symptoms markedly improved. Discussed mandatory dual antiplatelet therapy. It does not look like she will require maintenance loop diuretics. Preserved left ventricular systolic function.  If symptoms of CHF recurred, consider SGLT2 inhibitor.  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 Important to start treatment with PCSK9 inhibitor as outpatient. Appears ready for discharge. [facility] will sign off.   Medication Recommendations: Aspirin 81 mg daily, clopidogrel 75 mg daily, carvedilol 25 mg twice daily, valsartan 320 mg once daily, amlodipine 5 mg daily Other recommendations (labs, testing, etc):   Follow up as an outpatient: 10/22/2016 2023 with Diona Browner, NP.  We will start Repatha or Praluent at that time. Ordered a lipid panel this morning since we need an updated lab to order PCSK9 inhibitors.  For questions or updates, please contact [place] Please consult [email or web address] for contact info under        Signed, Sanda Klein, MD  01/21/2022, 9:40 AM

## 2022-01-22 LAB — LIPOPROTEIN A (LPA): Lipoprotein (a): 26.5 nmol/L (ref <75.0)

## 2022-01-24 ENCOUNTER — Telehealth: Payer: Self-pay | Admitting: Cardiovascular Disease

## 2022-01-24 NOTE — Telephone Encounter (Signed)
Left message for Tonya to call back. 

## 2022-01-24 NOTE — Telephone Encounter (Signed)
Tonya from [facility] is requesting orders be sent for home health and is requesting a call back to discuss times.

## 2022-01-25 NOTE — Telephone Encounter (Signed)
Tonya from [facility] is returning call and requesting a call back. She is asking for you to leave a detailed message of the orders if she doesn't answer.   Nursing 1 week 3 1 every 2 week 6 for CHF teaching

## 2022-01-25 NOTE — Telephone Encounter (Signed)
Spoke to [employer] order given.

## 2022-01-27 NOTE — Telephone Encounter (Signed)
Patient contacted regarding discharge from [facility] hospital on 01/21/22.  Patient understands to follow up with Edd Fabian on 02/10/22 at 1:30 pm at [facility] location.. Patient understands discharge instructions. Patient understands medications and regiment. Patient understands to bring all medications to this visit.

## 2022-01-31 ENCOUNTER — Encounter: Payer: Self-pay | Admitting: Cardiovascular Disease

## 2022-01-31 ENCOUNTER — Telehealth (HOSPITAL_COMMUNITY): Payer: Self-pay

## 2022-01-31 NOTE — Telephone Encounter (Signed)
Called patient to see if she is interested in the Cardiac Rehab Program. Patient expressed interest. Explained scheduling process and went over insurance, patient verbalized understanding. Will contact patient for scheduling once f/u has been completed. 

## 2022-01-31 NOTE — Telephone Encounter (Signed)
Pt insurance is active and benefits verified through [employer] Medicare Co-pay 0, DED 0/0 met, out of pocket $3,600/$308.88 met, co-insurance 0%. no pre-authorization required. [employer], 01/31/2022@4:07pm, REF# [id number]   How many CR sessions are covered? (36 sessions for TCR, 72 sessions for ICR)72 Is this a lifetime maximum or an annual maximum? lifetime Has the member used any of these services to date? no Is there a time limit (weeks/months) on start of program and/or program completion? no     Will contact patient to see if she is interested in the Cardiac Rehab Program. If interested, patient will need to complete follow up appt. Once completed, patient will be contacted for scheduling upon review by the RN Navigator. 

## 2022-02-01 ENCOUNTER — Telehealth: Payer: Self-pay

## 2022-02-01 ENCOUNTER — Ambulatory Visit: Payer: Medicare Other | Admitting: Nurse Practitioner

## 2022-02-01 NOTE — Telephone Encounter (Signed)
   Pre-operative Risk Assessment    Patient Name: [name] [name]  DOB: 18-Jun-1946 MRN: [id number]      Request for Surgical Clearance    Procedure:   Left Total Knee Arthroplasty  Date of Surgery:  Clearance 03/01/22                                 Surgeon:  Dr. Elbert Ewings Surgeon's Group or Practice Name:  [facility] [place] Phone number:  [phone] Fax number:  [phone]   Type of Clearance Requested:   - Medical    Type of Anesthesia:  Not Indicated   Additional requests/questions:  Please advise surgeon/provider what medications should be held.  Signed, Irena Cords Brissia Delisa   02/01/2022, 3:43 PM

## 2022-02-02 MED ORDER — FUROSEMIDE 40 MG PO TABS: 40.0000 mg | ORAL_TABLET | Freq: Every day | ORAL | 1 refills | Status: DC

## 2022-02-02 MED ORDER — POTASSIUM CHLORIDE CRYS ER 20 MEQ PO TBCR: 20.0000 meq | EXTENDED_RELEASE_TABLET | Freq: Every day | ORAL | 1 refills | Status: DC

## 2022-02-02 NOTE — Telephone Encounter (Signed)
   Patient Name: [name] [name]  DOB: 06-04-1946 MRN: [id number]  Primary Cardiologist: [name], MD  Chart reviewed as part of pre-operative protocol coverage.  The patient has an upcoming visit scheduled with Edd Fabian, FNP on 02/10/2022 at which time clearance can be addressed in case there are any issues that would impact surgical recommendations.  Left total knee arthroplasty scheduled for 03/01/2022 as below.  I added pre-op FYI to appointment notes so that provider is aware to address at time of outpatient visit.  Per office protocol the cardiology provider should forward their finalize clearance decision and recommendations regarding antiplatelet therapy to the requesting party below.  I will route this message as FYI to requesting party and then remove this message from the pre-op box as separate pre-op APP input not needed at this time.  Please call with any questions.   Joylene Grapes, NP 02/02/2022, 5:24 PM

## 2022-02-02 NOTE — Addendum Note (Signed)
Addended by: Bernita Buffy on: 02/02/2022 04:13 PM   Modules accepted: Orders

## 2022-02-08 NOTE — Progress Notes (Unsigned)
Cardiology Clinic Note   Patient Name: [name] [name] Date of Encounter: 02/10/2022  Primary Care Provider:  Tracey Harries, MD Primary Cardiologist:  [name], MD  Patient Profile    [name] [name] 76 year old female presents the clinic today for follow-up evaluation of her acute on chronic diastolic CHF.  Past Medical History    Past Medical History:  Diagnosis Date   Arthritis    "knees, hips, hands, back" (09/15/2016)   Constipation    Coronary artery disease    NSTEMI-PCI March 2018   Exercise-induced asthma    Family history of adverse reaction to anesthesia    "daughter gets PONV too"   GERD (gastroesophageal reflux disease)    Heart murmur dx'd 09/14/2016   High cholesterol    Hypertension    Migraine    "recently had series of injections; none since; they had been weekly 1 month ago" (09/15/2016)   Necrotizing fasciitis (HCC)    R index finger- had a amp   NSTEMI (non-ST elevated myocardial infarction) (HCC)    Pneumonia ~ 2003   PONV (postoperative nausea and vomiting)    Patch helps   RSD (reflex sympathetic dystrophy)    Right arm after surgery   Past Surgical History:  Procedure Laterality Date   ANTERIOR LATERAL LUMBAR FUSION 4 LEVELS Right 09/20/2012   Procedure: ANTERIOR LATERAL LUMBAR FUSION 4 LEVELS;  Surgeon: Maeola Harman, MD;  Location: [facility];  Service: Neurosurgery;  Laterality: Right;  Right L1-2 L2-3 L3-4 L4-5 Anterolateral decompression/fusion/with L5-S1 Posterior lumbar interbody fusion/Percutaneous pedicle screws L1-S1   BACK SURGERY     CATARACT EXTRACTION W/ INTRAOCULAR LENS  IMPLANT, BILATERAL Bilateral 2017   CORONARY ANGIOPLASTY  05/14/2018   CORONARY ANGIOPLASTY WITH STENT PLACEMENT     CORONARY BALLOON ANGIOPLASTY N/A 05/14/2018   Procedure: CORONARY BALLOON ANGIOPLASTY;  Surgeon: Runell Gess, MD;  Location: [facility];  Service: Cardiovascular;  Laterality: N/A;   CORONARY BALLOON ANGIOPLASTY N/A 04/25/2019    Procedure: CORONARY BALLOON ANGIOPLASTY;  Surgeon: Runell Gess, MD;  Location: [facility];  Service: Cardiovascular;  Laterality: N/A;  diagonal 1st   CORONARY BALLOON ANGIOPLASTY N/A 01/20/2022   Procedure: CORONARY BALLOON ANGIOPLASTY;  Surgeon: Runell Gess, MD;  Location: [facility];  Service: Cardiovascular;  Laterality: N/A;   CORONARY STENT INTERVENTION N/A 09/15/2016   Procedure: Coronary Stent Intervention;  Surgeon: Rinaldo Cloud, MD;  Location: [facility];  Service: Cardiovascular;  Laterality: N/A;   CORONARY STENT INTERVENTION N/A 05/14/2018   Procedure: CORONARY STENT INTERVENTION;  Surgeon: Runell Gess, MD;  Location: [facility];  Service: Cardiovascular;  Laterality: N/A;   FINGER AMPUTATION Right 2008   Index finger   INTRAVASCULAR PRESSURE WIRE/FFR STUDY N/A 09/15/2016   Procedure: Intravascular Pressure Wire/FFR Study;  Surgeon: Rinaldo Cloud, MD;  Location[facility];  Service: Cardiovascular;  Laterality: N/A;   LEFT HEART CATH AND CORONARY ANGIOGRAPHY N/A 09/15/2016   Procedure: Left Heart Cath and Coronary Angiography;  Surgeon: Rinaldo Cloud, MD;  Location: [facility] CV [facility];  Service: Cardiovascular;  Laterality: N/A;   LEFT HEART CATH AND CORONARY ANGIOGRAPHY N/A 05/14/2018   Procedure: LEFT HEART CATH AND CORONARY ANGIOGRAPHY;  Surgeon: Runell Gess, MD;  Location: [facility];  Service: Cardiovascular;  Laterality: N/A;   LEFT HEART CATH AND CORONARY ANGIOGRAPHY N/A 04/25/2019   Procedure: LEFT HEART CATH AND CORONARY ANGIOGRAPHY;  Surgeon: [name]  J, MD;  Location: [facility];  Service: Cardiovascular;  Laterality: N/A;   LEFT HEART CATH AND CORONARY ANGIOGRAPHY N/A 01/20/2022   Procedure: LEFT HEART CATH AND CORONARY ANGIOGRAPHY;  Surgeon: Runell Gess, MD;  Location: [facility];  Service: Cardiovascular;  Laterality: N/A;   RIGHT/LEFT HEART CATH AND CORONARY ANGIOGRAPHY N/A  01/20/2022   Procedure: RIGHT/LEFT HEART CATH AND CORONARY ANGIOGRAPHY;  Surgeon: Runell Gess, MD;  Location: [facility];  Service: Cardiovascular;  Laterality: N/A;   TONSILLECTOMY      Allergies  Allergies  Allergen Reactions   Codeine Anaphylaxis   Garlic Anaphylaxis   Morphine And Related Nausea And Vomiting   Atorvastatin Other (See Comments)    Muscle aches   Pravastatin Sodium Other (See Comments)    Muscle pain    History of Present Illness    [name] [name] is a PMH of coronary artery disease, HTN, HLD, and acute on chronic diastolic CHF.  She presented to the emergency department on 01/18/2022 and was discharged on 01/21/2022.  She complained of increased shortness of breath.  She was diagnosed with acute on chronic diastolic CHF.  It was felt that she was having anginal equivalent type chest discomfort.  She was admitted.  Cardiology was consulted.  Her echocardiogram showed normal LV function.  She underwent cardiac catheterization which showed 95% stenosed first diagonal, and mid LAD 60% stenosis.  She underwent PCTA and was noted to have 25% residual stenosis post dilation.  She was placed on clopidogrel and aspirin.  She presents to the clinic today for follow-up evaluation states that she initially had an improvement in her breathing post cardiac catheterization and angioplasty.  At this time she reports that her work of breathing is increasing.  She notices that she is able to do physical activity however her endurance is reduced and she notices increased fatigue.  She also reports that her valsartan was discontinued while in the hospital due to low blood pressure.  She continues to notice low blood pressure at home.  We reviewed her echocardiogram and cardiac catheterization as well as her labs.  I will decrease her carvedilol to 12.5 mg during the day and continue her 25 mg in the evening.  She reports that she is doing physical therapy exercises 7 days/week.   Asked her to take a rest day through the week and slowly build her physical activity.  She has been taking her furosemide as needed.  We will plan follow-up in 2-3 months.  Today she denies chest pain,  lower extremity edema,  palpitations, melena, hematuria, hemoptysis, diaphoresis, weakness, presyncope, syncope, orthopnea, and PND.   Home Medications    Prior to Admission medications   Medication Sig Start Date End Date Taking? Authorizing Provider  acetaminophen (TYLENOL) 500 MG tablet Take 500-650 mg by mouth every 6 (six) hours as needed for headache or moderate pain.    [provider]  AIMOVIG 70 MG/ML SOAJ Inject 70 mg into the skin every 30 (thirty) days. 10/15/18   [provider]  albuterol (PROVENTIL HFA;VENTOLIN HFA) 108 (90 Base) MCG/ACT inhaler Inhale 2 puffs into the lungs 4 (four) times daily as needed for wheezing or shortness of breath. 07/26/13   [provider]  amLODipine (NORVASC) 5 MG tablet Take 1 tablet (5 mg total) by mouth daily. 01/21/22   Osvaldo Shipper, MD  aspirin EC 81 MG tablet Take 81 mg by mouth daily.    [provider]  carvedilol (COREG) 25 MG tablet Take 25 mg by mouth 2 (two) times daily with a meal.    [provider]  clopidogrel (PLAVIX) 75 MG tablet TAKE 1 TABLET BY MOUTH DAILY. Patient taking differently: Take 75 mg by mouth daily. 08/13/21   Runell Gess, MD  fluticasone (FLONASE) 50 MCG/ACT nasal spray Place 1-2 sprays into both nostrils daily as needed for allergies.    [provider]  furosemide (LASIX) 40 MG tablet Take 1 tablet (40 mg total) by mouth daily. 02/02/22   O'Neal, Ronnald Ramp, MD  levocetirizine (XYZAL) 5 MG tablet Take 5 mg by mouth every evening.    [provider]  nitroGLYCERIN (NITROSTAT) 0.4 MG SL tablet Place 1 tablet (0.4 mg total) under the tongue every 5 (five) minutes x 3 doses as needed. Patient taking differently: Place 0.4 mg under the tongue every 5  (five) minutes x 3 doses as needed for chest pain. 02/22/18   Runell Gess, MD  nortriptyline (PAMELOR) 25 MG capsule Take 25 mg by mouth 2 (two) times daily.    [provider]  pantoprazole (PROTONIX) 40 MG tablet Take 1 tablet (40 mg total) by mouth daily. 05/15/18   Arty Baumgartner, NP  potassium chloride SA (KLOR-CON M) 20 MEQ tablet Take 1 tablet (20 mEq total) by mouth daily. 02/02/22   O'Neal, Ronnald Ramp, MD  Propylene Glycol (SYSTANE COMPLETE OP) Place 1 drop into both eyes daily as needed (dry eyes).    [provider]  REPATHA SURECLICK 140 MG/ML SOAJ INJECT 140 MG INTO THE SKIN EVERY 14 DAYS. Patient taking differently: Inject 140 mg into the skin every 14 (fourteen) days. 10/21/21   Runell Gess, MD  sertraline (ZOLOFT) 100 MG tablet Take 100 mg by mouth daily. 10/19/21   [provider]  valsartan (DIOVAN) 320 MG tablet Take 1 tablet (320 mg total) by mouth daily. 08/18/21   Runell Gess, MD  Vitamin D, Ergocalciferol, (DRISDOL) 1.25 MG (50000 UNIT) CAPS capsule Take 50,000 Units by mouth once a week. 07/07/21   [provider]    Family History    Family History  Problem Relation Age of Onset   Heart failure Mother    Cancer Mother 70       Breast   Kidney failure Father    Brain cancer Sister    She indicated that her mother is deceased. She indicated that her father is deceased. She indicated that the status of her sister is unknown. She indicated that her maternal grandmother is deceased. She indicated that her maternal grandfather is deceased. She indicated that her paternal grandmother is deceased. She indicated that her paternal grandfather is deceased.  Social History    Social History   Socioeconomic History   Marital status: Widowed    Spouse name: Not on file   Number of children: Not on file   Years of education: Not on file   Highest education level: Not on file  Occupational History   Not on file  Tobacco  Use   Smoking status: Never   Smokeless tobacco: Never  Vaping Use   Vaping Use: Never used  Substance and Sexual Activity   Alcohol use: No   Drug use: No   Sexual activity: Not on file  Other Topics Concern   Not on file  Social History Narrative   Not on file   Social Determinants of Health   Financial Resource Strain: Not on  file  Food Insecurity: Not on file  Transportation Needs: Not on file  Physical Activity: Not on file  Stress: Not on file  Social Connections: Not on file  Intimate Partner Violence: Not on file     Review of Systems    General:  No chills, fever, night sweats or weight changes.  Cardiovascular:  No chest pain, dyspnea on exertion, edema, orthopnea, palpitations, paroxysmal nocturnal dyspnea. Dermatological: No rash, lesions/masses Respiratory: No cough, dyspnea Urologic: No hematuria, dysuria Abdominal:   No nausea, vomiting, diarrhea, bright red blood per rectum, melena, or hematemesis Neurologic:  No visual changes, wkns, changes in mental status. All other systems reviewed and are otherwise negative except as noted above.  Physical Exam    VS:  BP 112/60 (BP Location: Left Arm, Patient Position: Sitting, Cuff Size: Normal)   Pulse (!) 55   Ht 5\' 5"  (1.651 m)   Wt 173 lb 6.4 oz (78.7 kg)   BMI 28.86 kg/m  , BMI Body mass index is 28.86 kg/m. GEN: Well nourished, well developed, in no acute distress. HEENT: normal. Neck: Supple, no JVD, carotid bruits, or masses. Cardiac: RRR, no murmurs, rubs, or gallops. No clubbing, cyanosis, edema.  Radials/DP/PT 2+ and equal bilaterally.  Respiratory:  Respirations regular and unlabored, clear to auscultation bilaterally. GI: Soft, nontender, nondistended, BS + x 4. MS: no deformity or atrophy. Skin: warm and dry, no rash. Neuro:  Strength and sensation are intact. Psych: Normal affect.  Accessory Clinical Findings    Recent Labs: 01/17/2022: ALT 15; B Natriuretic Peptide 115.8 01/21/2022: BUN  18; Creatinine, Ser 0.73; Hemoglobin 12.5; Platelets 264; Potassium 3.5; Sodium 138   Recent Lipid Panel    Component Value Date/Time   CHOL 133 01/21/2022 0253   CHOL 117 03/06/2020 0828   TRIG 185 (H) 01/21/2022 0253   HDL 42 01/21/2022 0253   HDL 54 03/06/2020 0828   CHOLHDL 3.2 01/21/2022 0253   VLDL 37 01/21/2022 0253   LDLCALC 54 01/21/2022 0253   LDLCALC 41 03/06/2020 0828    ECG personally reviewed by me today-sinus bradycardia 55 bpm- No acute changes  Echocardiogram 01/19/2022  IMPRESSIONS     1. Left ventricular ejection fraction, by estimation, is 60 to 65%. The  left ventricle has normal function. The left ventricle has no regional  wall motion abnormalities. Left ventricular diastolic parameters were  normal.   2. Right ventricular systolic function is normal. The right ventricular  size is normal. There is normal pulmonary artery systolic pressure.   3. The mitral valve is normal in structure. No evidence of mitral valve  regurgitation. No evidence of mitral stenosis.   4. The aortic valve is tricuspid. Aortic valve regurgitation is trivial.  No aortic stenosis is present.   5. The inferior vena cava is normal in size with greater than 50%  respiratory variability, suggesting right atrial pressure of 3 mmHg.  Cardiac catheterization 01/20/2022    Mid LAD lesion is 60% stenosed.   1st Diag lesion is 95% stenosed.   Non-stenotic Prox LAD to Mid LAD lesion was previously treated.   Balloon angioplasty was performed.   Post intervention, there is a 25% residual stenosis.   [name] [name] is a 76 y.o. female      [id number] LOCATION:  FACILITY: [facility]  PHYSICIAN: Nanetta BattyJonathan Berry, M.D. 01-11-1946     DATE OF PROCEDURE:  01/20/2022  Diagnostic Dominance: Left  Intervention    Assessment & Plan   1.  Coronary artery disease-denies  recent episodes of chest discomfort.  Underwent cardiac catheterization with balloon angioplasty on 01/20/2022.  Details  above. Continue aspirin, clopidogrel,  amlodipine nitroglycerin Reduce carvedilol to 12.5 mg in a.m. and continue 25 mg in p.m. Heart healthy low-sodium diet Increase physical activity as tolerated  Chronic diastolic CHF-euvolemic today.  Weight today 173.4 pounds.  Denies increased DOE or activity intolerance.  Echocardiogram showed LVEF of 60-65% with normal diastolic parameters Continue losartan Heart healthy low-sodium diet Increase physical activity as tolerated  Hyperlipidemia-LDL 54 01/21/22 Continue aspirin Repatha Heart healthy low-sodium high-fiber diet Increase physical activity as tolerated  Essential hypertension-BP today 112/60 Continue losartan, carvedilol, amlodipine Heart healthy low-sodium diet-salty 6 given Increase physical activity as tolerated  Disposition: Follow-up with Dr. Allyson Sabal in 2-3 months.   Thomasene Ripple. Keaden Gunnoe NP-C     02/10/2022, 2:35 PM [name] [address] Office [phone] Fax [phone]  Notice: This dictation was prepared with Dragon dictation along with smaller phrase technology. Any transcriptional errors that result from this process are unintentional and may not be corrected upon review.  I spent 15 minutes examining this patient, reviewing medications, and using patient centered shared decision making involving her cardiac care.  Prior to her visit I spent greater than 20 minutes reviewing her past medical history,  medications, and prior cardiac tests.

## 2022-02-10 ENCOUNTER — Ambulatory Visit: Payer: Medicare Other | Admitting: General Practice

## 2022-02-10 ENCOUNTER — Encounter: Payer: Self-pay | Admitting: General Practice

## 2022-02-10 VITALS — BP 112/60 | HR 55 | Ht 65.0 in | Wt 173.4 lb

## 2022-02-10 DIAGNOSIS — I10 Essential (primary) hypertension: Secondary | ICD-10-CM | POA: Diagnosis not present

## 2022-02-10 DIAGNOSIS — I2510 Atherosclerotic heart disease of native coronary artery without angina pectoris: Secondary | ICD-10-CM | POA: Diagnosis not present

## 2022-02-10 DIAGNOSIS — E782 Mixed hyperlipidemia: Secondary | ICD-10-CM | POA: Diagnosis not present

## 2022-02-10 DIAGNOSIS — I5032 Chronic diastolic (congestive) heart failure: Secondary | ICD-10-CM | POA: Diagnosis not present

## 2022-02-10 DIAGNOSIS — Z9861 Coronary angioplasty status: Secondary | ICD-10-CM

## 2022-02-10 NOTE — Patient Instructions (Signed)
Medication Instructions:  TAKE CARVEDILOL 12.5MG  IN THE AM AND 25MG  IN THE PM  *If you need a refill on your cardiac medications before your next appointment, please call your pharmacy*  Lab Work:   Testing/Procedures:  NONE    NONE  Special Instructions PLEASE READ AND FOLLOW SALTY 6-ATTACHED-1,800mg  daily  PLEASE MAINTAIN PHYSICAL ACTIVITY AS TOLERATED   TAKE AND LOG YOUR BLOOD PRESSURE  Follow-Up: Your next appointment:  2-3 month(s) In Person with , MD  or [name], FNP      At [facility], you and your health needs are our priority.  As part of our continuing mission to provide you with exceptional heart care, we have created designated Provider Care Teams.  These Care Teams include your primary Cardiologist (physician) and Advanced Practice Providers (APPs -  Physician Assistants and Nurse Practitioners) who all work together to provide you with the care you need, when you need it.  Important Information About Sugar             6 SALTY THINGS TO AVOID     1,800MG  DAILY

## 2022-02-10 NOTE — Addendum Note (Signed)
Addended by: Alyson Ingles on: 02/10/2022 02:40 PM   Modules accepted: Orders

## 2022-04-11 NOTE — Progress Notes (Deleted)
Cardiology Clinic Note   Patient Name: [name] HatchJanice P Schor Date of Encounter: 04/11/2022  Primary Care Provider:  Tracey HarriesBouska, David, MD Primary Cardiologist:  Nanetta BattyJonathan Berry, MD  Patient Profile    [name] [name] 76 year old female presents the clinic today for follow-up evaluation of her acute on chronic diastolic CHF.  Past Medical History    Past Medical History:  Diagnosis Date   Arthritis    "knees, hips, hands, back" (09/15/2016)   Constipation    Coronary artery disease    NSTEMI-PCI March 2018   Exercise-induced asthma    Family history of adverse reaction to anesthesia    "daughter gets PONV too"   GERD (gastroesophageal reflux disease)    Heart murmur dx'd 09/14/2016   High cholesterol    Hypertension    Migraine    "recently had series of injections; none since; they had been weekly 1 month ago" (09/15/2016)   Necrotizing fasciitis (HCC)    R index finger- had a amp   NSTEMI (non-ST elevated myocardial infarction) (HCC)    Pneumonia ~ 2003   PONV (postoperative nausea and vomiting)    Patch helps   RSD (reflex sympathetic dystrophy)    Right arm after surgery   Past Surgical History:  Procedure Laterality Date   ANTERIOR LATERAL LUMBAR FUSION 4 LEVELS Right 09/20/2012   Procedure: ANTERIOR LATERAL LUMBAR FUSION 4 LEVELS;  Surgeon: Maeola HarmanJoseph Stern, MD;  Location: [facility];  Service: Neurosurgery;  Laterality: Right;  Right L1-2 L2-3 L3-4 L4-5 Anterolateral decompression/fusion/with L5-S1 Posterior lumbar interbody fusion/Percutaneous pedicle screws L1-S1   BACK SURGERY     CATARACT EXTRACTION W/ INTRAOCULAR LENS  IMPLANT, BILATERAL Bilateral 2017   CORONARY ANGIOPLASTY  05/14/2018   CORONARY ANGIOPLASTY WITH STENT PLACEMENT     CORONARY BALLOON ANGIOPLASTY N/A 05/14/2018   Procedure: CORONARY BALLOON ANGIOPLASTY;  Surgeon: Runell GessBerry, Jonathan J, MD;  Location: [facility];  Service: Cardiovascular;  Laterality: N/A;   CORONARY BALLOON ANGIOPLASTY N/A 04/25/2019    Procedure: CORONARY BALLOON ANGIOPLASTY;  Surgeon: Runell GessBerry, Jonathan J, MD;  Location: [facility];  Service: Cardiovascular;  Laterality: N/A;  diagonal 1st   CORONARY BALLOON ANGIOPLASTY N/A 01/20/2022   Procedure: CORONARY BALLOON ANGIOPLASTY;  Surgeon: Runell GessBerry, Jonathan J, MD;  Location: [facility];  Service: Cardiovascular;  Laterality: N/A;   CORONARY STENT INTERVENTION N/A 09/15/2016   Procedure: Coronary Stent Intervention;  Surgeon: Rinaldo CloudMohan Harwani, MD;  Location: [facility];  Service: Cardiovascular;  Laterality: N/A;   CORONARY STENT INTERVENTION N/A 05/14/2018   Procedure: CORONARY STENT INTERVENTION;  Surgeon: Runell GessBerry, Jonathan J, MD;  Location: [facility];  Service: Cardiovascular;  Laterality: N/A;   FINGER AMPUTATION Right 2008   Index finger   INTRAVASCULAR PRESSURE WIRE/FFR STUDY N/A 09/15/2016   Procedure: Intravascular Pressure Wire/FFR Study;  Surgeon: Rinaldo CloudMohan Harwani, MD;  [facility];  Service: Cardiovascular;  Laterality: N/A;   LEFT HEART CATH AND CORONARY ANGIOGRAPHY N/A 09/15/2016   Procedure: Left Heart Cath and Coronary Angiography;  Surgeon: Rinaldo CloudMohan Harwani, MD;  [facility];  Service: Cardiovascular;  Laterality: N/A;   LEFT HEART CATH AND CORONARY ANGIOGRAPHY N/A 05/14/2018   Procedure: LEFT HEART CATH AND CORONARY ANGIOGRAPHY;  Surgeon: Runell GessBerry, Jonathan J, MD;  [facility]: [facility];  Service: Cardiovascular;  Laterality: N/A;   LEFT HEART CATH AND CORONARY ANGIOGRAPHY N/A 04/25/2019   Procedure: LEFT HEART CATH AND CORONARY ANGIOGRAPHY;  Surgeon: Nanetta BattyBerry, Jonathan  J, MD;  Location: [facility];  Service: Cardiovascular;  Laterality: N/A;   LEFT HEART CATH AND CORONARY ANGIOGRAPHY N/A 01/20/2022   Procedure: LEFT HEART CATH AND CORONARY ANGIOGRAPHY;  Surgeon: Runell Gess, MD;  [facility]: [facility];  Service: Cardiovascular;  Laterality: N/A;   RIGHT/LEFT HEART CATH AND CORONARY ANGIOGRAPHY N/A  01/20/2022   Procedure: RIGHT/LEFT HEART CATH AND CORONARY ANGIOGRAPHY;  Surgeon: Runell Gess, MD;  [facility]: [facility];  Service: Cardiovascular;  Laterality: N/A;   TONSILLECTOMY      Allergies  Allergies  Allergen Reactions   Codeine Anaphylaxis   Garlic Anaphylaxis   Morphine And Related Nausea And Vomiting   Atorvastatin Other (See Comments)    Muscle aches   Pravastatin Sodium Other (See Comments)    Muscle pain    History of Present Illness    [name] [name] is a PMH of coronary artery disease, HTN, HLD, and acute on chronic diastolic CHF.  She presented to the emergency department on 01/18/2022 and was discharged on 01/21/2022.  She complained of increased shortness of breath.  She was diagnosed with acute on chronic diastolic CHF.  It was felt that she was having anginal equivalent type chest discomfort.  She was admitted.  Cardiology was consulted.  Her echocardiogram showed normal LV function.  She underwent cardiac catheterization which showed 95% stenosed first diagonal, and mid LAD 60% stenosis.  She underwent PCTA and was noted to have 25% residual stenosis post dilation.  She was placed on clopidogrel and aspirin.  She presented to the clinic 02/10/2022 for follow-up evaluation stated that she initially had an improvement in her breathing post cardiac catheterization and angioplasty.  At that time she reported that her work of breathing was increasing.  She noticed that she was able to do physical activity however her endurance was reduced and she noticed increased fatigue.  She also reported that her valsartan was discontinued while in the hospital due to low blood pressure.  She continued to notice low blood pressure at home.  We reviewed her echocardiogram and cardiac catheterization as well as her labs.  I will decreased her carvedilol to 12.5 mg during the day and continue her 25 mg in the evening.  She reported that she was doing physical therapy exercises 7  days/week.  Asked her to take a rest day through the week and slowly build her physical activity.  She had been taking her furosemide as needed.  We  planned follow-up in 2-3 months.  She presents to the clinic today for follow-up evaluation and states***  Today she denies chest pain,  lower extremity edema,  palpitations, melena, hematuria, hemoptysis, diaphoresis, weakness, presyncope, syncope, orthopnea, and PND.   Home Medications    Prior to Admission medications   Medication Sig Start Date End Date Taking? Authorizing Provider  acetaminophen (TYLENOL) 500 MG tablet Take 500-650 mg by mouth every 6 (six) hours as needed for headache or moderate pain.    [provider]  AIMOVIG 70 MG/ML SOAJ Inject 70 mg into the skin every 30 (thirty) days. 10/15/18   [provider]  albuterol (PROVENTIL HFA;VENTOLIN HFA) 108 (90 Base) MCG/ACT inhaler Inhale 2 puffs into the lungs 4 (four) times daily as needed for wheezing or shortness of breath. 07/26/13   [provider]  amLODipine (NORVASC) 5 MG tablet Take 1 tablet (5 mg total) by mouth daily. 01/21/22   Osvaldo Shipper, MD  aspirin EC 81 MG  tablet Take 81 mg by mouth daily.    [provider]  carvedilol (COREG) 25 MG tablet Take 25 mg by mouth 2 (two) times daily with a meal.    [provider]  clopidogrel (PLAVIX) 75 MG tablet TAKE 1 TABLET BY MOUTH DAILY. Patient taking differently: Take 75 mg by mouth daily. 08/13/21   Lorretta Harp, MD  fluticasone (FLONASE) 50 MCG/ACT nasal spray Place 1-2 sprays into both nostrils daily as needed for allergies.    [provider]  furosemide (LASIX) 40 MG tablet Take 1 tablet (40 mg total) by mouth daily. 02/02/22   O'Neal, Cassie Freer, MD  levocetirizine (XYZAL) 5 MG tablet Take 5 mg by mouth every evening.    [provider]  nitroGLYCERIN (NITROSTAT) 0.4 MG SL tablet Place 1 tablet (0.4 mg total) under the tongue every 5 (five) minutes x 3  doses as needed. Patient taking differently: Place 0.4 mg under the tongue every 5 (five) minutes x 3 doses as needed for chest pain. 02/22/18   Lorretta Harp, MD  nortriptyline (PAMELOR) 25 MG capsule Take 25 mg by mouth 2 (two) times daily.    [provider]  pantoprazole (PROTONIX) 40 MG tablet Take 1 tablet (40 mg total) by mouth daily. 05/15/18   Cheryln Manly, NP  potassium chloride SA (KLOR-CON M) 20 MEQ tablet Take 1 tablet (20 mEq total) by mouth daily. 02/02/22   O'Neal, Cassie Freer, MD  Propylene Glycol (SYSTANE COMPLETE OP) Place 1 drop into both eyes daily as needed (dry eyes).    [provider]  REPATHA SURECLICK 008 MG/ML SOAJ INJECT 140 MG INTO THE SKIN EVERY 14 DAYS. Patient taking differently: Inject 140 mg into the skin every 14 (fourteen) days. 10/21/21   Lorretta Harp, MD  sertraline (ZOLOFT) 100 MG tablet Take 100 mg by mouth daily. 10/19/21   [provider]  valsartan (DIOVAN) 320 MG tablet Take 1 tablet (320 mg total) by mouth daily. 08/18/21   Lorretta Harp, MD  Vitamin D, Ergocalciferol, (DRISDOL) 1.25 MG (50000 UNIT) CAPS capsule Take 50,000 Units by mouth once a week. 07/07/21   [provider]    Family History    Family History  Problem Relation Age of Onset   Heart failure Mother    Cancer Mother 15       Breast   Kidney failure Father    Brain cancer Sister    She indicated that her mother is deceased. She indicated that her father is deceased. She indicated that the status of her sister is unknown. She indicated that her maternal grandmother is deceased. She indicated that her maternal grandfather is deceased. She indicated that her paternal grandmother is deceased. She indicated that her paternal grandfather is deceased.  Social History    Social History   Socioeconomic History   Marital status: Widowed    Spouse name: Not on file   Number of children: Not on file   Years of education: Not on file    Highest education level: Not on file  Occupational History   Not on file  Tobacco Use   Smoking status: Never   Smokeless tobacco: Never  Vaping Use   Vaping Use: Never used  Substance and Sexual Activity   Alcohol use: No   Drug use: No   Sexual activity: Not on file  Other Topics Concern   Not on file  Social History Narrative   Not on file  Social Determinants of Health   Financial Resource Strain: Not on file  Food Insecurity: Not on file  Transportation Needs: Not on file  Physical Activity: Not on file  Stress: Not on file  Social Connections: Not on file  Intimate Partner Violence: Not on file     Review of Systems    General:  No chills, fever, night sweats or weight changes.  Cardiovascular:  No chest pain, dyspnea on exertion, edema, orthopnea, palpitations, paroxysmal nocturnal dyspnea. Dermatological: No rash, lesions/masses Respiratory: No cough, dyspnea Urologic: No hematuria, dysuria Abdominal:   No nausea, vomiting, diarrhea, bright red blood per rectum, melena, or hematemesis Neurologic:  No visual changes, wkns, changes in mental status. All other systems reviewed and are otherwise negative except as noted above.  Physical Exam    VS:  There were no vitals taken for this visit. , BMI There is no height or weight on file to calculate BMI. GEN: Well nourished, well developed, in no acute distress. HEENT: normal. Neck: Supple, no JVD, carotid bruits, or masses. Cardiac: RRR, no murmurs, rubs, or gallops. No clubbing, cyanosis, edema.  Radials/DP/PT 2+ and equal bilaterally.  Respiratory:  Respirations regular and unlabored, clear to auscultation bilaterally. GI: Soft, nontender, nondistended, BS + x 4. MS: no deformity or atrophy. Skin: warm and dry, no rash. Neuro:  Strength and sensation are intact. Psych: Normal affect.  Accessory Clinical Findings    Recent Labs: 01/17/2022: ALT 15; B Natriuretic Peptide 115.8 01/21/2022: BUN 18; Creatinine,  Ser 0.73; Hemoglobin 12.5; Platelets 264; Potassium 3.5; Sodium 138   Recent Lipid Panel    Component Value Date/Time   CHOL 133 01/21/2022 0253   CHOL 117 03/06/2020 0828   TRIG 185 (H) 01/21/2022 0253   HDL 42 01/21/2022 0253   HDL 54 03/06/2020 0828   CHOLHDL 3.2 01/21/2022 0253   VLDL 37 01/21/2022 0253   LDLCALC 54 01/21/2022 0253   LDLCALC 41 03/06/2020 0828    ECG personally reviewed by me today-none today.  EKG 02/10/2022 sinus bradycardia 55 bpm- No acute changes  Echocardiogram 01/19/2022  IMPRESSIONS     1. Left ventricular ejection fraction, by estimation, is 60 to 65%. The  left ventricle has normal function. The left ventricle has no regional  wall motion abnormalities. Left ventricular diastolic parameters were  normal.   2. Right ventricular systolic function is normal. The right ventricular  size is normal. There is normal pulmonary artery systolic pressure.   3. The mitral valve is normal in structure. No evidence of mitral valve  regurgitation. No evidence of mitral stenosis.   4. The aortic valve is tricuspid. Aortic valve regurgitation is trivial.  No aortic stenosis is present.   5. The inferior vena cava is normal in size with greater than 50%  respiratory variability, suggesting right atrial pressure of 3 mmHg.  Cardiac catheterization 01/20/2022    Mid LAD lesion is 60% stenosed.   1st Diag lesion is 95% stenosed.   Non-stenotic Prox LAD to Mid LAD lesion was previously treated.   Balloon angioplasty was performed.   Post intervention, there is a 25% residual stenosis.   MERLEAN PIZZINI is a 76 y.o. female      [id number] LOCATION:  FACILITY: [facility]  PHYSICIAN: [name], M.D. 11-Apr-1946     DATE OF PROCEDURE:  01/20/2022  Diagnostic Dominance: Left  Intervention    Assessment & Plan   1.  Chronic diastolic CHF-euvolemic today.  Weight today 173.4 stable ***pounds.  NYHA class  II-3.  Echocardiogram showed LVEF of 60-65% with  normal diastolic parameters Continue losartan Heart healthy low-sodium diet Continue to increase physical activity as tolerated  Coronary artery disease-no chest discomfort today.  With cardiac catheterization with balloon angioplasty on 01/20/2022.  Details above. Continue aspirin, clopidogrel,  amlodipine nitroglycerin Reduce ***carvedilol to 12.5 mg in a.m. and continue 25 mg in p.m. Heart healthy low-sodium diet Increase physical activity as tolerated  Essential hypertension-BP today 112/60*** Continue losartan, carvedilol, amlodipine Heart healthy low-sodium diet-salty 6 given Increase physical activity as tolerated  Hyperlipidemia-LDL 54 01/21/22 Continue aspirin Repatha Heart healthy low-sodium high-fiber diet Increase physical activity as tolerated  Disposition: Follow-up with Dr. Allyson Sabal in 3-4 months.   Thomasene Ripple. Tiya Schrupp NP-C     04/11/2022, 1:12 PM [facility] [address] Office [phone] Fax [phone]  Notice: This dictation was prepared with Dragon dictation along with smaller phrase technology. Any transcriptional errors that result from this process are unintentional and may not be corrected upon review.  I spent 15*** minutes examining this patient, reviewing medications, and using patient centered shared decision making involving her cardiac care.  Prior to her visit I spent greater than 20 minutes reviewing her past medical history,  medications, and prior cardiac tests.

## 2022-04-13 ENCOUNTER — Ambulatory Visit: Payer: Medicare Other | Admitting: General Practice

## 2022-05-03 ENCOUNTER — Encounter: Payer: Self-pay | Admitting: Cardiovascular Disease

## 2022-05-03 ENCOUNTER — Ambulatory Visit: Payer: Medicare Other | Attending: General Practice | Admitting: Cardiovascular Disease

## 2022-05-03 DIAGNOSIS — R0609 Other forms of dyspnea: Secondary | ICD-10-CM

## 2022-05-03 DIAGNOSIS — E785 Hyperlipidemia, unspecified: Secondary | ICD-10-CM

## 2022-05-03 DIAGNOSIS — Z9861 Coronary angioplasty status: Secondary | ICD-10-CM

## 2022-05-03 DIAGNOSIS — I2510 Atherosclerotic heart disease of native coronary artery without angina pectoris: Secondary | ICD-10-CM

## 2022-05-03 DIAGNOSIS — I10 Essential (primary) hypertension: Secondary | ICD-10-CM

## 2022-05-03 NOTE — Assessment & Plan Note (Signed)
History of essential hypertension a blood pressure measured today at 132/74.  She is on carvedilol.  She had been on amlodipine in the past which was discontinued.

## 2022-05-03 NOTE — Patient Instructions (Signed)
Medication Instructions:  Your physician recommends that you continue on your current medications as directed. Please refer to the Current Medication list given to you today.  *If you need a refill on your cardiac medications before your next appointment, please call your pharmacy*   Follow-Up: At [facility], you and your health needs are our priority.  As part of our continuing mission to provide you with exceptional heart care, we have created designated Provider Care Teams.  These Care Teams include your primary Cardiologist (physician) and Advanced Practice Providers (APPs -  Physician Assistants and Nurse Practitioners) who all work together to provide you with the care you need, when you need it.  We recommend signing up for the patient portal called "MyChart".  Sign up information is provided on this After Visit Summary.  MyChart is used to connect with patients for Virtual Visits (Telemedicine).  Patients are able to view lab/test results, encounter notes, upcoming appointments, etc.  Non-urgent messages can be sent to your provider as well.   To learn more about what you can do with MyChart, go to [email or web address].    Your next appointment:   6 month(s)  The format for your next appointment:   In Person  Provider:   Jesse Cleaver, FNP       Then, Jonathan Berry, MD will plan to see you again in 12 month(s).  

## 2022-05-03 NOTE — Assessment & Plan Note (Signed)
History of hyperlipidemia on Repatha with lipid profile performed 01/21/2022 revealing total cholesterol 133, LDL 54 and HDL 42.

## 2022-05-03 NOTE — Assessment & Plan Note (Signed)
History of CAD status post LAD stenting by Dr. Terrence Dupont March 2018.  I cath her 04/25/2019 revealing a patent LAD stent, 40% disease just beyond this and 90% ostial diagonal branch stenosis.  I performed PCI of the diagonal branch ostium using a 2.25 mm balloon through the stent struts with an excellent result.  She was recently hospitalized in July 2023 with volume overload as well as symptoms similar to she had prior to her diagonal branch intervention.  I recath her 01/20/2022 revealing a patent LAD stent, 60% stenosis just beyond the stented segment that was negative by DFR and 90% ostial diagonal branch stenosis which I angioplastied less than 25%.  She remains on dual antiplatelet therapy.

## 2022-05-03 NOTE — Assessment & Plan Note (Signed)
Patient complains of some dyspnea on exertion and fatigue.  Her echo did not suggest diastolic dysfunction and right heart cath when I performed her procedure in July showed that her filling pressures were low.  She is on 40 mg of furosemide daily and has no peripheral edema on exam today.

## 2022-05-03 NOTE — Progress Notes (Signed)
05/03/2022 [name] [name]   1946-01-30  [id number]  Primary Physician Tracey Harries, MD Primary Cardiologist: Runell Gess MD FACP, [place], [place], [place]  HPI:  [name] [name] is a 76 y.o.  mildly overweight married Caucasian female mother of 3, grandmother of 11 children who I I last saw her in the office 1//23.  Unfortunately, her husband of 54 years died of a massive pulmonary embolism in March of this year.  He was somewhat immobile but had received his Covid vaccine 2 weeks prior to that.  She currently lives alone.  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  She is never smoked.  Her mother did have a myocardial infarction.  She has hypertension hyperlipidemia.  She had a non-STEMI March 2018 and LAD intervention but Dr. Sharyn Lull.  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  She did have a jailed first diagonal branch which was small and a 50% mid LAD beyond the stented segment which I did not think was significant.     Because of progressive dyspnea on exertion and substernal chest pain which I developed a week or so prior to her most recent office visit on 04/23/2019  I was concerned that she may have aggressive in-stent restenosis with her previously placed stents.  I performed diagnostic coronary angiography on her 04/25/2019 revealing a patent LAD stented segment, 40% disease beyond the LAD stent and 90% ostial diagonal branch stenosis.  I performed PCI using a 2.25 mm balloon of the diagonal branch through the stent struts resulting reduction of a 90% ostial first diagonal branch stenosis to less than 30%.  She was discharged home the following day.  Her symptoms have completely resolved.   Since I saw her in the office in January, she was hospitalized in July with chest pain and volume overload.  She was diuresed and underwent right left heart cath by myself 01/20/2022 revealing a patent LAD stent, 60%  stenosis in the LAD just beyond the stented segment with negative DFR and 95% ostial first diagonal branch stenosis which I read to be done through the stent struts as I had 3 years ago her major complaints are fatigue.  She does have some dyspnea.  She is on furosemide 40 mg a day for lower extremity edema.  She is dry on exam today.  r.   Current Meds  Medication Sig   acetaminophen (TYLENOL) 500 MG tablet Take 500-650 mg by mouth every 6 (six) hours as needed for headache or moderate pain.   AIMOVIG 70 MG/ML SOAJ Inject 70 mg into the skin every 30 (thirty) days.   albuterol (PROVENTIL HFA;VENTOLIN HFA) 108 (90 Base) MCG/ACT inhaler Inhale 2 puffs into the lungs 4 (four) times daily as needed for wheezing or shortness of breath.   aspirin EC 81 MG tablet Take 81 mg by mouth daily.   carvedilol (COREG) 25 MG tablet Take 25 mg by mouth 2 (two) times daily with a meal. 12.5MG  AM AND 25MG  PM   clopidogrel (PLAVIX) 75 MG tablet TAKE 1 TABLET BY MOUTH DAILY. (Patient taking differently: Take 75 mg by mouth daily.)   fluticasone (FLONASE) 50 MCG/ACT nasal spray Place 1-2 sprays into both nostrils daily as needed for allergies.   furosemide (LASIX) 40 MG tablet Take 1 tablet (40 mg total) by mouth daily.   levocetirizine (XYZAL) 5 MG tablet Take 5 mg by mouth every evening.   nitroGLYCERIN (  NITROSTAT) 0.4 MG SL tablet Place 1 tablet (0.4 mg total) under the tongue every 5 (five) minutes x 3 doses as needed. (Patient taking differently: Place 0.4 mg under the tongue every 5 (five) minutes x 3 doses as needed for chest pain.)   nortriptyline (PAMELOR) 25 MG capsule Take 25 mg by mouth 2 (two) times daily.   pantoprazole (PROTONIX) 40 MG tablet Take 1 tablet (40 mg total) by mouth daily.   potassium chloride SA (KLOR-CON M) 20 MEQ tablet Take 1 tablet (20 mEq total) by mouth daily.   Propylene Glycol (SYSTANE COMPLETE OP) Place 1 drop into both eyes daily as needed (dry eyes).   REPATHA SURECLICK 140  MG/ML SOAJ INJECT 140 MG INTO THE SKIN EVERY 14 DAYS. (Patient taking differently: Inject 140 mg into the skin every 14 (fourteen) days.)   sertraline (ZOLOFT) 100 MG tablet Take 100 mg by mouth daily.   Vitamin D, Ergocalciferol, (DRISDOL) 1.25 MG (50000 UNIT) CAPS capsule Take 50,000 Units by mouth once a week.     Allergies  Allergen Reactions   Codeine Anaphylaxis   Garlic Anaphylaxis   Morphine And Related Nausea And Vomiting   Atorvastatin Other (See Comments)    Muscle aches   Pravastatin Sodium Other (See Comments)    Muscle pain    Social History   Socioeconomic History   Marital status: Widowed    Spouse name: Not on file   Number of children: Not on file   Years of education: Not on file   Highest education level: Not on file  Occupational History   Not on file  Tobacco Use   Smoking status: Never   Smokeless tobacco: Never  Vaping Use   Vaping Use: Never used  Substance and Sexual Activity   Alcohol use: No   Drug use: No   Sexual activity: Not on file  Other Topics Concern   Not on file  Social History Narrative   Not on file   Social Determinants of Health   Financial Resource Strain: Not on file  Food Insecurity: Not on file  Transportation Needs: Not on file  Physical Activity: Not on file  Stress: Not on file  Social Connections: Not on file  Intimate Partner Violence: Not on file     Review of Systems: General: negative for chills, fever, night sweats or weight changes.  Cardiovascular: negative for chest pain, dyspnea on exertion, edema, orthopnea, palpitations, paroxysmal nocturnal dyspnea or shortness of breath Dermatological: negative for rash Respiratory: negative for cough or wheezing Urologic: negative for hematuria Abdominal: negative for nausea, vomiting, diarrhea, bright red blood per rectum, melena, or hematemesis Neurologic: negative for visual changes, syncope, or dizziness All other systems reviewed and are otherwise negative  except as noted above.    Blood pressure 132/74, pulse 67, weight 179 lb 12.8 oz (81.6 kg), SpO2 95 %.  General appearance: alert and no distress Neck: no adenopathy, no carotid bruit, no JVD, supple, symmetrical, trachea midline, and thyroid not enlarged, symmetric, no tenderness/mass/nodules Lungs: clear to auscultation bilaterally Heart: regular rate and rhythm, S1, S2 normal, no murmur, click, rub or gallop Extremities: extremities normal, atraumatic, no cyanosis or edema Pulses: 2+ and symmetric Skin: Skin color, texture, turgor normal. No rashes or lesions Neurologic: Grossly normal  EKG not performed today  ASSESSMENT AND PLAN:   Essential hypertension History of essential hypertension a blood pressure measured today at 132/74.  She is on carvedilol.  She had been on amlodipine in the past  which was discontinued.  Dyslipidemia, goal LDL below 70 History of hyperlipidemia on Repatha with lipid profile performed 01/21/2022 revealing total cholesterol 133, LDL 54 and HDL 42.  CAD S/P PCI History of CAD status post LAD stenting by Dr. Terrence Dupont March 2018.  I cath her 04/25/2019 revealing a patent LAD stent, 40% disease just beyond this and 90% ostial diagonal branch stenosis.  I performed PCI of the diagonal branch ostium using a 2.25 mm balloon through the stent struts with an excellent result.  She was recently hospitalized in July 2023 with volume overload as well as symptoms similar to she had prior to her diagonal branch intervention.  I recath her 01/20/2022 revealing a patent LAD stent, 60% stenosis just beyond the stented segment that was negative by DFR and 90% ostial diagonal branch stenosis which I angioplastied less than 25%.  She remains on dual antiplatelet therapy.  DOE (dyspnea on exertion) Patient complains of some dyspnea on exertion and fatigue.  Her echo did not suggest diastolic dysfunction and right heart cath when I performed her procedure in July showed that her  filling pressures were low.  She is on 40 mg of furosemide daily and has no peripheral edema on exam today.     Lorretta Harp MD FACP,FACC,FAHA, [facility] 05/03/2022 4:48 PM

## 2022-06-23 ENCOUNTER — Encounter (HOSPITAL_COMMUNITY): Payer: Self-pay

## 2022-06-23 ENCOUNTER — Telehealth (HOSPITAL_COMMUNITY): Payer: Self-pay

## 2022-06-23 NOTE — Telephone Encounter (Signed)
Attempted to call patient in regards to Cardiac Rehab - LM on VM Mailed letter 

## 2022-08-08 ENCOUNTER — Other Ambulatory Visit (HOSPITAL_COMMUNITY): Payer: Self-pay

## 2022-08-26 ENCOUNTER — Other Ambulatory Visit: Payer: Self-pay | Admitting: Cardiovascular Disease

## 2022-11-15 ENCOUNTER — Ambulatory Visit: Payer: Medicare Other | Admitting: Pulmonary Disease

## 2022-11-15 ENCOUNTER — Encounter: Payer: Self-pay | Admitting: Pulmonary Disease

## 2022-11-15 VITALS — BP 122/76 | HR 66 | Temp 98.0°F | Ht 65.0 in | Wt 180.4 lb

## 2022-11-15 DIAGNOSIS — R0609 Other forms of dyspnea: Secondary | ICD-10-CM | POA: Diagnosis not present

## 2022-11-15 MED ORDER — FLUTICASONE-SALMETEROL 250-50 MCG/ACT IN AEPB: 1.0000 | INHALATION_SPRAY | Freq: Two times a day (BID) | RESPIRATORY_TRACT | 3 refills | Status: AC

## 2022-11-15 MED ORDER — ALBUTEROL SULFATE HFA 108 (90 BASE) MCG/ACT IN AERS: 2.0000 | INHALATION_SPRAY | Freq: Four times a day (QID) | RESPIRATORY_TRACT | 6 refills | Status: AC | PRN

## 2022-11-15 NOTE — Progress Notes (Addendum)
@Patient  ID: [name] [name], female    DOB: [birth year], 77 y.o.   MRN: [id number]  Chief Complaint  Patient presents with   Consult    Sob, has been diagnosed with asthma. Has been experiencing sob for a year now. Pt believed it was her heart so she went to cardio.     Referring provider: Tracey Harries, MD  HPI:   77 y.o. woman whom we are seeing for evaluation of dyspnea on exertion.  Most recent PCP note reviewed.  Most recent cardiology note x 2 reviewed.  Patient additional exertion going on about a year now.  Presented to her PCP for evaluation.  Underwent left heart catheterization and POBA to first diagonal with 95% stenosed also with a 60% mid LAD lesion.  She got no benefit from this.  This led to CTA PE protocol that showed no PE but did demonstrate  left lower lobe 8 mm 01/2022.  Otherwise clear parenchyma on my review interpretation.  Repeat CT scan 08/2022 via [facility], images not available to me, demonstrates reported mild bronchiectasis, 7 mm partially calcified nodule in the left lower lobe previously described as noncalcified, small 4 mm fissure right lower lobe nodule not described on prior imaging.  Chest history of asthma.  On inhalers in the past.  Has albuterol at home for exposure to the garlic.  Has not used in years.  It is expired.  In the past she had more cough when her asthma was poorly controlled.  She does not recall if she used back then.  This was years ago.  Initially denied much cough.  Then described coughing fits with deep breathing.  Some coughing on exam with deep breath as well.    Questionaires / Pulmonary Flowsheets:   ACT:      No data to display          MMRC:     No data to display          Epworth:      No data to display          Tests:   FENO:  No results found for: "NITRICOXIDE"  PFT:     No data to display          WALK:      No data to display          Imaging: Personally reviewed and as per EMR  and discussion in this note No results found.  Lab Results: Personally reviewed CBC    Component Value Date/Time   WBC 8.5 01/21/2022 0253   RBC 4.37 01/21/2022 0253   HGB 12.5 01/21/2022 0253   HGB 14.2 04/24/2019 1138   HCT 37.6 01/21/2022 0253   HCT 42.8 04/24/2019 1138   PLT 264 01/21/2022 0253   PLT 298 04/24/2019 1138   MCV 86.0 01/21/2022 0253   MCV 87 04/24/2019 1138   MCH 28.6 01/21/2022 0253   MCHC 33.2 01/21/2022 0253   RDW 13.5 01/21/2022 0253   RDW 13.4 04/24/2019 1138   LYMPHSABS 1.4 01/17/2022 1434   MONOABS 0.4 01/17/2022 1434   EOSABS 0.3 01/17/2022 1434   BASOSABS 0.0 01/17/2022 1434    BMET    Component Value Date/Time   NA 138 01/21/2022 0253   NA 146 (H) 08/13/2021 1550   K 3.5 01/21/2022 0253   CL 108 01/21/2022 0253   CO2 19 (L) 01/21/2022 0253   GLUCOSE 90 01/21/2022 0253   BUN 18 01/21/2022 0253  BUN 9 08/13/2021 1550   CREATININE 0.73 01/21/2022 0253   CALCIUM 9.4 01/21/2022 0253   GFRNONAA >60 01/21/2022 0253   GFRAA >60 04/26/2019 0438    BNP    Component Value Date/Time   BNP 115.8 (H) 01/17/2022 1434    ProBNP No results found for: "PROBNP"  Specialty Problems       Pulmonary Problems   Allergic rhinitis   Extrinsic asthma   DOE (dyspnea on exertion)    Dyspnea on exertion       Allergies  Allergen Reactions   Codeine Anaphylaxis   Garlic Anaphylaxis   Morphine And Related Nausea And Vomiting   Atorvastatin Other (See Comments)    Muscle aches   Pravastatin Sodium Other (See Comments)    Muscle pain    Immunization History  Administered Date(s) Administered   PFIZER Comirnaty(Gray Top)Covid-19 Tri-Sucrose Vaccine 08/10/2020   PFIZER(Purple Top)SARS-COV-2 Vaccination 08/23/2019, 09/13/2019, 08/10/2020    Past Medical History:  Diagnosis Date   Arthritis    "knees, hips, hands, back" (09/15/2016)   Constipation    Coronary artery disease    NSTEMI-PCI March 2018   Exercise-induced asthma    Family  history of adverse reaction to anesthesia    "daughter gets PONV too"   GERD (gastroesophageal reflux disease)    Heart murmur dx'd 09/14/2016   High cholesterol    Hypertension    Migraine    "recently had series of injections; none since; they had been weekly 1 month ago" (09/15/2016)   Necrotizing fasciitis (HCC)    R index finger- had a amp   NSTEMI (non-ST elevated myocardial infarction) (HCC)    Pneumonia ~ 2003   PONV (postoperative nausea and vomiting)    Patch helps   RSD (reflex sympathetic dystrophy)    Right arm after surgery    Tobacco History: Social History   Tobacco Use  Smoking Status Never  Smokeless Tobacco Never   Counseling given: Not Answered   Continue to not smoke  Outpatient Encounter Medications as of 11/15/2022  Medication Sig   acetaminophen (TYLENOL) 500 MG tablet Take 500-650 mg by mouth every 6 (six) hours as needed for headache or moderate pain.   AIMOVIG 70 MG/ML SOAJ Inject 70 mg into the skin every 30 (thirty) days.   albuterol (VENTOLIN HFA) 108 (90 Base) MCG/ACT inhaler Inhale 2 puffs into the lungs every 6 (six) hours as needed for wheezing or shortness of breath.   aspirin EC 81 MG tablet Take 81 mg by mouth daily.   clopidogrel (PLAVIX) 75 MG tablet Take 1 tablet (75 mg total) by mouth daily.   cyanocobalamin (VITAMIN B12) 1000 MCG/ML injection Inject 1,000 mcg into the skin every 30 (thirty) days.   fluticasone (FLONASE) 50 MCG/ACT nasal spray Place 1-2 sprays into both nostrils daily as needed for allergies.   fluticasone-salmeterol (ADVAIR) 250-50 MCG/ACT AEPB Inhale 1 puff into the lungs in the morning and at bedtime.   furosemide (LASIX) 40 MG tablet Take 1 tablet (40 mg total) by mouth daily.   levocetirizine (XYZAL) 5 MG tablet Take 5 mg by mouth every evening.   montelukast (SINGULAIR) 10 MG tablet Take 10 mg by mouth daily.   nitroGLYCERIN (NITROSTAT) 0.4 MG SL tablet Place 1 tablet (0.4 mg total) under the tongue every 5 (five)  minutes x 3 doses as needed. (Patient taking differently: Place 0.4 mg under the tongue every 5 (five) minutes x 3 doses as needed for chest pain.)   nortriptyline (PAMELOR)  25 MG capsule Take 25 mg by mouth 2 (two) times daily.   pantoprazole (PROTONIX) 40 MG tablet Take 1 tablet (40 mg total) by mouth daily.   potassium chloride SA (KLOR-CON M) 20 MEQ tablet Take 1 tablet (20 mEq total) by mouth daily.   Propylene Glycol (SYSTANE COMPLETE OP) Place 1 drop into both eyes daily as needed (dry eyes).   REPATHA SURECLICK 140 MG/ML SOAJ INJECT 140 MG INTO THE SKIN EVERY 14 DAYS. (Patient taking differently: Inject 140 mg into the skin every 14 (fourteen) days.)   ticagrelor (BRILINTA) 90 MG TABS tablet Take 90 mg by mouth 2 (two) times daily.   Vitamin D, Ergocalciferol, (DRISDOL) 1.25 MG (50000 UNIT) CAPS capsule Take 50,000 Units by mouth once a week.   amLODipine (NORVASC) 5 MG tablet Take 1 tablet (5 mg total) by mouth daily.   carvedilol (COREG) 25 MG tablet Take 25 mg by mouth 2 (two) times daily with a meal. 12.5MG  AM AND 25MG  PM (Patient not taking: Reported on 11/15/2022)   sertraline (ZOLOFT) 100 MG tablet Take 100 mg by mouth daily. (Patient not taking: Reported on 11/15/2022)   [DISCONTINUED] albuterol (PROVENTIL HFA;VENTOLIN HFA) 108 (90 Base) MCG/ACT inhaler Inhale 2 puffs into the lungs 4 (four) times daily as needed for wheezing or shortness of breath. (Patient not taking: Reported on 11/15/2022)   No facility-administered encounter medications on file as of 11/15/2022.     Review of Systems  Review of Systems  No chest pain with exertion.  No orthopnea or PND.  Comprehensive review of systems otherwise negative. Physical Exam  BP 122/76   Pulse 66   Temp 98 F (36.7 C) (Oral)   Ht 5\' 5"  (1.651 m)   Wt 180 lb 6.4 oz (81.8 kg)   SpO2 97%   BMI 30.02 kg/m   Wt Readings from Last 5 Encounters:  11/15/22 180 lb 6.4 oz (81.8 kg)  05/03/22 179 lb 12.8 oz (81.6 kg)  02/10/22 173  lb 6.4 oz (78.7 kg)  01/21/22 171 lb 14.4 oz (78 kg)  09/16/21 178 lb (80.7 kg)    BMI Readings from Last 5 Encounters:  11/15/22 30.02 kg/m  05/03/22 29.92 kg/m  02/10/22 28.86 kg/m  01/21/22 28.61 kg/m  09/16/21 29.17 kg/m     Physical Exam General: Sitting in chair, no acute distress Eyes: EOMI, no icterus Neck: Supple, no JVP Pulmonary: Clear, bronchial spasmodic cough with deep breathing, normal work of breathing Cardiovascular: Warm, no edema Abdomen: Nondistended Present MSK: No synovitis, no joint effusion Neuro: Normal gait, no weakness Psych: Normal mood, full affect   Assessment & Plan:   Dyspnea on exertion: Present for over a year.  Not improved with antibiotic.  History of asthma in the past.  Possible reemergence of asthma.  Some cough with deep breathing, concern for bronchospasm.  Chest imaging clear.  PFTs for further evaluation.  Trial Advair mid dose Diskus 1 puff twice daily.  Albuterol as needed.  Cardiac etiology is considered, she describes no improvement in symptoms despite POBA 01/2022 whereas in the past she has benefit very much so from stent placement.  Lung nodule: Scattered multiple, largest left lower lobe 01/2022.  Stable on 41-month scan via [facility].  Plan for scan 08/2023 for 2-month follow-up per Fleischner criteria.   Return in about 2 months (around 01/15/2023) for after PFT, f/u Dr. Judeth Horn.   Karren Burly, MD 11/15/2022   This appointment required 60 minutes of patient care (this includes precharting,  chart review, review of results, face-to-face care, etc.).

## 2022-11-15 NOTE — Patient Instructions (Addendum)
Nice to meet you  Given your history of asthma it is quite possible the symptoms have come back and are manifesting slight different than before.  The fact that you had some coughing with deep breathing on exam as well as you mention coughing with deep breathing at home does not think of spasm over the air to be continue with asthma.  To try to treat this use Advair discus 1 inhalation twice daily.  Rinse your mouth out with water after every use.  If this is too expensive please send me a message and I will look for more cost effective solution.  I also refilled albuterol inhaler, this is your rescue inhaler.  Use with coughing fits or times of shortness of breath or when exposed to garlic as you have in the past.  To further evaluate your symptoms, I ordered pulmonary function test.  These could be scheduled next available at your convenience.    Return to clinic in 2 months or sooner as needed with Dr. Judeth Horn for follow-up with PFTs as well as evaluation of your symptoms

## 2022-11-22 NOTE — Progress Notes (Unsigned)
Cardiology Clinic Note   Patient Name: [name] [name] Date of Encounter: 11/24/2022  Primary Care Provider:  Tracey Harries, MD Primary Cardiologist:  [name], MD  Patient Profile    [name] [name] 77 year old female presents the clinic today for follow-up evaluation of her acute on chronic diastolic CHF.  Past Medical History    Past Medical History:  Diagnosis Date   Arthritis    "knees, hips, hands, back" (09/15/2016)   Constipation    Coronary artery disease    NSTEMI-PCI March 2018   Exercise-induced asthma    Family history of adverse reaction to anesthesia    "daughter gets PONV too"   GERD (gastroesophageal reflux disease)    Heart murmur dx'd 09/14/2016   High cholesterol    Hypertension    Migraine    "recently had series of injections; none since; they had been weekly 1 month ago" (09/15/2016)   Necrotizing fasciitis (HCC)    R index finger- had a amp   NSTEMI (non-ST elevated myocardial infarction) (HCC)    Pneumonia ~ 2003   PONV (postoperative nausea and vomiting)    Patch helps   RSD (reflex sympathetic dystrophy)    Right arm after surgery   Past Surgical History:  Procedure Laterality Date   ANTERIOR LATERAL LUMBAR FUSION 4 LEVELS Right 09/20/2012   Procedure: ANTERIOR LATERAL LUMBAR FUSION 4 LEVELS;  Surgeon: Maeola Harman, MD;  Location: [facility];  Service: Neurosurgery;  Laterality: Right;  Right L1-2 L2-3 L3-4 L4-5 Anterolateral decompression/fusion/with L5-S1 Posterior lumbar interbody fusion/Percutaneous pedicle screws L1-S1   BACK SURGERY     CATARACT EXTRACTION W/ INTRAOCULAR LENS  IMPLANT, BILATERAL Bilateral 2017   CORONARY ANGIOPLASTY  05/14/2018   CORONARY ANGIOPLASTY WITH STENT PLACEMENT     CORONARY BALLOON ANGIOPLASTY N/A 05/14/2018   Procedure: CORONARY BALLOON ANGIOPLASTY;  Surgeon: Runell Gess, MD;  Location: [facility];  Service: Cardiovascular;  Laterality: N/A;   CORONARY BALLOON ANGIOPLASTY N/A 04/25/2019    Procedure: CORONARY BALLOON ANGIOPLASTY;  Surgeon: Runell Gess, MD;  Location: [facility];  Service: Cardiovascular;  Laterality: N/A;  diagonal 1st   CORONARY BALLOON ANGIOPLASTY N/A 01/20/2022   Procedure: CORONARY BALLOON ANGIOPLASTY;  Surgeon: Runell Gess, MD;  Location: [facility];  Service: Cardiovascular;  Laterality: N/A;   CORONARY PRESSURE/FFR STUDY N/A 09/15/2016   Procedure: Intravascular Pressure Wire/FFR Study;  Surgeon: Rinaldo Cloud, MD;  Location: [facility] CV [facility];  Service: Cardiovascular;  Laterality: N/A;   CORONARY STENT INTERVENTION N/A 09/15/2016   Procedure: Coronary Stent Intervention;  Surgeon: Rinaldo Cloud, MD;  Location: [facility];  Service: Cardiovascular;  Laterality: N/A;   CORONARY STENT INTERVENTION N/A 05/14/2018   Procedure: CORONARY STENT INTERVENTION;  Surgeon: Runell Gess, MD;  Location: [facility];  Service: Cardiovascular;  Laterality: N/A;   FINGER AMPUTATION Right 2008   Index finger   LEFT HEART CATH AND CORONARY ANGIOGRAPHY N/A 09/15/2016   Procedure: Left Heart Cath and Coronary Angiography;  Surgeon: Rinaldo Cloud, MD;  Location: [facility] INV[facility] CV LAB;  Service: Cardiovascular;  Laterality: N/A;   LEFT HEART CATH AND CORONARY ANGIOGRAPHY N/A 05/14/2018   Procedure: LEFT HEART CATH AND CORONARY ANGIOGRAPHY;  Surgeon: Runell Gess, MD;  Location: [facility];  Service: Cardiovascular;  Laterality: N/A;   LEFT HEART CATH AND CORONARY ANGIOGRAPHY N/A 04/25/2019   Procedure: LEFT HEART CATH AND CORONARY ANGIOGRAPHY;  Surgeon: Runell Gess,  MD;  Location: [facility];  Service: Cardiovascular;  Laterality: N/A;   LEFT HEART CATH AND CORONARY ANGIOGRAPHY N/A 01/20/2022   Procedure: LEFT HEART CATH AND CORONARY ANGIOGRAPHY;  Surgeon: Runell Gess, MD;  Location: [facility];  Service: Cardiovascular;  Laterality: N/A;   RIGHT/LEFT HEART CATH AND CORONARY ANGIOGRAPHY N/A 01/20/2022    Procedure: RIGHT/LEFT HEART CATH AND CORONARY ANGIOGRAPHY;  Surgeon: Runell Gess, MD;  Location: [facility];  Service: Cardiovascular;  Laterality: N/A;   TONSILLECTOMY      Allergies  Allergies  Allergen Reactions   Codeine Anaphylaxis   Garlic Anaphylaxis   Morphine And Related Nausea And Vomiting   Atorvastatin Other (See Comments)    Muscle aches   Pravastatin Sodium Other (See Comments)    Muscle pain    History of Present Illness    [name] [name] is a PMH of coronary artery disease, HTN, HLD, and acute on chronic diastolic CHF.  She presented to the emergency department on 01/18/2022 and was discharged on 01/21/2022.  She complained of increased shortness of breath.  She was diagnosed with acute on chronic diastolic CHF.  It was felt that she was having anginal equivalent type chest discomfort.  She was admitted.  Cardiology was consulted.  Her echocardiogram showed normal LV function.  She underwent cardiac catheterization which showed 95% stenosed first diagonal, and mid LAD 60% stenosis.  She underwent PCTA and was noted to have 25% residual stenosis post dilation.  She was placed on clopidogrel and aspirin.  She presented to the clinic 02/10/22 for follow-up evaluation stated that she initially had an improvement in her breathing post cardiac catheterization and angioplasty.  At that time she reported that her work of breathing was increasing.  She noticed that she was able to do physical activity however her endurance was reduced and she noticed increased fatigue.  She also reported that her valsartan was discontinued while in the hospital due to low blood pressure.  She continued to notice low blood pressure at home.  We reviewed her echocardiogram and cardiac catheterization as well as her labs.  I will decreased her carvedilol to 12.5 mg during the day and continued her 25 mg in the evening.  She reported that she was doing physical therapy exercises 7 days/week.   Asked her to take a rest day through the week and slowly build her physical activity.  She had been taking her furosemide as needed.  We will plan follow-up in 2-3 months.  She was seen and evaluated by Dr. Allyson Sabal 05/03/2022.  She did note some dyspnea.  Her furosemide 40 mg daily for lower extremity edema was continued.  She was euvolemic.  Her blood pressure was well-controlled.  She presents to the clinic today for follow-up evaluation and states she continues to be fatigued and have shortness of breath with increased physical activity.  Her PCP referred her to neurology to diagnosed asthma.  She is now on albuterol and steroid inhalers.  She reports that her breathing has improved.  She has increased stress with her upcoming move.  She reports that her spouse passed away and she is in the process of packing.  She will be moving closer to her daughter.  I will order CBC, BMP, refill nitroglycerin, Plavix.  I will reach out to pharmacy about Repatha grant.  Will plan to repeat her fasting lipids and LFTs after she has been on Repatha for 3 months.  Will plan  follow-up in 9 to 12 months.  Today she denies chest pain,  lower extremity edema,  palpitations, melena, hematuria, hemoptysis, diaphoresis, weakness, presyncope, syncope, orthopnea, and PND.   Home Medications    Prior to Admission medications   Medication Sig Start Date End Date Taking? Authorizing Provider  acetaminophen (TYLENOL) 500 MG tablet Take 500-650 mg by mouth every 6 (six) hours as needed for headache or moderate pain.    [provider]  AIMOVIG 70 MG/ML SOAJ Inject 70 mg into the skin every 30 (thirty) days. 10/15/18   [provider]  albuterol (PROVENTIL HFA;VENTOLIN HFA) 108 (90 Base) MCG/ACT inhaler Inhale 2 puffs into the lungs 4 (four) times daily as needed for wheezing or shortness of breath. 07/26/13   [provider]  amLODipine (NORVASC) 5 MG tablet Take 1 tablet (5 mg total) by mouth daily.  01/21/22   Osvaldo Shipper, MD  aspirin EC 81 MG tablet Take 81 mg by mouth daily.    [provider]  carvedilol (COREG) 25 MG tablet Take 25 mg by mouth 2 (two) times daily with a meal.    [provider]  clopidogrel (PLAVIX) 75 MG tablet TAKE 1 TABLET BY MOUTH DAILY. Patient taking differently: Take 75 mg by mouth daily. 08/13/21   Runell Gess, MD  fluticasone (FLONASE) 50 MCG/ACT nasal spray Place 1-2 sprays into both nostrils daily as needed for allergies.    [provider]  furosemide (LASIX) 40 MG tablet Take 1 tablet (40 mg total) by mouth daily. 02/02/22   O'Neal, Ronnald Ramp, MD  levocetirizine (XYZAL) 5 MG tablet Take 5 mg by mouth every evening.    [provider]  nitroGLYCERIN (NITROSTAT) 0.4 MG SL tablet Place 1 tablet (0.4 mg total) under the tongue every 5 (five) minutes x 3 doses as needed. Patient taking differently: Place 0.4 mg under the tongue every 5 (five) minutes x 3 doses as needed for chest pain. 02/22/18   Runell Gess, MD  nortriptyline (PAMELOR) 25 MG capsule Take 25 mg by mouth 2 (two) times daily.    [provider]  pantoprazole (PROTONIX) 40 MG tablet Take 1 tablet (40 mg total) by mouth daily. 05/15/18   Arty Baumgartner, NP  potassium chloride SA (KLOR-CON M) 20 MEQ tablet Take 1 tablet (20 mEq total) by mouth daily. 02/02/22   O'Neal, Ronnald Ramp, MD  Propylene Glycol (SYSTANE COMPLETE OP) Place 1 drop into both eyes daily as needed (dry eyes).    [provider]  REPATHA SURECLICK 140 MG/ML SOAJ INJECT 140 MG INTO THE SKIN EVERY 14 DAYS. Patient taking differently: Inject 140 mg into the skin every 14 (fourteen) days. 10/21/21   Runell Gess, MD  sertraline (ZOLOFT) 100 MG tablet Take 100 mg by mouth daily. 10/19/21   [provider]  valsartan (DIOVAN) 320 MG tablet Take 1 tablet (320 mg total) by mouth daily. 08/18/21   Runell Gess, MD  Vitamin D, Ergocalciferol, (DRISDOL) 1.25 MG  (50000 UNIT) CAPS capsule Take 50,000 Units by mouth once a week. 07/07/21   [provider]    Family History    Family History  Problem Relation Age of Onset   Heart failure Mother    Cancer Mother 79       Breast   Kidney failure Father    Brain cancer Sister    She indicated that her mother is deceased. She indicated that her father is deceased. She indicated that  the status of her sister is unknown. She indicated that her maternal grandmother is deceased. She indicated that her maternal grandfather is deceased. She indicated that her paternal grandmother is deceased. She indicated that her paternal grandfather is deceased.  Social History    Social History   Socioeconomic History   Marital status: Widowed    Spouse name: Not on file   Number of children: Not on file   Years of education: Not on file   Highest education level: Not on file  Occupational History   Not on file  Tobacco Use   Smoking status: Never   Smokeless tobacco: Never  Vaping Use   Vaping Use: Never used  Substance and Sexual Activity   Alcohol use: No   Drug use: No   Sexual activity: Not on file  Other Topics Concern   Not on file  Social History Narrative   Not on file   Social Determinants of Health   Financial Resource Strain: Not on file  Food Insecurity: Not on file  Transportation Needs: Not on file  Physical Activity: Not on file  Stress: Not on file  Social Connections: Not on file  Intimate Partner Violence: Not on file     Review of Systems    General:  No chills, fever, night sweats or weight changes.  Cardiovascular:  No chest pain, dyspnea on exertion, edema, orthopnea, palpitations, paroxysmal nocturnal dyspnea. Dermatological: No rash, lesions/masses Respiratory: No cough, dyspnea Urologic: No hematuria, dysuria Abdominal:   No nausea, vomiting, diarrhea, bright red blood per rectum, melena, or hematemesis Neurologic:  No visual changes, wkns, changes in  mental status. All other systems reviewed and are otherwise negative except as noted above.  Physical Exam    VS:  BP 114/68   Pulse 69   Ht 5\' 5"  (1.651 m)   Wt 178 lb 3.2 oz (80.8 kg)   SpO2 93%   BMI 29.65 kg/m  , BMI Body mass index is 29.65 kg/m. GEN: Well nourished, well developed, in no acute distress. HEENT: normal. Neck: Supple, no JVD, carotid bruits, or masses. Cardiac: RRR, no murmurs, rubs, or gallops. No clubbing, cyanosis, edema.  Radials/DP/PT 2+ and equal bilaterally.  Respiratory:  Respirations regular and unlabored, clear to auscultation bilaterally. GI: Soft, nontender, nondistended, BS + x 4. MS: no deformity or atrophy. Skin: warm and dry, no rash. Neuro:  Strength and sensation are intact. Psych: Normal affect.  Accessory Clinical Findings    Recent Labs: 01/17/2022: ALT 15; B Natriuretic Peptide 115.8 01/21/2022: BUN 18; Creatinine, Ser 0.73; Hemoglobin 12.5; Platelets 264; Potassium 3.5; Sodium 138   Recent Lipid Panel    Component Value Date/Time   CHOL 133 01/21/2022 0253   CHOL 117 03/06/2020 0828   TRIG 185 (H) 01/21/2022 0253   HDL 42 01/21/2022 0253   HDL 54 03/06/2020 0828   CHOLHDL 3.2 01/21/2022 0253   VLDL 37 01/21/2022 0253   LDLCALC 54 01/21/2022 0253   LDLCALC 41 03/06/2020 0828    ECG personally reviewed by me today-normal sinus rhythm nonspecific T wave and ST abnormality 69 bpm voltage criteria for LVH  EKG 02/10/2022 sinus bradycardia 55 bpm- No acute changes  Echocardiogram 01/19/2022  IMPRESSIONS     1. Left ventricular ejection fraction, by estimation, is 60 to 65%. The  left ventricle has normal function. The left ventricle has no regional  wall motion abnormalities. Left ventricular diastolic parameters were  normal.   2. Right ventricular systolic function is normal. The  right ventricular  size is normal. There is normal pulmonary artery systolic pressure.   3. The mitral valve is normal in structure. No evidence of  mitral valve  regurgitation. No evidence of mitral stenosis.   4. The aortic valve is tricuspid. Aortic valve regurgitation is trivial.  No aortic stenosis is present.   5. The inferior vena cava is normal in size with greater than 50%  respiratory variability, suggesting right atrial pressure of 3 mmHg.  Cardiac catheterization 01/20/2022    Mid LAD lesion is 60% stenosed.   1st Diag lesion is 95% stenosed.   Non-stenotic Prox LAD to Mid LAD lesion was previously treated.   Balloon angioplasty was performed.   Post intervention, there is a 25% residual stenosis.   [name] [name] is a 77 y.o. female      [id number] LOCATION:  FACILITY: [facility]  PHYSICIAN: [name], M.D. 10-May-1946     DATE OF PROCEDURE:  01/20/2022  Diagnostic Dominance: Left  Intervention    Assessment & Plan   1. Chronic diastolic CHF-euvolemic .  Weight today 178.2 pounds.  Breathing stable.   Continue losartan, carvedilol Will provide wrist of iron rich foods Heart healthy low-sodium diet Increase physical activity as tolerated CBC, BMP   Coronary artery disease-no chest pain today.  With cardiac catheterization with balloon angioplasty on 01/20/2022.  Details above. Continue aspirin, clopidogrel,  nitroglycerin Heart healthy low-sodium diet-reviewed Increase physical activity as tolerated  Hyperlipidemia-LDL 54 on7/7/23 Continue aspirin  Repatha- hasn't taken this year due to cost-will reach out to pharm. Previously had grant assistance.  Heart healthy low-sodium high-fiber diet Increase physical activity as tolerated Plan to repeat fasting lipids and LFTs once she has resumed Repatha x 3 months.  Essential hypertension-BP today 114/68 Continue losartan, carvedilol,  Heart healthy low-sodium diet-salty 6 given Increase physical activity as tolerated  Disposition: Follow-up with Dr. Allyson Sabal in 9-12 months.   Thomasene Ripple. Tesia Lybrand NP-C     11/24/2022, 12:00 PM [facility]  [facility] [address] Office [phone] Fax [phone]  Notice: This dictation was prepared with Dragon dictation along with smaller phrase technology. Any transcriptional errors that result from this process are unintentional and may not be corrected upon review.  I spent 15 minutes examining this patient, reviewing medications, and using patient centered shared decision making involving her cardiac care.  Prior to her visit I spent greater than 20 minutes reviewing her past medical history,  medications, and prior cardiac tests.

## 2022-11-24 ENCOUNTER — Ambulatory Visit: Payer: Medicare Other | Attending: General Practice | Admitting: General Practice

## 2022-11-24 ENCOUNTER — Telehealth: Payer: Self-pay

## 2022-11-24 ENCOUNTER — Encounter: Payer: Self-pay | Admitting: Pharmacist

## 2022-11-24 ENCOUNTER — Encounter: Payer: Self-pay | Admitting: General Practice

## 2022-11-24 VITALS — BP 114/68 | HR 69 | Ht 65.0 in | Wt 178.2 lb

## 2022-11-24 DIAGNOSIS — I5032 Chronic diastolic (congestive) heart failure: Secondary | ICD-10-CM | POA: Diagnosis not present

## 2022-11-24 DIAGNOSIS — I2510 Atherosclerotic heart disease of native coronary artery without angina pectoris: Secondary | ICD-10-CM | POA: Diagnosis not present

## 2022-11-24 DIAGNOSIS — Z9861 Coronary angioplasty status: Secondary | ICD-10-CM

## 2022-11-24 DIAGNOSIS — I10 Essential (primary) hypertension: Secondary | ICD-10-CM

## 2022-11-24 DIAGNOSIS — E782 Mixed hyperlipidemia: Secondary | ICD-10-CM

## 2022-11-24 MED ORDER — NITROGLYCERIN 0.4 MG SL SUBL: 0.4000 mg | SUBLINGUAL_TABLET | SUBLINGUAL | 2 refills | Status: AC | PRN

## 2022-11-24 MED ORDER — FUROSEMIDE 40 MG PO TABS: 40.0000 mg | ORAL_TABLET | ORAL | 1 refills | Status: AC

## 2022-11-24 MED ORDER — CLOPIDOGREL BISULFATE 75 MG PO TABS: 75.0000 mg | ORAL_TABLET | Freq: Every day | ORAL | 3 refills | Status: DC

## 2022-11-24 NOTE — Addendum Note (Signed)
Addended by: Alyson Ingles on: 11/24/2022 02:41 PM   Modules accepted: Orders

## 2022-11-24 NOTE — Telephone Encounter (Signed)
CALLED AND REMINDED LASIX. EVER-OTHER-DAY. VERBALIZED UNDERSTANDING.

## 2022-11-24 NOTE — Patient Instructions (Addendum)
Medication Instructions:  I WILL ASK ABOUT REPATHA  TAKE LASIX EVERY-OTHER-DAY *If you need a refill on your cardiac medications before your next appointment, please call your pharmacy*  Lab Work: CBC, BMET If you have labs (blood work) drawn today and your tests are completely normal, you will receive your results only by:  MyChart Message (if you have MyChart) OR  A paper copy in the mail If you have any lab test that is abnormal or we need to change your treatment, we will call you to review the results.  Follow-Up: At [facility], you and your health needs are our priority.  As part of our continuing mission to provide you with exceptional heart care, we have created designated Provider Care Teams.  These Care Teams include your primary Cardiologist (physician) and Advanced Practice Providers (APPs -  Physician Assistants and Nurse Practitioners) who all work together to provide you with the care you need, when you need it.  Your next appointment:   9-12 month(s)  Provider:   [name], MD     Other Instructions Iron-Rich Diet  Iron is a mineral that helps your body produce hemoglobin. Hemoglobin is a protein in red blood cells that carries oxygen to your body's tissues. Eating too little iron may cause you to feel weak and tired, and it can increase your risk of infection. Iron is naturally found in many foods, and many foods have iron added to them (are iron-fortified). You may need to follow an iron-rich diet if you do not have enough iron in your body due to certain medical conditions. The amount of iron that you need each day depends on your age, your sex, and any medical conditions you have. Follow instructions from your health care provider or a dietitian about how much iron you should eat each day. What are tips for following this plan? Reading food labels Check food labels to see how many milligrams (mg) of iron are in each serving. Cooking Cook foods in pots  and pans that are made from iron. Take these steps to make it easier for your body to absorb iron from certain foods: Soak beans overnight before cooking. Soak whole grains overnight and drain them before using. Ferment flours before baking, such as by using yeast in bread dough. Meal planning When you eat foods that contain iron, you should eat them with foods that are high in vitamin C. These include oranges, peppers, tomatoes, potatoes, and mangoes. Vitamin C helps your body absorb iron. Certain foods and drinks prevent your body from absorbing iron properly. Avoid eating these foods in the same meal as iron-rich foods or with iron supplements. These foods include: Coffee, black tea, and red wine. Milk, dairy products, and foods that are high in calcium. Beans and soybeans. Whole grains. General information Take iron supplements only as told by your health care provider. An overdose of iron can be life-threatening. If you were prescribed iron supplements, take them with orange juice or a vitamin C supplement. When you eat iron-fortified foods or take an iron supplement, you should also eat foods that naturally contain iron, such as meat, poultry, and fish. Eating naturally iron-rich foods helps your body absorb the iron that is added to other foods or contained in a supplement. Iron from animal sources is better absorbed than iron from plant sources. What foods should I eat? Fruits Prunes. Raisins. Eat fruits high in vitamin C, such as oranges, grapefruits, and strawberries, with iron-rich foods. Vegetables Spinach (  cooked). Green peas. Broccoli. Fermented vegetables. Eat vegetables high in vitamin C, such as leafy greens, potatoes, bell peppers, and tomatoes, with iron-rich foods. Grains Iron-fortified breakfast cereal. Iron-fortified whole-wheat bread. Enriched rice. Sprouted grains. Meats and other proteins Beef liver. Beef. [place]. Chicken. Oysters. Shrimp. Tuna. Sardines. Chickpeas.  Nuts. Tofu. Pumpkin seeds. Beverages Tomato juice. Fresh orange juice. Prune juice. Hibiscus tea. Iron-fortified instant breakfast shakes. Sweets and desserts Blackstrap molasses. Seasonings and condiments Tahini. Fermented soy sauce. Other foods Wheat germ. The items listed above may not be a complete list of recommended foods and beverages. Contact a dietitian for more information. What foods should I limit? These are foods that should be limited while eating iron-rich foods as they can reduce the absorption of iron in your body. Grains Whole grains. Bran cereal. Bran flour. Meats and other proteins Soybeans. Products made from soy protein. Black beans. Lentils. Mung beans. Split peas. Dairy Milk. Cream. Cheese. Yogurt. Cottage cheese. Beverages Coffee. Black tea. Red wine. Sweets and desserts Cocoa. Chocolate. Ice cream. Seasonings and condiments Basil. Oregano. Large amounts of parsley. The items listed above may not be a complete list of foods and beverages you should limit. Contact a dietitian for more information. Summary Iron is a mineral that helps your body produce hemoglobin. Hemoglobin is a protein in red blood cells that carries oxygen to your body's tissues. Iron is naturally found in many foods, and many foods have iron added to them (are iron-fortified). When you eat foods that contain iron, you should eat them with foods that are high in vitamin C. Vitamin C helps your body absorb iron. Certain foods and drinks prevent your body from absorbing iron properly, such as whole grains and dairy products. You should avoid eating these foods in the same meal as iron-rich foods or with iron supplements. This information is not intended to replace advice given to you by your health care provider. Make sure you discuss any questions you have with your health care provider. Document Revised: 06/15/2020 Document Reviewed: 06/15/2020 Elsevier Patient Education  2023 [employer].

## 2022-11-25 LAB — CBC
Hematocrit: 39.1 % (ref 34.0–46.6)
Hemoglobin: 12.6 g/dL (ref 11.1–15.9)
MCH: 28.3 pg (ref 26.6–33.0)
MCHC: 32.2 g/dL (ref 31.5–35.7)
MCV: 88 fL (ref 79–97)
Platelets: 293 10*3/uL (ref 150–450)
RBC: 4.46 x10E6/uL (ref 3.77–5.28)
RDW: 13.6 % (ref 11.7–15.4)
WBC: 6 10*3/uL (ref 3.4–10.8)

## 2022-11-25 LAB — BASIC METABOLIC PANEL
BUN/Creatinine Ratio: 13 (ref 12–28)
BUN: 13 mg/dL (ref 8–27)
CO2: 25 mmol/L (ref 20–29)
Calcium: 10.2 mg/dL (ref 8.7–10.3)
Chloride: 102 mmol/L (ref 96–106)
Creatinine, Ser: 0.98 mg/dL (ref 0.57–1.00)
Glucose: 102 mg/dL — ABNORMAL HIGH (ref 70–99)
Potassium: 4.8 mmol/L (ref 3.5–5.2)
Sodium: 140 mmol/L (ref 134–144)
eGFR: 59 mL/min/{1.73_m2} — ABNORMAL LOW (ref >59)

## 2022-12-14 NOTE — Telephone Encounter (Signed)
error 

## 2022-12-20 ENCOUNTER — Other Ambulatory Visit: Payer: Self-pay | Admitting: Cardiovascular Disease

## 2022-12-21 ENCOUNTER — Encounter (HOSPITAL_BASED_OUTPATIENT_CLINIC_OR_DEPARTMENT_OTHER): Payer: Self-pay

## 2022-12-29 ENCOUNTER — Ambulatory Visit (INDEPENDENT_AMBULATORY_CARE_PROVIDER_SITE_OTHER): Payer: Medicare Other | Admitting: Pulmonary Disease

## 2022-12-29 DIAGNOSIS — R0609 Other forms of dyspnea: Secondary | ICD-10-CM | POA: Diagnosis not present

## 2022-12-29 LAB — PULMONARY FUNCTION TEST
DL/VA % pred: 68 %
DL/VA: 2.75 ml/min/mmHg/L
DLCO cor % pred: 35 %
DLCO cor: 7.04 ml/min/mmHg
DLCO unc % pred: 34 %
DLCO unc: 6.86 ml/min/mmHg
FEF 25-75 Post: 2.11 L/sec
FEF 25-75 Pre: 2.14 L/sec
FEF2575-%Change-Post: -1 %
FEF2575-%Pred-Post: 128 %
FEF2575-%Pred-Pre: 129 %
FEV1-%Change-Post: 0 %
FEV1-%Pred-Post: 84 %
FEV1-%Pred-Pre: 83 %
FEV1-Post: 1.84 L
FEV1-Pre: 1.84 L
FEV1FVC-%Change-Post: 2 %
FEV1FVC-%Pred-Pre: 109 %
FEV6-%Change-Post: -2 %
FEV6-%Pred-Post: 78 %
FEV6-%Pred-Pre: 80 %
FEV6-Post: 2.18 L
FEV6-Pre: 2.24 L
FEV6FVC-%Pred-Post: 105 %
FEV6FVC-%Pred-Pre: 105 %
FVC-%Change-Post: -2 %
FVC-%Pred-Post: 74 %
FVC-%Pred-Pre: 76 %
FVC-Post: 2.18 L
FVC-Pre: 2.24 L
Post FEV1/FVC ratio: 84 %
Post FEV6/FVC ratio: 100 %
Pre FEV1/FVC ratio: 82 %
Pre FEV6/FVC Ratio: 100 %
RV % pred: 65 %
RV: 1.58 L
TLC % pred: 68 %
TLC: 3.61 L

## 2022-12-29 NOTE — Patient Instructions (Signed)
Full PFT performed today. °

## 2022-12-29 NOTE — Progress Notes (Signed)
Full PFT performed today. °

## 2022-12-30 ENCOUNTER — Telehealth: Payer: Self-pay | Admitting: Pulmonary Disease

## 2022-12-30 ENCOUNTER — Ambulatory Visit: Payer: Medicare Other | Admitting: Pulmonary Disease

## 2022-12-30 ENCOUNTER — Encounter (HOSPITAL_BASED_OUTPATIENT_CLINIC_OR_DEPARTMENT_OTHER): Payer: Medicare Other

## 2022-12-30 ENCOUNTER — Encounter: Payer: Self-pay | Admitting: Pulmonary Disease

## 2022-12-30 VITALS — BP 124/72 | HR 67 | Temp 97.5°F | Ht 65.0 in | Wt 175.0 lb

## 2022-12-30 DIAGNOSIS — R942 Abnormal results of pulmonary function studies: Secondary | ICD-10-CM

## 2022-12-30 NOTE — Progress Notes (Signed)
@Patient  ID: [name] [name], female    DOB: [birth year], 77 y.o.   MRN: [id number]  Chief Complaint  Patient presents with   Follow-up    PFT 12/29/22 Breathing better but not ok .    Referring provider: Tracey Harries, MD  HPI:   77 y.o. woman whom we are seeing for evaluation of dyspnea on exertion.    Prescribed Advair last visit.  She is been without this for couple weeks.  Breathing worsens off of it.  Albuterol seems to help some.  Even on the Advair her symptoms are not adequately controlled per her report.  In interim since last visit she had PFT, see below for interpretation but concern for ILD with restriction and decreased DLCO.  We discussed results in details and neck steps as well as not expected to escalate therapy for possible underlying asthma given improvement with Advair and albuterol.   PI at initial visit: Patient additional exertion going on about a year now.  Presented to her PCP for evaluation.  Underwent left heart catheterization and POBA to first diagonal with 95% stenosed also with a 60% mid LAD lesion.  She got no benefit from this.  This led to CTA PE protocol that showed no PE but did demonstrate  left lower lobe 8 mm 01/2022.  Otherwise clear parenchyma on my review interpretation.  Repeat CT scan 08/2022 via [facility], images not available to me, demonstrates reported mild bronchiectasis, 7 mm partially calcified nodule in the left lower lobe previously described as noncalcified, small 4 mm fissure right lower lobe nodule not described on prior imaging.  Chest history of asthma.  On inhalers in the past.  Has albuterol at home for exposure to the garlic.  Has not used in years.  It is expired.  In the past she had more cough when her asthma was poorly controlled.  She does not recall if she used back then.  This was years ago.  Initially denied much cough.  Then described coughing fits with deep breathing.  Some coughing on exam with deep breath as  well.    Questionaires / Pulmonary Flowsheets:   ACT:      No data to display          MMRC:     No data to display          Epworth:      No data to display          Tests:   FENO:  No results found for: "NITRICOXIDE"  PFT:    Latest Ref Rng & Units 12/29/2022    3:40 PM  PFT Results  FVC-Pre L 2.24   FVC-Predicted Pre % 76   FVC-Post L 2.18   FVC-Predicted Post % 74   Pre FEV1/FVC % % 82   Post FEV1/FCV % % 84   FEV1-Pre L 1.84   FEV1-Predicted Pre % 83   FEV1-Post L 1.84   DLCO uncorrected ml/min/mmHg 6.86   DLCO UNC% % 34   DLCO corrected ml/min/mmHg 7.04   DLCO COR %Predicted % 35   DLVA Predicted % 68   TLC L 3.61   TLC % Predicted % 68   RV % Predicted % 65   Personally reviewed interpreted spirometry suggestive of mild restriction versus air trapping.  Moderate restriction confirmed on lung volumes via TLC.  DLCO severely reduced.  WALK:      No data to display  Imaging: Personally reviewed and as per EMR and discussion in this note No results found.  Lab Results: Personally reviewed CBC    Component Value Date/Time   WBC 6.0 11/24/2022 1223   WBC 8.5 01/21/2022 0253   RBC 4.46 11/24/2022 1223   RBC 4.37 01/21/2022 0253   HGB 12.6 11/24/2022 1223   HCT 39.1 11/24/2022 1223   PLT 293 11/24/2022 1223   MCV 88 11/24/2022 1223   MCH 28.3 11/24/2022 1223   MCH 28.6 01/21/2022 0253   MCHC 32.2 11/24/2022 1223   MCHC 33.2 01/21/2022 0253   RDW 13.6 11/24/2022 1223   LYMPHSABS 1.4 01/17/2022 1434   MONOABS 0.4 01/17/2022 1434   EOSABS 0.3 01/17/2022 1434   BASOSABS 0.0 01/17/2022 1434    BMET    Component Value Date/Time   NA 140 11/24/2022 1223   K 4.8 11/24/2022 1223   CL 102 11/24/2022 1223   CO2 25 11/24/2022 1223   GLUCOSE 102 (H) 11/24/2022 1223   GLUCOSE 90 01/21/2022 0253   BUN 13 11/24/2022 1223   CREATININE 0.98 11/24/2022 1223   CALCIUM 10.2 11/24/2022 1223   GFRNONAA >60 01/21/2022 0253    GFRAA >60 04/26/2019 0438    BNP    Component Value Date/Time   BNP 115.8 (H) 01/17/2022 1434    ProBNP No results found for: "PROBNP"  Specialty Problems       Pulmonary Problems   Allergic rhinitis   Extrinsic asthma   DOE (dyspnea on exertion)    Dyspnea on exertion       Allergies  Allergen Reactions   Codeine Anaphylaxis   Garlic Anaphylaxis   Morphine And Codeine Nausea And Vomiting   Atorvastatin Other (See Comments)    Muscle aches   Pravastatin Sodium Other (See Comments)    Muscle pain    Immunization History  Administered Date(s) Administered   Fluad Quad(high Dose 65+) 06/03/2013   Influenza Split 06/19/2004, 07/07/2009, 06/28/2010, 06/21/2011, 05/14/2012   Influenza, High Dose Seasonal PF 08/12/2014, 08/12/2014, 04/29/2015, 04/04/2016, 05/08/2019   Influenza,inj,Quad PF,6+ Mos 04/29/2015, 04/04/2016   Influenza-Unspecified 07/07/2009, 07/07/2009, 06/28/2010, 06/28/2010, 06/21/2011, 06/21/2011, 05/14/2012, 05/14/2012, 06/03/2013, 04/29/2015, 04/04/2016, 04/30/2018   PFIZER Comirnaty(Gray Top)Covid-19 Tri-Sucrose Vaccine 08/10/2020   PFIZER(Purple Top)SARS-COV-2 Vaccination 08/23/2019, 09/13/2019, 08/10/2020   Pneumococcal Conjugate-13 07/26/2013   Pneumococcal Polysaccharide-23 05/14/2012, 05/14/2012   Zoster, Live 08/02/2013    Past Medical History:  Diagnosis Date   Arthritis    "knees, hips, hands, back" (09/15/2016)   Constipation    Coronary artery disease    NSTEMI-PCI March 2018   Exercise-induced asthma    Family history of adverse reaction to anesthesia    "daughter gets PONV too"   GERD (gastroesophageal reflux disease)    Heart murmur dx'd 09/14/2016   High cholesterol    Hypertension    Migraine    "recently had series of injections; none since; they had been weekly 1 month ago" (09/15/2016)   Necrotizing fasciitis (HCC)    R index finger- had a amp   NSTEMI (non-ST elevated myocardial infarction) (HCC)    Pneumonia ~ 2003   PONV  (postoperative nausea and vomiting)    Patch helps   RSD (reflex sympathetic dystrophy)    Right arm after surgery    Tobacco History: Social History   Tobacco Use  Smoking Status Never  Smokeless Tobacco Never   Counseling given: Not Answered   Continue to not smoke  Outpatient Encounter Medications as of 12/30/2022  Medication Sig  acetaminophen (TYLENOL) 500 MG tablet Take 500-650 mg by mouth every 6 (six) hours as needed for headache or moderate pain.   AIMOVIG 70 MG/ML SOAJ Inject 70 mg into the skin every 30 (thirty) days.   albuterol (VENTOLIN HFA) 108 (90 Base) MCG/ACT inhaler Inhale 2 puffs into the lungs every 6 (six) hours as needed for wheezing or shortness of breath.   aspirin EC 81 MG tablet Take 81 mg by mouth daily.   carvedilol (COREG) 25 MG tablet Take by mouth.   clopidogrel (PLAVIX) 75 MG tablet Take 1 tablet (75 mg total) by mouth daily.   cyanocobalamin (VITAMIN B12) 1000 MCG/ML injection Inject 1,000 mcg into the skin every 30 (thirty) days.   fluticasone (FLONASE) 50 MCG/ACT nasal spray Place 1-2 sprays into both nostrils daily as needed for allergies.   fluticasone-salmeterol (ADVAIR) 250-50 MCG/ACT AEPB Inhale 1 puff into the lungs in the morning and at bedtime.   furosemide (LASIX) 40 MG tablet Take 1 tablet (40 mg total) by mouth every other day.   levocetirizine (XYZAL) 5 MG tablet Take 5 mg by mouth every evening.   montelukast (SINGULAIR) 10 MG tablet Take 10 mg by mouth daily.   nitroGLYCERIN (NITROSTAT) 0.4 MG SL tablet Place 1 tablet (0.4 mg total) under the tongue every 5 (five) minutes x 3 doses as needed.   nortriptyline (PAMELOR) 25 MG capsule Take 25 mg by mouth 2 (two) times daily.   pantoprazole (PROTONIX) 40 MG tablet Take 1 tablet (40 mg total) by mouth daily.   potassium chloride SA (KLOR-CON M) 20 MEQ tablet TAKE 1 TABLET BY MOUTH ONCE DAILY.   Propylene Glycol (SYSTANE COMPLETE OP) Place 1 drop into both eyes daily as needed (dry  eyes).   REPATHA SURECLICK 140 MG/ML SOAJ INJECT 140 MG INTO THE SKIN EVERY 14 DAYS. (Patient taking differently: Inject 140 mg into the skin every 14 (fourteen) days.)   sertraline (ZOLOFT) 100 MG tablet Take 100 mg by mouth daily.   Vitamin D, Ergocalciferol, (DRISDOL) 1.25 MG (50000 UNIT) CAPS capsule Take 50,000 Units by mouth once a week.   ticagrelor (BRILINTA) 90 MG TABS tablet Take 90 mg by mouth 2 (two) times daily. (Patient not taking: Reported on 12/30/2022)   No facility-administered encounter medications on file as of 12/30/2022.     Review of Systems  Review of Systems  N/a Physical Exam  BP 124/72 (BP Location: Left Arm, Patient Position: Sitting, Cuff Size: Normal)   Pulse 67   Temp (!) 97.5 F (36.4 C) (Temporal)   Ht 5\' 5"  (1.651 m)   Wt 175 lb (79.4 kg)   SpO2 98%   BMI 29.12 kg/m   Wt Readings from Last 5 Encounters:  12/30/22 175 lb (79.4 kg)  11/24/22 178 lb 3.2 oz (80.8 kg)  11/15/22 180 lb 6.4 oz (81.8 kg)  05/03/22 179 lb 12.8 oz (81.6 kg)  02/10/22 173 lb 6.4 oz (78.7 kg)    BMI Readings from Last 5 Encounters:  12/30/22 29.12 kg/m  11/24/22 29.65 kg/m  11/15/22 30.02 kg/m  05/03/22 29.92 kg/m  02/10/22 28.86 kg/m     Physical Exam General: Sitting in chair, no acute distress Eyes: EOMI, no icterus Neck: Supple, no JVP Pulmonary: Clear, normal work of breathing Cardiovascular: Warm, no edema MSK: No synovitis, no joint effusion Neuro: Normal gait, no weakness Psych: Normal mood, full affect   Assessment & Plan:   Dyspnea on exertion: Present for over a year.  Not improved with  antibiotic.  History of asthma in the past.  Possible reemergence of asthma.  Some cough with deep breathing, concern for bronchospasm.  Chest imaging clear in past.   Trial Advair mid dose Diskus 1 puff twice daily with mild improvement.  Albuterol as needed.  Cardiac etiology is considered, she describes no improvement in symptoms despite POBA 01/2022 whereas  in the past she has benefit very much so from stent placement. Lastly, PFTs concerning or ILD despite none seen fall 2023 CT. Hi Res CT ordered.   Asthma: clinical improvement with Advair and albuterol. Not satisfactorily controlled. Trial Trelegy high dose.  Lung nodule: Scattered multiple, largest left lower lobe 01/2022.  Stable on 63-month scan via [facility].  Plan for scan 08/2023 for 63-month follow-up per Fleischner criteria. Will also compare Hi res CT ordered.   Return in about 3 months (around 04/01/2023).   Karren Burly, MD 12/30/2022   This appointment required 40 minutes of patient care (this includes precharting, chart review, review of results, face-to-face care, etc.).

## 2022-12-30 NOTE — Telephone Encounter (Signed)
Patient on Advair with clinical benefit.  Co-pay $50, too expensive.  In addition, symptoms not adequately controlled.  Can we explore most cost effective ICS/LABA therapy as well as most cost effective ICS/LAMA/LABA therapy as  ideally should be on triple inhaled therapy given lack of controlled symptoms on dual therapy?  Thank you!

## 2022-12-30 NOTE — Patient Instructions (Addendum)
Try new inhaler Trelegy 1 puff once a day, rinse mouth after every use  Try using albuterol 2 puffs every 4-6 hours as needed for shortness of breath, consider using before more strenuous activities  Patient had pulmonary function test I ordered a high-resolution CT scan of the chest to further evaluate possible scarring of the lung, none was present in the fall 2023 so I would be surprised if is there now but is worth evaluating given your pulmonary function test  Return to clinic in 3 months or sooner as needed with Dr. Judeth Horn

## 2023-01-02 ENCOUNTER — Other Ambulatory Visit (HOSPITAL_COMMUNITY): Payer: Self-pay

## 2023-01-02 NOTE — Telephone Encounter (Signed)
Per test claims at this time:   ICS/LABA/LAMA Trelegy-$47.00 Bevespi-$47.00  ICS/LABA Breo-$47.00 Dulera-$100.00 Generic Advair Diskus-$47.00 Advair HFA-$47.00 Brand Symbicort-$47.00

## 2023-01-09 ENCOUNTER — Other Ambulatory Visit (HOSPITAL_BASED_OUTPATIENT_CLINIC_OR_DEPARTMENT_OTHER): Payer: Medicare Other

## 2023-01-11 ENCOUNTER — Ambulatory Visit (HOSPITAL_BASED_OUTPATIENT_CLINIC_OR_DEPARTMENT_OTHER)
Admission: RE | Admit: 2023-01-11 | Discharge: 2023-01-11 | Disposition: A | Discharge status: Home / Self Care | Payer: Medicare Other | Source: Ambulatory Visit | Attending: Pulmonary Disease | Admitting: Pulmonary Disease

## 2023-01-11 DIAGNOSIS — R942 Abnormal results of pulmonary function studies: Secondary | ICD-10-CM | POA: Diagnosis present

## 2023-01-20 ENCOUNTER — Encounter: Payer: Self-pay | Admitting: Pulmonary Disease

## 2023-01-22 NOTE — Progress Notes (Signed)
CT scan shows no signs of scarring or interstitial lung disease which is great news.  Previously seen small nodule is unchanged.  Good news.

## 2023-02-02 ENCOUNTER — Encounter (HOSPITAL_BASED_OUTPATIENT_CLINIC_OR_DEPARTMENT_OTHER): Payer: Medicare Other

## 2023-02-17 ENCOUNTER — Encounter: Payer: Self-pay | Admitting: Nurse Practitioner

## 2023-02-17 ENCOUNTER — Ambulatory Visit: Payer: Medicare Other | Admitting: Nurse Practitioner

## 2023-02-17 VITALS — BP 144/78 | HR 62 | Ht 65.0 in | Wt 178.0 lb

## 2023-02-17 DIAGNOSIS — R918 Other nonspecific abnormal finding of lung field: Secondary | ICD-10-CM

## 2023-02-17 DIAGNOSIS — R942 Abnormal results of pulmonary function studies: Secondary | ICD-10-CM | POA: Diagnosis not present

## 2023-02-17 DIAGNOSIS — G4733 Obstructive sleep apnea (adult) (pediatric): Secondary | ICD-10-CM

## 2023-02-17 DIAGNOSIS — R0609 Other forms of dyspnea: Secondary | ICD-10-CM | POA: Diagnosis not present

## 2023-02-17 DIAGNOSIS — I5032 Chronic diastolic (congestive) heart failure: Secondary | ICD-10-CM

## 2023-02-17 DIAGNOSIS — J4540 Moderate persistent asthma, uncomplicated: Secondary | ICD-10-CM

## 2023-02-17 LAB — CBC WITH DIFFERENTIAL/PLATELET
Basophils Absolute: 0 10*3/uL (ref 0.0–0.1)
Basophils Relative: 0.8 % (ref 0.0–3.0)
Eosinophils Absolute: 0.2 10*3/uL (ref 0.0–0.7)
Eosinophils Relative: 3.9 % (ref 0.0–5.0)
HCT: 39 % (ref 36.0–46.0)
Hemoglobin: 12.9 g/dL (ref 12.0–15.0)
Lymphocytes Relative: 30 % (ref 12.0–46.0)
Lymphs Abs: 1.8 10*3/uL (ref 0.7–4.0)
MCHC: 33.1 g/dL (ref 30.0–36.0)
MCV: 85.2 fl (ref 78.0–100.0)
Monocytes Absolute: 0.4 10*3/uL (ref 0.1–1.0)
Monocytes Relative: 7.5 % (ref 3.0–12.0)
Neutro Abs: 3.4 10*3/uL (ref 1.4–7.7)
Neutrophils Relative %: 57.8 % (ref 43.0–77.0)
Platelets: 325 10*3/uL (ref 150.0–400.0)
RBC: 4.58 Mil/uL (ref 3.87–5.11)
RDW: 14.5 % (ref 11.5–15.5)
WBC: 5.8 10*3/uL (ref 4.0–10.5)

## 2023-02-17 LAB — BRAIN NATRIURETIC PEPTIDE: Pro B Natriuretic peptide (BNP): 167 pg/mL — ABNORMAL HIGH (ref 0.0–100.0)

## 2023-02-17 LAB — BASIC METABOLIC PANEL
BUN: 9 mg/dL (ref 6–23)
CO2: 28 mEq/L (ref 19–32)
Calcium: 10.5 mg/dL (ref 8.4–10.5)
Chloride: 106 mEq/L (ref 96–112)
Creatinine, Ser: 0.8 mg/dL (ref 0.40–1.20)
GFR: 71.06 mL/min (ref >60.00)
Glucose, Bld: 103 mg/dL — ABNORMAL HIGH (ref 70–99)
Potassium: 4.5 mEq/L (ref 3.5–5.1)
Sodium: 140 mEq/L (ref 135–145)

## 2023-02-17 NOTE — Assessment & Plan Note (Signed)
Stable, likely benign.  Will obtain CT chest in 1 year to document 2 years of stability.

## 2023-02-17 NOTE — Patient Instructions (Addendum)
Continue Albuterol inhaler 2 puffs every 6 hours as needed for shortness of breath or wheezing. Notify if symptoms persist despite rescue inhaler/neb use.  Restart Advair 1 puff Twice daily. Brush tongue and rinse mouth afterwards. See how this helps. If you have even minimal improvement, you can stay on this Continue xyzal 1 tab daily Continue lasix 1 tab daily as needed for weight gain/swelling Continue singulair 1 tab daily Continue CPAP nightly  Your CT chest showed an enlarged pulmonary artery, which is concerning for pulmonary hypertension, especially with your history of moderately severe sleep apnea and CHF. I have ordered an echocardiogram and labs to further evaluation. If echo is inconclusive or shows evidence of elevated pulmonary pressures and/or right heart strain, we will have you undergo right heart catheterization. If echo and labs are normal, we will schedule you for exercise stress testing.  Labs today   Walk test today without any low oxygen levels   Overnight oxygen study on CPAP - someone will contact you to schedule this  Discuss question of cirrhosis on CT imaging with your primary care provider and determine if further workup necessary  CT chest in one year to ensure nodules are stable.   Follow up as scheduled with Dr. Judeth Horn. If symptoms do not improve or worsen, please contact office for sooner follow up or seek emergency care.

## 2023-02-17 NOTE — Assessment & Plan Note (Signed)
Managed at [facility].  On CPAP nightly.  Receives benefit from use.  Compliant per her report.  See above plan.

## 2023-02-17 NOTE — Assessment & Plan Note (Signed)
Etiology not entirely clear.  She has had ongoing symptoms for around a year.  No significant improvement with inhaler therapies.  She had pulmonary function testing which showed a moderate restriction and diffusing defect.  She had high-resolution CT scan which was negative for interstitial lung disease.  She did have some evidence of enlarged pulmonary artery and heart, raising concern for possible underlying pulmonary hypertension.  She does have a history of moderately severe sleep apnea, diagnosed around a year and a half ago.  She does wear CPAP nightly.  Will arrange for her to have repeat echocardiogram and BNP.  If evidence of right heart strain, elevated pulmonary artery pressures or if inconclusive, will move forward with right heart catheterization for further evaluation.  If unremarkable, will consider cardiopulmonary exercise testing for further evaluation.  Walk test today without desaturations.  Will have her complete Ono on CPAP to ensure she is not having nocturnal desaturations.  Will also check a CBC with differential to ensure that she is not have an anemia that could be causing the diffusing capacity defect.  Patient Instructions  Continue Albuterol inhaler 2 puffs every 6 hours as needed for shortness of breath or wheezing. Notify if symptoms persist despite rescue inhaler/neb use.  Restart Advair 1 puff Twice daily. Brush tongue and rinse mouth afterwards. See how this helps. If you have even minimal improvement, you can stay on this Continue xyzal 1 tab daily Continue lasix 1 tab daily as needed for weight gain/swelling Continue singulair 1 tab daily Continue CPAP nightly  Your CT chest showed an enlarged pulmonary artery, which is concerning for pulmonary hypertension, especially with your history of moderately severe sleep apnea and CHF. I have ordered an echocardiogram and labs to further evaluation. If echo is inconclusive or shows evidence of elevated pulmonary pressures  and/or right heart strain, we will have you undergo right heart catheterization. If echo and labs are normal, we will schedule you for exercise stress testing.  Labs today   Walk test today without any low oxygen levels   Overnight oxygen study on CPAP - someone will contact you to schedule this  Discuss question of cirrhosis on CT imaging with your primary care provider and determine if further workup necessary  CT chest in one year to ensure nodules are stable.   Follow up as scheduled with Dr. Judeth Horn. If symptoms do not improve or worsen, please contact office for sooner follow up or seek emergency care.

## 2023-02-17 NOTE — Assessment & Plan Note (Signed)
Does have some response to SABA.  She has questioned if she improves with Advair in the past.  Will have her resume this to see if she has any improved benefit.  We did discuss that it may not entirely resolve her symptoms if there is another underlying factor.  She is willing to restart this.  Action plan in place.

## 2023-02-17 NOTE — Progress Notes (Signed)
@Patient  ID: [name] [name], female    DOB: 1945/08/12, 77 y.o.   MRN: [id number]  Chief Complaint  Patient presents with   Follow-up    F/u ct 01/11/23    Referring provider: Tracey Harries, MD  HPI: 77 year old female, never smoker followed for DOE and decreased diffusing capacity. She is a patient of Dr. Laurena Spies and last seen in office 12/30/2022. Past medical history significant for CHF, CAD s/p PCI, HTN, allergic rhinits, asthma, GERD, hx of NSTEMI, HLD.   TEST/EVENTS:  01/04/2021 split-night: AHI 25/h >> optimal CPAP of 8 cmH2O 12/29/2022 PFT: FVC 76, FEV1 83, ratio 84, TLC 68, DLCO 35 corrects to 68 for alveolar volume.  No BD.  Moderate restriction with moderate diffusing defect. 01/11/2023 HRCT chest: Atherosclerosis.  Enlarged pulmonic trunk and heart.  Biapical pleural-parenchymal scarring.  Negative for ILD.  6 mm nodule in the lateral left lobe, unchanged.  4 mm nodule in the right major fissure, unchanged.  Minimal air trapping.  Liver margin is slightly irregular, raises suspicion for cirrhosis.  Probable subcentimeter cyst in the right hepatic lobe.  12/30/2022: OV with Dr. Judeth Horn.  Workup for DOE present over a year.  History of asthma in the past.  Prescribed Advair at last visit.  Has been without this for couple weeks.  Breathing tends to worsen off it.  Albuterol does seem to help some.  Even on the Advair though her symptoms are adequately controlled.  She did have PFT which showed decreased DLCO and restriction, concerning for ILD.  Discussed results and next step including high-resolution CT of the chest.  02/17/2023: Today-follow-up Patient presents today for follow up to discuss HRCT chest results. She did not have any evidence of ILD. She did have an enlarged pulmonary artery, concerning for pulmonary hypertension. She does have a history of sleep apnea which was diagnosed around a year and a half ago. Moderately severe. She was started on CPAP, which she wears  nightly. Followed by [facility]. She feels like she receives benefit from use. Last time she had a follow up, was told she was well controlled.  She tells me she feels unchanged compared to her last visit.  She is unable to walk to the mailbox and back without getting short winded and having to rest.  This has been ongoing for over a year and she is really frustrated by it.  She does have an occasional cough, which is primarily dry.  Not bothersome.  She has not noticed any wheezing.  Does have some intermittent swelling in her legs which she uses Lasix as needed for.  Denies any chest congestion, orthopnea, PND, palpitations, new chest pain, dizziness, syncope.  She had stopped the Advair after her last visit.  She is not sure if this makes a huge difference or not.  She does feel like the albuterol helps but does not entirely resolve her symptoms.  Allergies  Allergen Reactions   Codeine Anaphylaxis   Garlic Anaphylaxis   Morphine And Codeine Nausea And Vomiting   Atorvastatin Other (See Comments)    Muscle aches   Pravastatin Sodium Other (See Comments)    Muscle pain    Immunization History  Administered Date(s) Administered   Fluad Quad(high Dose 65+) 06/03/2013   Influenza Split 06/19/2004, 07/07/2009, 06/28/2010, 06/21/2011, 05/14/2012   Influenza, High Dose Seasonal PF 08/12/2014, 08/12/2014, 04/29/2015, 04/04/2016, 05/08/2019   Influenza,inj,Quad PF,6+ Mos 04/29/2015, 04/04/2016   Influenza-Unspecified 07/07/2009, 07/07/2009, 06/28/2010, 06/28/2010, 06/21/2011, 06/21/2011, 05/14/2012, 05/14/2012, 06/03/2013, 04/29/2015,  04/04/2016, 04/30/2018   PFIZER Comirnaty(Gray Top)Covid-19 Tri-Sucrose Vaccine 08/10/2020   PFIZER(Purple Top)SARS-COV-2 Vaccination 08/23/2019, 09/13/2019   Pneumococcal Conjugate-13 07/26/2013   Pneumococcal Polysaccharide-23 05/14/2012, 05/14/2012   Zoster, Live 08/02/2013    Past Medical History:  Diagnosis Date   Arthritis    "knees, hips, hands, back"  (09/15/2016)   Constipation    Coronary artery disease    NSTEMI-PCI March 2018   Exercise-induced asthma    Family history of adverse reaction to anesthesia    "daughter gets PONV too"   GERD (gastroesophageal reflux disease)    Heart murmur dx'd 09/14/2016   High cholesterol    Hypertension    Migraine    "recently had series of injections; none since; they had been weekly 1 month ago" (09/15/2016)   Necrotizing fasciitis (HCC)    R index finger- had a amp   NSTEMI (non-ST elevated myocardial infarction) (HCC)    Pneumonia ~ 2003   PONV (postoperative nausea and vomiting)    Patch helps   RSD (reflex sympathetic dystrophy)    Right arm after surgery    Tobacco History: Social History   Tobacco Use  Smoking Status Never  Smokeless Tobacco Never   Counseling given: Not Answered   Outpatient Medications Prior to Visit  Medication Sig Dispense Refill   acetaminophen (TYLENOL) 500 MG tablet Take 500-650 mg by mouth every 6 (six) hours as needed for headache or moderate pain.     AIMOVIG 70 MG/ML SOAJ Inject 70 mg into the skin every 30 (thirty) days.     albuterol (VENTOLIN HFA) 108 (90 Base) MCG/ACT inhaler Inhale 2 puffs into the lungs every 6 (six) hours as needed for wheezing or shortness of breath. 8 g 6   aspirin EC 81 MG tablet Take 81 mg by mouth daily.     carvedilol (COREG) 25 MG tablet Take by mouth.     clopidogrel (PLAVIX) 75 MG tablet Take 1 tablet (75 mg total) by mouth daily. 90 tablet 3   cyanocobalamin (VITAMIN B12) 1000 MCG/ML injection Inject 1,000 mcg into the skin every 30 (thirty) days.     fluticasone (FLONASE) 50 MCG/ACT nasal spray Place 1-2 sprays into both nostrils daily as needed for allergies.     fluticasone-salmeterol (ADVAIR) 250-50 MCG/ACT AEPB Inhale 1 puff into the lungs in the morning and at bedtime. 60 each 3   furosemide (LASIX) 40 MG tablet Take 1 tablet (40 mg total) by mouth every other day. 90 tablet 1   levocetirizine (XYZAL) 5 MG  tablet Take 5 mg by mouth every evening.     montelukast (SINGULAIR) 10 MG tablet Take 10 mg by mouth daily.     nitroGLYCERIN (NITROSTAT) 0.4 MG SL tablet Place 1 tablet (0.4 mg total) under the tongue every 5 (five) minutes x 3 doses as needed. 75 tablet 2   nortriptyline (PAMELOR) 25 MG capsule Take 25 mg by mouth 2 (two) times daily.     pantoprazole (PROTONIX) 40 MG tablet Take 1 tablet (40 mg total) by mouth daily. 30 tablet 2   potassium chloride SA (KLOR-CON M) 20 MEQ tablet TAKE 1 TABLET BY MOUTH ONCE DAILY. 90 tablet 3   Propylene Glycol (SYSTANE COMPLETE OP) Place 1 drop into both eyes daily as needed (dry eyes).     REPATHA SURECLICK 140 MG/ML SOAJ INJECT 140 MG INTO THE SKIN EVERY 14 DAYS. (Patient taking differently: Inject 140 mg into the skin every 14 (fourteen) days.) 6 mL 3   sertraline (  ZOLOFT) 100 MG tablet Take 100 mg by mouth daily.     Vitamin D, Ergocalciferol, (DRISDOL) 1.25 MG (50000 UNIT) CAPS capsule Take 50,000 Units by mouth once a week.     ticagrelor (BRILINTA) 90 MG TABS tablet Take 90 mg by mouth 2 (two) times daily.     No facility-administered medications prior to visit.     Review of Systems:   Constitutional: No weight loss or gain, night sweats, fevers, chills, or lassitude.+fatigue  HEENT: No headaches, difficulty swallowing, tooth/dental problems, or sore throat. No sneezing, itching, ear ache, nasal congestion, or post nasal drip CV:  +intermittent swelling in lower extremities. No chest pain, orthopnea, PND, anasarca, dizziness, palpitations, syncope Resp: +shortness of breath with exertion; minimal dry cough. No excess mucus or change in color of mucus. No hemoptysis. No wheezing.  No chest wall deformity GI:  No heartburn, indigestion GU: No dysuria, change in color of urine, urgency or frequency.  Skin: No rash, lesions, ulcerations MSK:  No joint pain or swelling.   Neuro: No memory impairment  Psych: No depression or anxiety. Mood stable.      Physical Exam:  BP (!) 144/78   Pulse 62   Ht 5\' 5"  (1.651 m)   Wt 178 lb (80.7 kg)   SpO2 96%   BMI 29.62 kg/m   GEN: Pleasant, interactive, well-appearing; in no acute distress HEENT:  Normocephalic and atraumatic. PERRLA. Sclera white. Nasal turbinates pink, moist and patent bilaterally. No rhinorrhea present. Oropharynx pink and moist, without exudate or edema. No lesions, ulcerations, or postnasal drip.  NECK:  Supple w/ fair ROM. No JVD present. Normal carotid impulses w/o bruits. Thyroid symmetrical with no goiter or nodules palpated. No lymphadenopathy.   CV: RRR, no m/r/g, no peripheral edema. Pulses intact, +2 bilaterally. No cyanosis, pallor or clubbing. PULMONARY:  Unlabored, regular breathing. Clear bilaterally A&P w/o wheezes/rales/rhonchi. No accessory muscle use.  GI: BS present and normoactive. Soft, non-tender to palpation. No organomegaly or masses detected.  MSK: No erythema, warmth or tenderness. Cap refil <2 sec all extrem. No deformities or joint swelling noted.  Neuro: A/Ox3. No focal deficits noted.   Skin: Warm, no lesions or rashe Psych: Normal affect and behavior. Judgement and thought content appropriate.     Lab Results:  CBC    Component Value Date/Time   WBC 6.0 11/24/2022 1223   WBC 8.5 01/21/2022 0253   RBC 4.46 11/24/2022 1223   RBC 4.37 01/21/2022 0253   HGB 12.6 11/24/2022 1223   HCT 39.1 11/24/2022 1223   PLT 293 11/24/2022 1223   MCV 88 11/24/2022 1223   MCH 28.3 11/24/2022 1223   MCH 28.6 01/21/2022 0253   MCHC 32.2 11/24/2022 1223   MCHC 33.2 01/21/2022 0253   RDW 13.6 11/24/2022 1223   LYMPHSABS 1.4 01/17/2022 1434   MONOABS 0.4 01/17/2022 1434   EOSABS 0.3 01/17/2022 1434   BASOSABS 0.0 01/17/2022 1434    BMET    Component Value Date/Time   NA 140 11/24/2022 1223   K 4.8 11/24/2022 1223   CL 102 11/24/2022 1223   CO2 25 11/24/2022 1223   GLUCOSE 102 (H) 11/24/2022 1223   GLUCOSE 90 01/21/2022 0253   BUN 13  11/24/2022 1223   CREATININE 0.98 11/24/2022 1223   CALCIUM 10.2 11/24/2022 1223   GFRNONAA >60 01/21/2022 0253   GFRAA >60 04/26/2019 0438    BNP    Component Value Date/Time   BNP 115.8 (H) 01/17/2022 1434  Imaging:  No results found.  Administration History     None          Latest Ref Rng & Units 12/29/2022    3:40 PM  PFT Results  FVC-Pre L 2.24   FVC-Predicted Pre % 76   FVC-Post L 2.18   FVC-Predicted Post % 74   Pre FEV1/FVC % % 82   Post FEV1/FCV % % 84   FEV1-Pre L 1.84   FEV1-Predicted Pre % 83   FEV1-Post L 1.84   DLCO uncorrected ml/min/mmHg 6.86   DLCO UNC% % 34   DLCO corrected ml/min/mmHg 7.04   DLCO COR %Predicted % 35   DLVA Predicted % 68   TLC L 3.61   TLC % Predicted % 68   RV % Predicted % 65     No results found for: "NITRICOXIDE"      Assessment & Plan:   DOE (dyspnea on exertion) Etiology not entirely clear.  She has had ongoing symptoms for around a year.  No significant improvement with inhaler therapies.  She had pulmonary function testing which showed a moderate restriction and diffusing defect.  She had high-resolution CT scan which was negative for interstitial lung disease.  She did have some evidence of enlarged pulmonary artery and heart, raising concern for possible underlying pulmonary hypertension.  She does have a history of moderately severe sleep apnea, diagnosed around a year and a half ago.  She does wear CPAP nightly.  Will arrange for her to have repeat echocardiogram and BNP.  If evidence of right heart strain, elevated pulmonary artery pressures or if inconclusive, will move forward with right heart catheterization for further evaluation.  If unremarkable, will consider cardiopulmonary exercise testing for further evaluation.  Walk test today without desaturations.  Will have her complete Ono on CPAP to ensure she is not having nocturnal desaturations.  Will also check a CBC with differential to ensure that she  is not have an anemia that could be causing the diffusing capacity defect.  Patient Instructions  Continue Albuterol inhaler 2 puffs every 6 hours as needed for shortness of breath or wheezing. Notify if symptoms persist despite rescue inhaler/neb use.  Restart Advair 1 puff Twice daily. Brush tongue and rinse mouth afterwards. See how this helps. If you have even minimal improvement, you can stay on this Continue xyzal 1 tab daily Continue lasix 1 tab daily as needed for weight gain/swelling Continue singulair 1 tab daily Continue CPAP nightly  Your CT chest showed an enlarged pulmonary artery, which is concerning for pulmonary hypertension, especially with your history of moderately severe sleep apnea and CHF. I have ordered an echocardiogram and labs to further evaluation. If echo is inconclusive or shows evidence of elevated pulmonary pressures and/or right heart strain, we will have you undergo right heart catheterization. If echo and labs are normal, we will schedule you for exercise stress testing.  Labs today   Walk test today without any low oxygen levels   Overnight oxygen study on CPAP - someone will contact you to schedule this  Discuss question of cirrhosis on CT imaging with your primary care provider and determine if further workup necessary  CT chest in one year to ensure nodules are stable.   Follow up as scheduled with Dr. Judeth Horn. If symptoms do not improve or worsen, please contact office for sooner follow up or seek emergency care.    Extrinsic asthma Does have some response to SABA.  She has questioned if she  improves with Advair in the past.  Will have her resume this to see if she has any improved benefit.  We did discuss that it may not entirely resolve her symptoms if there is another underlying factor.  She is willing to restart this.  Action plan in place.  CHF (congestive heart failure) (HCC) Euvolemic on exam.  See above plan.  Moderate obstructive sleep  apnea Managed at [facility].  On CPAP nightly.  Receives benefit from use.  Compliant per her report.  See above plan.  Lung nodules Stable, likely benign.  Will obtain CT chest in 1 year to document 2 years of stability.   I spent 45 minutes of dedicated to the care of this patient on the date of this encounter to include pre-visit review of records, face-to-face time with the patient discussing conditions above, post visit ordering of testing, clinical documentation with the electronic health record, making appropriate referrals as documented, and communicating necessary findings to members of the patients care team.  Noemi Chapel, NP 02/17/2023  Pt aware and understands NP's role.

## 2023-02-17 NOTE — Assessment & Plan Note (Signed)
Euvolemic on exam.  See above plan.

## 2023-03-14 ENCOUNTER — Ambulatory Visit (HOSPITAL_COMMUNITY): Payer: Medicare Other | Attending: Cardiology

## 2023-03-14 DIAGNOSIS — R0609 Other forms of dyspnea: Secondary | ICD-10-CM | POA: Diagnosis present

## 2023-03-14 LAB — ECHOCARDIOGRAM COMPLETE
AR max vel: 1.72 cm2
AV Area VTI: 1.84 cm2
AV Area mean vel: 1.79 cm2
AV Mean grad: 6.5 mmHg
AV Peak grad: 12 mmHg
Ao pk vel: 1.74 m/s
Area-P 1/2: 2.62 cm2
P 1/2 time: 918 msec
S' Lateral: 2.6 cm

## 2023-03-16 ENCOUNTER — Telehealth: Payer: Self-pay | Admitting: Nurse Practitioner

## 2023-03-31 NOTE — Telephone Encounter (Signed)
Called and spoke with pt about results. She had no questions. Nfn

## 2023-04-05 ENCOUNTER — Ambulatory Visit: Payer: Medicare Other | Admitting: Pulmonary Disease

## 2023-04-05 ENCOUNTER — Encounter: Payer: Self-pay | Admitting: Pulmonary Disease

## 2023-04-05 VITALS — BP 136/70 | HR 60 | Ht 65.5 in | Wt 177.4 lb

## 2023-04-05 DIAGNOSIS — J4540 Moderate persistent asthma, uncomplicated: Secondary | ICD-10-CM

## 2023-04-05 DIAGNOSIS — R918 Other nonspecific abnormal finding of lung field: Secondary | ICD-10-CM | POA: Diagnosis not present

## 2023-04-05 NOTE — Progress Notes (Signed)
@Patient  ID: [name] [name], female    DOB: 1946/02/04, 77 y.o.   MRN: [id number]  Chief Complaint  Patient presents with   Follow-up    Pt is here for F/U (DOE, OSA) visit. Pt states she always has SOB.    Referring provider: Tracey Harries, MD  HPI:   77 y.o. woman whom we are seeing for evaluation of dyspnea on exertion.  Most recent pulmonary note Rhunette Croft, NP reviewed.  Most recent cardiology note reviewed.  At last visit concern for poorly controlled asthma.  Advair was stepped up to high-dose Trelegy via samples.  A CT scan was obtained given low DLCO with normal spirometry.  This showed no ILD which is good news.  Trelegy not help.  She was instructed to resume her Advair at last visit, 02/2023.  She did this for a few weeks.  No improvement.  She is a bit discouraged.  We reviewed her CT scan, no ILD.  Reviewed that there are small nodules needs follow-up July 2025 she expressed understanding.  CT scan already ordered.  Reviewed her pulmonary function test 12/2022, spirometry is normal.  Does show some signs of restriction and low DLCO.  But again, we reviewed CT scan demonstrated no ILD.  Suspect there are other causes of this, not related to the lung.  Encouraged cardiology follow-up.  HPI at initial visit: Patient additional exertion going on about a year now.  Presented to her PCP for evaluation.  Underwent left heart catheterization and POBA to first diagonal with 95% stenosed also with a 60% mid LAD lesion.  She got no benefit from this.  This led to CTA PE protocol that showed no PE but did demonstrate  left lower lobe 8 mm 01/2022.  Otherwise clear parenchyma on my review interpretation.  Repeat CT scan 08/2022 via [facility], images not available to me, demonstrates reported mild bronchiectasis, 7 mm partially calcified nodule in the left lower lobe previously described as noncalcified, small 4 mm fissure right lower lobe nodule not described on prior imaging.  Chest history of  asthma.  On inhalers in the past.  Has albuterol at home for exposure to the garlic.  Has not used in years.  It is expired.  In the past she had more cough when her asthma was poorly controlled.  She does not recall if she used back then.  This was years ago.  Initially denied much cough.  Then described coughing fits with deep breathing.  Some coughing on exam with deep breath as well.    Questionaires / Pulmonary Flowsheets:   ACT:      No data to display          MMRC:     No data to display          Epworth:      No data to display          Tests:   FENO:  No results found for: "NITRICOXIDE"  PFT:    Latest Ref Rng & Units 12/29/2022    3:40 PM  PFT Results  FVC-Pre L 2.24   FVC-Predicted Pre % 76   FVC-Post L 2.18   FVC-Predicted Post % 74   Pre FEV1/FVC % % 82   Post FEV1/FCV % % 84   FEV1-Pre L 1.84   FEV1-Predicted Pre % 83   FEV1-Post L 1.84   DLCO uncorrected ml/min/mmHg 6.86   DLCO UNC% % 34   DLCO corrected ml/min/mmHg 7.04  DLCO COR %Predicted % 35   DLVA Predicted % 68   TLC L 3.61   TLC % Predicted % 68   RV % Predicted % 65   Personally reviewed interpreted spirometry suggestive of mild restriction versus air trapping.  Moderate restriction confirmed on lung volumes via TLC.  DLCO severely reduced.  WALK:      No data to display          Imaging: Personally reviewed and as per EMR and discussion in this note ECHOCARDIOGRAM COMPLETE  Result Date: 03/14/2023    ECHOCARDIOGRAM REPORT   Patient Name:   [name] [name] Date of Exam: 03/14/2023 Medical Rec #:  [id number]        Height:       65.0 in Accession #:    [id number]       Weight:       178.0 lb Date of Birth:  [birth year]        BSA:          1.883 m Patient Age:    77 years         BP:           144/78 mmHg Patient Gender: F                HR:           63 bpm. Exam Location:  [address] Procedure: 2D Echo, Cardiac Doppler and Color Doppler Indications:    R06.00 Dyspnea   History:        Patient has prior history of Echocardiogram examinations, most                 recent 01/19/2022. CAD, Signs/Symptoms:Murmur; Risk                 Factors:Hypertension and Dyslipidemia. NSTEMI.  Sonographer:    Daphine Deutscher RDCS Referring Phys: [id number] Ruby Cola COBB IMPRESSIONS  1. Left ventricular ejection fraction, by estimation, is 60 to 65%. The left ventricle has normal function. The left ventricle has no regional wall motion abnormalities. There is mild concentric left ventricular hypertrophy. Left ventricular diastolic parameters are consistent with Grade I diastolic dysfunction (impaired relaxation).  2. Right ventricular systolic function is normal. The right ventricular size is normal.  3. Left atrial size was mildly dilated.  4. The mitral valve is normal in structure. No evidence of mitral valve regurgitation. No evidence of mitral stenosis.  5. The aortic valve is tricuspid. There is mild calcification of the aortic valve. Aortic valve regurgitation is trivial. Aortic valve sclerosis/calcification is present, without any evidence of aortic stenosis.  6. The inferior vena cava is normal in size with greater than 50% respiratory variability, suggesting right atrial pressure of 3 mmHg. FINDINGS  Left Ventricle: Left ventricular ejection fraction, by estimation, is 60 to 65%. The left ventricle has normal function. The left ventricle has no regional wall motion abnormalities. The left ventricular internal cavity size was normal in size. There is  mild concentric left ventricular hypertrophy. Left ventricular diastolic parameters are consistent with Grade I diastolic dysfunction (impaired relaxation). Right Ventricle: The right ventricular size is normal. No increase in right ventricular wall thickness. Right ventricular systolic function is normal. Left Atrium: Left atrial size was mildly dilated. Right Atrium: Right atrial size was normal in size. Pericardium: There is no  evidence of pericardial effusion. Mitral Valve: The mitral valve is normal in structure. No evidence of mitral valve regurgitation. No evidence of mitral valve  DLCO COR %Predicted % 35   DLVA Predicted % 68   TLC L 3.61   TLC % Predicted % 68   RV % Predicted % 65   Personally reviewed interpreted spirometry suggestive of mild restriction versus air trapping.  Moderate restriction confirmed on lung volumes via TLC.  DLCO severely reduced.  WALK:      No data to display          Imaging: Personally reviewed and as per EMR and discussion in this note ECHOCARDIOGRAM COMPLETE  Result Date: 03/14/2023    ECHOCARDIOGRAM REPORT   Patient Name:   [name] [name] Date of Exam: 03/14/2023 Medical Rec #:  [id number]        Height:       65.0 in Accession #:    [id number]       Weight:       178.0 lb Date of Birth:  [birth year]        BSA:          1.883 m Patient Age:    77 years         BP:           144/78 mmHg Patient Gender: F                HR:           63 bpm. Exam Location:  [address] Procedure: 2D Echo, Cardiac Doppler and Color Doppler Indications:    R06.00 Dyspnea   History:        Patient has prior history of Echocardiogram examinations, most                 recent 01/19/2022. CAD, Signs/Symptoms:Murmur; Risk                 Factors:Hypertension and Dyslipidemia. NSTEMI.  Sonographer:    Daphine Deutscher RDCS Referring Phys: [id number] Ruby Cola COBB IMPRESSIONS  1. Left ventricular ejection fraction, by estimation, is 60 to 65%. The left ventricle has normal function. The left ventricle has no regional wall motion abnormalities. There is mild concentric left ventricular hypertrophy. Left ventricular diastolic parameters are consistent with Grade I diastolic dysfunction (impaired relaxation).  2. Right ventricular systolic function is normal. The right ventricular size is normal.  3. Left atrial size was mildly dilated.  4. The mitral valve is normal in structure. No evidence of mitral valve regurgitation. No evidence of mitral stenosis.  5. The aortic valve is tricuspid. There is mild calcification of the aortic valve. Aortic valve regurgitation is trivial. Aortic valve sclerosis/calcification is present, without any evidence of aortic stenosis.  6. The inferior vena cava is normal in size with greater than 50% respiratory variability, suggesting right atrial pressure of 3 mmHg. FINDINGS  Left Ventricle: Left ventricular ejection fraction, by estimation, is 60 to 65%. The left ventricle has normal function. The left ventricle has no regional wall motion abnormalities. The left ventricular internal cavity size was normal in size. There is  mild concentric left ventricular hypertrophy. Left ventricular diastolic parameters are consistent with Grade I diastolic dysfunction (impaired relaxation). Right Ventricle: The right ventricular size is normal. No increase in right ventricular wall thickness. Right ventricular systolic function is normal. Left Atrium: Left atrial size was mildly dilated. Right Atrium: Right atrial size was normal in size. Pericardium: There is no  evidence of pericardial effusion. Mitral Valve: The mitral valve is normal in structure. No evidence of mitral valve regurgitation. No evidence of mitral valve  @Patient  ID: [name] [name], female    DOB: 1946/02/04, 77 y.o.   MRN: [id number]  Chief Complaint  Patient presents with   Follow-up    Pt is here for F/U (DOE, OSA) visit. Pt states she always has SOB.    Referring provider: Tracey Harries, MD  HPI:   77 y.o. woman whom we are seeing for evaluation of dyspnea on exertion.  Most recent pulmonary note Rhunette Croft, NP reviewed.  Most recent cardiology note reviewed.  At last visit concern for poorly controlled asthma.  Advair was stepped up to high-dose Trelegy via samples.  A CT scan was obtained given low DLCO with normal spirometry.  This showed no ILD which is good news.  Trelegy not help.  She was instructed to resume her Advair at last visit, 02/2023.  She did this for a few weeks.  No improvement.  She is a bit discouraged.  We reviewed her CT scan, no ILD.  Reviewed that there are small nodules needs follow-up July 2025 she expressed understanding.  CT scan already ordered.  Reviewed her pulmonary function test 12/2022, spirometry is normal.  Does show some signs of restriction and low DLCO.  But again, we reviewed CT scan demonstrated no ILD.  Suspect there are other causes of this, not related to the lung.  Encouraged cardiology follow-up.  HPI at initial visit: Patient additional exertion going on about a year now.  Presented to her PCP for evaluation.  Underwent left heart catheterization and POBA to first diagonal with 95% stenosed also with a 60% mid LAD lesion.  She got no benefit from this.  This led to CTA PE protocol that showed no PE but did demonstrate  left lower lobe 8 mm 01/2022.  Otherwise clear parenchyma on my review interpretation.  Repeat CT scan 08/2022 via [facility], images not available to me, demonstrates reported mild bronchiectasis, 7 mm partially calcified nodule in the left lower lobe previously described as noncalcified, small 4 mm fissure right lower lobe nodule not described on prior imaging.  Chest history of  asthma.  On inhalers in the past.  Has albuterol at home for exposure to the garlic.  Has not used in years.  It is expired.  In the past she had more cough when her asthma was poorly controlled.  She does not recall if she used back then.  This was years ago.  Initially denied much cough.  Then described coughing fits with deep breathing.  Some coughing on exam with deep breath as well.    Questionaires / Pulmonary Flowsheets:   ACT:      No data to display          MMRC:     No data to display          Epworth:      No data to display          Tests:   FENO:  No results found for: "NITRICOXIDE"  PFT:    Latest Ref Rng & Units 12/29/2022    3:40 PM  PFT Results  FVC-Pre L 2.24   FVC-Predicted Pre % 76   FVC-Post L 2.18   FVC-Predicted Post % 74   Pre FEV1/FVC % % 82   Post FEV1/FCV % % 84   FEV1-Pre L 1.84   FEV1-Predicted Pre % 83   FEV1-Post L 1.84   DLCO uncorrected ml/min/mmHg 6.86   DLCO UNC% % 34   DLCO corrected ml/min/mmHg 7.04  DLCO COR %Predicted % 35   DLVA Predicted % 68   TLC L 3.61   TLC % Predicted % 68   RV % Predicted % 65   Personally reviewed interpreted spirometry suggestive of mild restriction versus air trapping.  Moderate restriction confirmed on lung volumes via TLC.  DLCO severely reduced.  WALK:      No data to display          Imaging: Personally reviewed and as per EMR and discussion in this note ECHOCARDIOGRAM COMPLETE  Result Date: 03/14/2023    ECHOCARDIOGRAM REPORT   Patient Name:   [name] [name] Date of Exam: 03/14/2023 Medical Rec #:  [id number]        Height:       65.0 in Accession #:    [id number]       Weight:       178.0 lb Date of Birth:  [birth year]        BSA:          1.883 m Patient Age:    77 years         BP:           144/78 mmHg Patient Gender: F                HR:           63 bpm. Exam Location:  [address] Procedure: 2D Echo, Cardiac Doppler and Color Doppler Indications:    R06.00 Dyspnea   History:        Patient has prior history of Echocardiogram examinations, most                 recent 01/19/2022. CAD, Signs/Symptoms:Murmur; Risk                 Factors:Hypertension and Dyslipidemia. NSTEMI.  Sonographer:    Daphine Deutscher RDCS Referring Phys: [id number] Ruby Cola COBB IMPRESSIONS  1. Left ventricular ejection fraction, by estimation, is 60 to 65%. The left ventricle has normal function. The left ventricle has no regional wall motion abnormalities. There is mild concentric left ventricular hypertrophy. Left ventricular diastolic parameters are consistent with Grade I diastolic dysfunction (impaired relaxation).  2. Right ventricular systolic function is normal. The right ventricular size is normal.  3. Left atrial size was mildly dilated.  4. The mitral valve is normal in structure. No evidence of mitral valve regurgitation. No evidence of mitral stenosis.  5. The aortic valve is tricuspid. There is mild calcification of the aortic valve. Aortic valve regurgitation is trivial. Aortic valve sclerosis/calcification is present, without any evidence of aortic stenosis.  6. The inferior vena cava is normal in size with greater than 50% respiratory variability, suggesting right atrial pressure of 3 mmHg. FINDINGS  Left Ventricle: Left ventricular ejection fraction, by estimation, is 60 to 65%. The left ventricle has normal function. The left ventricle has no regional wall motion abnormalities. The left ventricular internal cavity size was normal in size. There is  mild concentric left ventricular hypertrophy. Left ventricular diastolic parameters are consistent with Grade I diastolic dysfunction (impaired relaxation). Right Ventricle: The right ventricular size is normal. No increase in right ventricular wall thickness. Right ventricular systolic function is normal. Left Atrium: Left atrial size was mildly dilated. Right Atrium: Right atrial size was normal in size. Pericardium: There is no  evidence of pericardial effusion. Mitral Valve: The mitral valve is normal in structure. No evidence of mitral valve regurgitation. No evidence of mitral valve  DLCO COR %Predicted % 35   DLVA Predicted % 68   TLC L 3.61   TLC % Predicted % 68   RV % Predicted % 65   Personally reviewed interpreted spirometry suggestive of mild restriction versus air trapping.  Moderate restriction confirmed on lung volumes via TLC.  DLCO severely reduced.  WALK:      No data to display          Imaging: Personally reviewed and as per EMR and discussion in this note ECHOCARDIOGRAM COMPLETE  Result Date: 03/14/2023    ECHOCARDIOGRAM REPORT   Patient Name:   [name] [name] Date of Exam: 03/14/2023 Medical Rec #:  [id number]        Height:       65.0 in Accession #:    [id number]       Weight:       178.0 lb Date of Birth:  [birth year]        BSA:          1.883 m Patient Age:    77 years         BP:           144/78 mmHg Patient Gender: F                HR:           63 bpm. Exam Location:  [address] Procedure: 2D Echo, Cardiac Doppler and Color Doppler Indications:    R06.00 Dyspnea   History:        Patient has prior history of Echocardiogram examinations, most                 recent 01/19/2022. CAD, Signs/Symptoms:Murmur; Risk                 Factors:Hypertension and Dyslipidemia. NSTEMI.  Sonographer:    Daphine Deutscher RDCS Referring Phys: [id number] Ruby Cola COBB IMPRESSIONS  1. Left ventricular ejection fraction, by estimation, is 60 to 65%. The left ventricle has normal function. The left ventricle has no regional wall motion abnormalities. There is mild concentric left ventricular hypertrophy. Left ventricular diastolic parameters are consistent with Grade I diastolic dysfunction (impaired relaxation).  2. Right ventricular systolic function is normal. The right ventricular size is normal.  3. Left atrial size was mildly dilated.  4. The mitral valve is normal in structure. No evidence of mitral valve regurgitation. No evidence of mitral stenosis.  5. The aortic valve is tricuspid. There is mild calcification of the aortic valve. Aortic valve regurgitation is trivial. Aortic valve sclerosis/calcification is present, without any evidence of aortic stenosis.  6. The inferior vena cava is normal in size with greater than 50% respiratory variability, suggesting right atrial pressure of 3 mmHg. FINDINGS  Left Ventricle: Left ventricular ejection fraction, by estimation, is 60 to 65%. The left ventricle has normal function. The left ventricle has no regional wall motion abnormalities. The left ventricular internal cavity size was normal in size. There is  mild concentric left ventricular hypertrophy. Left ventricular diastolic parameters are consistent with Grade I diastolic dysfunction (impaired relaxation). Right Ventricle: The right ventricular size is normal. No increase in right ventricular wall thickness. Right ventricular systolic function is normal. Left Atrium: Left atrial size was mildly dilated. Right Atrium: Right atrial size was normal in size. Pericardium: There is no  evidence of pericardial effusion. Mitral Valve: The mitral valve is normal in structure. No evidence of mitral valve regurgitation. No evidence of mitral valve

## 2023-04-05 NOTE — Patient Instructions (Signed)
Nice to see you again  Try Breztri 2 puffs in the morning and 2 puffs in the evening.  Rinse your mouth out thoroughly with water after every use.  I recommend people brushing their teeth after they rinse.  Based on the pulmonary function test and the CT scan, I do worry that the heart may be the primary contributor to the breathing issue.  There is some subtle signs of possible asthma on the CT scan.  This is why it is worth trying additional inhalers.  Return to clinic in 3 months or sooner as needed with Dr. Judeth Horn

## 2023-04-21 ENCOUNTER — Ambulatory Visit: Payer: Medicare Other | Attending: Student | Admitting: Student

## 2023-04-21 ENCOUNTER — Encounter: Payer: Self-pay | Admitting: Student

## 2023-04-21 ENCOUNTER — Telehealth: Payer: Self-pay | Admitting: Cardiovascular Disease

## 2023-04-21 VITALS — BP 132/70 | HR 74 | Ht 65.5 in | Wt 177.4 lb

## 2023-04-21 DIAGNOSIS — E785 Hyperlipidemia, unspecified: Secondary | ICD-10-CM | POA: Diagnosis not present

## 2023-04-21 DIAGNOSIS — I25118 Atherosclerotic heart disease of native coronary artery with other forms of angina pectoris: Secondary | ICD-10-CM | POA: Diagnosis not present

## 2023-04-21 DIAGNOSIS — I5032 Chronic diastolic (congestive) heart failure: Secondary | ICD-10-CM

## 2023-04-21 MED ORDER — AMLODIPINE BESYLATE 2.5 MG PO TABS: 2.5000 mg | ORAL_TABLET | Freq: Every day | ORAL | 3 refills | Status: DC

## 2023-04-21 NOTE — Progress Notes (Addendum)
Cardiology Clinic Note   Date: 04/21/2023 ID: Lurena, Naeve 1945-11-27, MRN [id number]  Primary Cardiologist:  [name], MD  Patient Profile    [name] [name] is a 77 y.o. female who presents to the clinic today for evaluation of DOE.     Past medical history significant for: CAD. LHC 09/15/2016 (MI): Ostial to proximal LAD 70%.  PCI with DES to LAD.  EF 50 to 55%. LHC 05/14/2018 (fatigue/dyspnea): In-stent restenosis LAD.  PCI with DES to LAD.  PTCA D1 jailed by previous stent. LHC 04/25/2019 (angina): Patent proximal LAD stent with 40% stenosis in the LAD beyond the stented segment.  Ostial D1 99%.  Balloon angioplasty to D1. R/LHC 01/20/2022 (angina): Mid LAD 60%.  D1 95%.  Nonstenotic proximal to mid LAD lesion previously treated.  Balloon angioplasty performed. Echo 03/14/2023: EF 60 to 65%.  No RWMA.  Mild concentric LVH.  Grade I DD.  Normal RV function.  Mild LAE.  Trivial AI.  Aortic valve sclerosis/calcification without stenosis. Palpitations. Hypertension. Hyperlipidemia. Lipid panel 04/19/2023: LDL 133, HDL 46, TG 164, total 208. GERD.     History of Present Illness    [name] [name] is followed by Dr. Allyson Sabal for the above outlined history. In summary, patient had a NSTEMI in March 2018 with PCI to LAD. She had recurrent symptoms and was re-cathed in October 2019 which showed in-stent restenosis and underwent PCI with DES. She also had a jailed D1. She complained of progressive DOE in October 2020 and LHC revealed patent LAD stent, 40% beyond stented segment and 90% ostial D1. Balloon angioplasty was performed. She underwent hospital admission in July 2023 for chest pain and volume overload. She was diuresed and underwent R/LHC revealing low filling pressures, patent stent, 60% LAD beyond stented area with negative DFR and 95% ostial D1 with balloon angioplasty performed. Patient continued to complain of dyspnea and fatigue and subsequent follow up visits. She was  referred to pulmonology who performed a thorough evaluation and felt her symptoms were not coming from her lungs but rather her heart. Echo August 2024 showed normal LV/RV function, grade I DD.   Discussed the use of AI scribe software for clinical note transcription with the patient, who gave verbal consent to proceed.  The patient presents with increased shortness of breath with a small amount of exertion since February.  No shortness of breath at rest. The patient also reports brief chest pain with heavy exertion that resolves quickly with rest. The patient has been evaluated by pulmonology for these symptoms and was told by the pulmonologist that her symptoms were not coming from her lungs, and it was felt it was coming from her heart.   Patient has not been on Repatha since January due to cost. She was working with Ild[employer] Basset D to get a grant but has not heard anything back. Patient reports mild lower extremity edema if she does not take furosemide every other day. She does not weigh daily. She feels her weight is up from last year and is not sure if this has to do with being inactive secondary to dyspnea.  She has brisk diuresis with current dose of Lasix. PCP started isosorbide 30 mg 2 days ago and patient reports slight improvement "but not enough." She reports developing a migraine within an hour of dose. Suggested taking at night and she agrees to try. If she continues to have a headache encouraged her to try 1/2 tablet.  ROS: All other systems reviewed and are otherwise negative except as noted in History of Present Illness.  Studies Reviewed      EKG is not ordered today.         Physical Exam    VS:  BP 132/70   Pulse 74   Ht 5' 5.5" (1.664 m)   Wt 177 lb 6.4 oz (80.5 kg)   SpO2 92%   BMI 29.07 kg/m  , BMI Body mass index is 29.07 kg/m.  GEN: Well nourished, well developed, in no acute distress. Neck: No JVD or carotid bruits. Cardiac:  RRR. No murmurs. No rubs or gallops.    Respiratory:  Respirations regular and unlabored. Clear to auscultation without rales, wheezing or rhonchi. GI: Soft, nontender, nondistended. Extremities: Radials/DP/PT 2+ and equal bilaterally. No clubbing or cyanosis. No edema.  Skin: Warm and dry, no rash. Neuro: Strength intact.  Assessment & Plan      CAD with Stable Angina Increased shortness of breath with exertion since February 2024. Recent initiation of Isosorbide with some improvement, but associated with migraines. -Continue Isosorbide, but switch to evening dosing to potentially mitigate migraines. Continue aspirin, Plavix, carvedilol. -If migraines persist, consider halving the dose. -Initiate Amlodipine 2.5mg  daily in the morning. -Return for follow-up in 5 weeks.  Chronic Diastolic Heart Failure Normal LV/RV function with grade I DD per echo August 20204. Not weighing daily. She feels her weight is up for the last year and attributes this to inactivity secondary to dyspnea. She reports mild lower extremity edema when she does not take Lasix which she is taking every other day. Brisk diuresis on current dose.  -Continue Carvedilol and Lasix every other day. -Encouraged daily weight monitoring and to take an extra dose of Lasix if weight increases by 3 pounds overnight or trends up by 5 pounds in a week.  Hyperlipidemia Elevated cholesterol due to difficulty obtaining Repatha since January 2024. LDL October 2024 138, not at goal.  -Contact pharmacy to facilitate obtaining Repatha.  Hypertension.  BP today 132/70.  -Continue carvedilol. Adding Amlodipine (as above).       Disposition: Return in 5 weeks to assess response to medication changes and symptom progression or sooner as needed.         Signed, Etta Grandchild. Kynley Metzger, DNP, NP-C

## 2023-04-21 NOTE — Telephone Encounter (Signed)
Scheduled f/u referral to see [name] [name] for this afternoon at 3:35 pm

## 2023-04-21 NOTE — Patient Instructions (Signed)
Medication Instructions:  Begin Amlodipine 2.5mg . Take one tablet daily.  *If you need a refill on your cardiac medications before your next appointment, please call your pharmacy*   Lab Work: none If you have labs (blood work) drawn today and your tests are completely normal, you will receive your results only by: MyChart Message (if you have MyChart) OR A paper copy in the mail If you have any lab test that is abnormal or we need to change your treatment, we will call you to review the results.   Testing/Procedures: none   Follow-Up: At [facility], you and your health needs are our priority.  As part of our continuing mission to provide you with exceptional heart care, we have created designated Provider Care Teams.  These Care Teams include your primary Cardiologist (physician) and Advanced Practice Providers (APPs -  Physician Assistants and Nurse Practitioners) who all work together to provide you with the care you need, when you need it.  We recommend signing up for the patient portal called "MyChart".  Sign up information is provided on this After Visit Summary.  MyChart is used to connect with patients for Virtual Visits (Telemedicine).  Patients are able to view lab/test results, encounter notes, upcoming appointments, etc.  Non-urgent messages can be sent to your provider as well.   To learn more about what you can do with MyChart, go to [email or web address].

## 2023-04-24 ENCOUNTER — Encounter: Payer: Self-pay | Admitting: Pharmacist

## 2023-04-24 ENCOUNTER — Telehealth: Payer: Self-pay | Admitting: Pharmacist

## 2023-04-24 MED ORDER — REPATHA SURECLICK 140 MG/ML ~~LOC~~ SOAJ
140.0000 mg | SUBCUTANEOUS | 0 refills | Status: DC
Start: 2023-04-24 — End: 2023-08-29

## 2023-04-24 NOTE — Telephone Encounter (Signed)
-----   Message from Carlos Levering sent at 04/21/2023  5:39 PM EDT ----- Patient said she was working with one of you to get Repatha approved. She has not had it since January. Are any of you familiar with this? Thank you! DW

## 2023-04-24 NOTE — Progress Notes (Signed)
Not sure what the issue is.  Her PA from 2023 was automatically renewed and is still good to Dec 31 this year.   The [employer] grant is closed, so she can't get it at [place], but if she's not in the coverage gap, it would just be her monthly copay.   Can just send a refill to the pharmacy, it should go through.Marland KitchenMarland Kitchen

## 2023-04-24 NOTE — Telephone Encounter (Signed)
Spoke to patient, can not afford Repatha, waiting to get [employer] g[employer] re-open. Name added to list and provided 2 samples of Repatha. Patient will pick up on Oct 08 at 3:00 PM.

## 2023-04-24 NOTE — Telephone Encounter (Signed)
error 

## 2023-06-06 NOTE — Progress Notes (Unsigned)
Office Visit    Patient Name: [name] [name] Date of Encounter: 06/08/2023  Primary Care Provider:  Tracey Harries, MD Primary Cardiologist:  [name], MD  Chief Complaint    77 year old female with a history of CAD s/p DES-LAD in 2018, DES-LAD in 2019 (ISR), PTCA-D1 in 01/2022, chronic diastolic heart failure, hypertension, and hyperlipidemia who presents for follow-up related to CAD.   Past Medical History    Past Medical History:  Diagnosis Date   Arthritis    "knees, hips, hands, back" (09/15/2016)   Constipation    Coronary artery disease    NSTEMI-PCI March 2018   Exercise-induced asthma    Family history of adverse reaction to anesthesia    "daughter gets PONV too"   GERD (gastroesophageal reflux disease)    Heart murmur dx'd 09/14/2016   High cholesterol    Hypertension    Migraine    "recently had series of injections; none since; they had been weekly 1 month ago" (09/15/2016)   Necrotizing fasciitis (HCC)    R index finger- had a amp   NSTEMI (non-ST elevated myocardial infarction) (HCC)    Pneumonia ~ 2003   PONV (postoperative nausea and vomiting)    Patch helps   RSD (reflex sympathetic dystrophy)    Right arm after surgery   Past Surgical History:  Procedure Laterality Date   ANTERIOR LATERAL LUMBAR FUSION 4 LEVELS Right 09/20/2012   Procedure: ANTERIOR LATERAL LUMBAR FUSION 4 LEVELS;  Surgeon: Maeola Harman, MD;  Location: [facility];  Service: Neurosurgery;  Laterality: Right;  Right L1-2 L2-3 L3-4 L4-5 Anterolateral decompression/fusion/with L5-S1 Posterior lumbar interbody fusion/Percutaneous pedicle screws L1-S1   BACK SURGERY     CATARACT EXTRACTION W/ INTRAOCULAR LENS  IMPLANT, BILATERAL Bilateral 2017   CORONARY ANGIOPLASTY  05/14/2018   CORONARY ANGIOPLASTY WITH STENT PLACEMENT     CORONARY BALLOON ANGIOPLASTY N/A 05/14/2018   Procedure: CORONARY BALLOON ANGIOPLASTY;  Surgeon: Runell Gess, MD;  Location: [facility];  Service:  Cardiovascular;  Laterality: N/A;   CORONARY BALLOON ANGIOPLASTY N/A 04/25/2019   Procedure: CORONARY BALLOON ANGIOPLASTY;  Surgeon: Runell Gess, MD;  Location: [facility];  Service: Cardiovascular;  Laterality: N/A;  diagonal 1st   CORONARY BALLOON ANGIOPLASTY N/A 01/20/2022   Procedure: CORONARY BALLOON ANGIOPLASTY;  Surgeon: Runell Gess, MD;  Location: [facility];  Service: Cardiovascular;  Laterality: N/A;   CORONARY PRESSURE/FFR STUDY N/A 09/15/2016   Procedure: Intravascular Pressure Wire/FFR Study;  Surgeon: Rinaldo Cloud, MD;  Location: [facility] INVASIVE CV LAB;  Service: Cardiovascular;  Laterality: N/A;   CORONARY STENT INTERVENTION N/A 09/15/2016   Procedure: Coronary Stent Intervention;  Surgeon: Rinaldo Cloud, MD;  Location: [facility];  Service: Cardiovascular;  Laterality: N/A;   CORONARY STENT INTERVENTION N/A 05/14/2018   Procedure: CORONARY STENT INTERVENTION;  Surgeon: Runell Gess, MD;  Location: [facility];  Service: Cardiovascular;  Laterality: N/A;   FINGER AMPUTATION Right 2008   Index finger   LEFT HEART CATH AND CORONARY ANGIOGRAPHY N/A 09/15/2016   Procedure: Left Heart Cath and Coronary Angiography;  Surgeon: Rinaldo Cloud, MD;  Location: [facility] INVASIVE CV LAB;  Service: Cardiovascular;  Laterality: N/A;   LEFT HEART CATH AND CORONARY ANGIOGRAPHY N/A 05/14/2018   Procedure: LEFT HEART CATH AND CORONARY ANGIOGRAPHY;  Surgeon: Runell Gess, MD;  [facility]: [facility];  Service: Cardiovascular;  Laterality: N/A;   LEFT HEART CATH AND CORONARY ANGIOGRAPHY N/A 04/25/2019  Procedure: LEFT HEART CATH AND CORONARY ANGIOGRAPHY;  Surgeon: Runell Gess, MD;  [facility]: [facility];  Service: Cardiovascular;  Laterality: N/A;   LEFT HEART CATH AND CORONARY ANGIOGRAPHY N/A 01/20/2022   Procedure: LEFT HEART CATH AND CORONARY ANGIOGRAPHY;  Surgeon: Runell Gess, MD;  [facility]: [facility];  Service: Cardiovascular;   Laterality: N/A;   RIGHT/LEFT HEART CATH AND CORONARY ANGIOGRAPHY N/A 01/20/2022   Procedure: RIGHT/LEFT HEART CATH AND CORONARY ANGIOGRAPHY;  Surgeon: Runell Gess, MD;  Location: [facility];  Service: Cardiovascular;  Laterality: N/A;   TONSILLECTOMY      Allergies  Allergies  Allergen Reactions   Codeine Anaphylaxis   Garlic Anaphylaxis   Morphine And Codeine Nausea And Vomiting   Atorvastatin Other (See Comments)    Muscle aches   Pravastatin Sodium Other (See Comments)    Muscle pain     Labs/Other Studies Reviewed    The following studies were reviewed today:  Cardiac Studies & Procedures   CARDIAC CATHETERIZATION  CARDIAC CATHETERIZATION 01/20/2022  Narrative   Mid LAD lesion is 60% stenosed.   1st Diag lesion is 95% stenosed.   Non-stenotic Prox LAD to Mid LAD lesion was previously treated.   Balloon angioplasty was performed.   Post intervention, there is a 25% residual stenosis.  [name] [name] is a 77 y.o. female   [id number] LOCATION:  FACILITY: [facility] PHYSICIAN: [name], M.D. 10-Feb-1946   DATE OF PROCEDURE:  01/20/2022  DATE OF DISCHARGE:     CARDIAC CATHETERIZATION / POBA D1    History obtained from chart review.77 y.o. female with a hx of CAD, hypertension, hyperlipidemia, and obstructive sleep apnea on CPAP therapy, was admitted 07/04 with SOB,.  She had LAD restenting by myself in 2019 and first diagonal branch ostial PTCA in 2020.  She was admitted with similar symptoms as prior to her diagonal branch intervention 3 years ago.  Her 2D echo was normal with no evidence of diastolic dysfunction.  She presents now for diagnostic coronary angiography.   PROCEDURE DESCRIPTION:  The patient was brought to the second floor [facility] cath lab in the postabsorptive state.  She was not premedicated.  Her right groin was prepped and shaved in usual sterile fashion. Xylocaine 1% was used for local anesthesia. A 5 French sheath was  inserted into the right common femoral artery using standard Seldinger technique.  I did access the right radial artery under ultrasound guidance and demonstrated good blood return through the SideArm sheath however when I passed the catheter there was excessive spasm and therefore I defaulted to the femoral approach as I did 3 years ago.  5 French right left second status and catheters were used for selective coronary angiography and obtain left heart pressures.  Isovue dye is used for the entirety of the case (50 cc of contrast total to patient).  Retrograde aortic, ventricular and pullback pressures were recorded.  The patient received 8000's of heparin with an ACT of 305.  Using a 6 [place] XB LAD 3.0 cm guide catheter along with a Abbott DFR wire across the 60% stenosis beyond the stented segment demonstrating a DFR of 0.96 suggesting this was not physiologically significant.  Her approximately the stent was patent.  The ostium of her first diagonal branch had restenosed similar to 3 years ago.  I crossed the diagonal branch ostium through the stent struts with an 014 [place] guidewire and dilated the diagonal branch ostium with a  2 mm x 8 mm long balloon at 14 atm resulting in reduction of a 95% ostial first diagonal branch stenosis to 25% residual.  The patient tolerated the procedure well.  The guidewire and guide catheter were removed.  The sheath was secured.  His effort wristband was placed on the right wrist after the radial sheath was removed to achieve patent hemostasis.  The patient was already on aspirin and clopidogrel.  Impression Ms. Borgelt had wide a widely patent proximal LAD stented segment, a 60% fairly focal mid LAD stenosis beyond the stented segment at that takeoff of a first large septal perforator.  This was negative by DFR (0.96).  Her first diagonal branch ostium had restenosed which was successfully redilated.  Her LVEDP was 3.  There is no other cardiovascular estimation for her  symptoms.  These were the same presenting symptoms that she had 3 years ago that resolved with diagonal branch ostial intervention.  The femoral sheath will be removed once ACT falls below 170 pressure held.  She will be gently hydrated, and discharged home in the morning on aspirin and clopidogrel.  She apparently needs a total knee replacement.  I prefer to wait 3 to 6 months given her freshly dilated diagonal branch prior to interrupting antiplatelet therapy.  [name]. MD, [facility] 01/20/2022 5:05 PM  Findings Coronary Findings Diagnostic  Dominance: Left  Left Anterior Descending Non-stenotic Prox LAD to Mid LAD lesion was previously treated. Mid LAD lesion is 60% stenosed.  First Diagonal Branch 1st Diag lesion is 95% stenosed.  Intervention  1st Diag lesion Angioplasty Balloon angioplasty was performed. Post-Intervention Lesion Assessment The intervention was successful. Pre-interventional TIMI flow is 3. Post-intervention TIMI flow is 3. No complications occurred at this lesion. There is a 25% residual stenosis post intervention.   CARDIAC CATHETERIZATION  CARDIAC CATHETERIZATION 04/25/2019  Narrative Images from the original result were not included.   Previously placed Prox LAD to Mid LAD stent (unknown type) is widely patent.  Mid LAD lesion is 40% stenosed.  1st Diag lesion is 99% stenosed.  Post intervention, there is a 30% residual stenosis.  The left ventricular systolic function is normal.  LV end diastolic pressure is normal.  The left ventricular ejection fraction is 55-65% by visual estimate.  [name] [name] is a 77 y.o. female   [id number] LOCATION:  FACILITY: [facility] PHYSICIAN: [name], M.D. March 09, 1946   DATE OF PROCEDURE:  04/25/2019  DATE OF DISCHARGE:     CARDIAC CATHETERIZATION / POBA Diag Ostium    History obtained from chart review.[name] [name] is a 77 y.o.  mildly overweight married Caucasian female mother of 3,  grandmother of 21 childrenActually saw after being referred by [facility] .  I last saw her in the office 07/27/2018. 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  She is never smoked.  Her mother did have a myocardial infarction.  She has hypertension hyperlipidemia.  She had a non-STEMI March 2018 and LAD intervention but Dr. Sharyn Lull.  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  She did have a jailed first diagonal branch which was small and a 50% mid LAD beyond the stented segment which I did not think was significant.  Since I saw her virtually 6 months ago she had done well until proxy 1 week ago when she developed progressive dyspnea on exertion and substernal chest pain.  I am concerned that she may have aggressive in-stent restenosis  with her previously placed stents.  She presents today for outpatient diagnostic coronary angiography to define her anatomy.   PROCEDURE DESCRIPTION:  The patient was brought to the second floor [facility] in the postabsorptive state.  She was not premedicated .  Her right groin was prepped and shaved in usual sterile fashion. Xylocaine 1% was used for local anesthesia. A 5 French sheath was inserted into the right common femoral artery using standard Seldinger technique.  5 French right left Judkins diagnostic catheters on 5 French pigtail catheter were used for selective coronary angiography and left ventriculography respectively.  On the pigtail was used for the entirety of the case.  Retrograde aorta, ventricular and pullback pressures were recorded.  The patient received Angiomax bolus followed by infusion with a therapeutic ACT.  She was already on aspirin and Plavix.  Using a 6 [place] XB LAD 3.5 cm guide catheter along with a 0.14 pro-water guidewire the first diagonal branch ostium was easily wired through the previously placed stent struts.  I then dilated the origin  with a 2 mm x 8 mm balloon upsizing to a 2.25 mm x 8 mm balloon.  She was symptomatic with balloon inflation.  Final angiographic result was reduction of a 99% first diagonal branch ostial stenosis to less than 30% residual without dissection.  The patient tolerated procedure well.  Guidewire and catheter were removed.  The sheath was secured.  The patient left lab in stable condition.  Impression Ms. Bushway has a widely patent proximal LAD stent with the most 40% stenosis in the LAD beyond the stented segment.  The first diagonal branch which arose from within the stented segment had a 99% ostial stenosis probably responsible for her symptoms.  I performed POBA with a 2.25 mm x 8 mm balloon with with an acceptable post angioplasty result.  The sheath will be removed in several hours and pressure held.  Patient will be hydrated overnight and discharged home in the morning.  I will see her back in the office in several weeks for follow-up.  If she has restenosis in the future I may place a small 2.25 mm stent at the ostium of the diagonal branch.  [name]. MD, [facility] 04/25/2019 1:48 PM  Findings Coronary Findings Diagnostic  Dominance: Left  Left Anterior Descending Previously placed Prox LAD to Mid LAD stent (unknown type) is widely patent. Mid LAD lesion is 40% stenosed.  First Diagonal Branch 1st Diag lesion is 99% stenosed.  Intervention  1st Diag lesion Angioplasty Post-Intervention Lesion Assessment The intervention was successful. Pre-interventional TIMI flow is 3. Post-intervention TIMI flow is 3. No complications occurred at this lesion. There is a 30% residual stenosis post intervention.     ECHOCARDIOGRAM  ECHOCARDIOGRAM COMPLETE 03/14/2023  Narrative ECHOCARDIOGRAM REPORT    Patient Name:   [name] [name] Date of Exam: 03/14/2023 Medical Rec #:  [id number]        Height:       65.0 in Accession #:    [id number]       Weight:       178.0 lb Date of Birth:   1945/09/15        BSA:          1.883 m Patient Age:    77 years         BP:           144/78 mmHg Patient Gender: F  HR:           63 bpm. Exam Location:  [address]  Procedure: 2D Echo, Cardiac Doppler and Color Doppler  Indications:    R06.00 Dyspnea  History:        Patient has prior history of Echocardiogram examinations, most recent 01/19/2022. CAD, Signs/Symptoms:Murmur; Risk Factors:Hypertension and Dyslipidemia. NSTEMI.  Sonographer:    Daphine Deutscher RDCS Referring Phys: [id number] [employer] COBB  IMPRESSIONS   1. Left ventricular ejection fraction, by estimation, is 60 to 65%. The left ventricle has normal function. The left ventricle has no regional wall motion abnormalities. There is mild concentric left ventricular hypertrophy. Left ventricular diastolic parameters are consistent with Grade I diastolic dysfunction (impaired relaxation). 2. Right ventricular systolic function is normal. The right ventricular size is normal. 3. Left atrial size was mildly dilated. 4. The mitral valve is normal in structure. No evidence of mitral valve regurgitation. No evidence of mitral stenosis. 5. The aortic valve is tricuspid. There is mild calcification of the aortic valve. Aortic valve regurgitation is trivial. Aortic valve sclerosis/calcification is present, without any evidence of aortic stenosis. 6. The inferior vena cava is normal in size with greater than 50% respiratory variability, suggesting right atrial pressure of 3 mmHg.  FINDINGS Left Ventricle: Left ventricular ejection fraction, by estimation, is 60 to 65%. The left ventricle has normal function. The left ventricle has no regional wall motion abnormalities. The left ventricular internal cavity size was normal in size. There is mild concentric left ventricular hypertrophy. Left ventricular diastolic parameters are consistent with Grade I diastolic dysfunction (impaired relaxation).  Right Ventricle:  The right ventricular size is normal. No increase in right ventricular wall thickness. Right ventricular systolic function is normal.  Left Atrium: Left atrial size was mildly dilated.  Right Atrium: Right atrial size was normal in size.  Pericardium: There is no evidence of pericardial effusion.  Mitral Valve: The mitral valve is normal in structure. No evidence of mitral valve regurgitation. No evidence of mitral valve stenosis.  Tricuspid Valve: The tricuspid valve is normal in structure. Tricuspid valve regurgitation is trivial. No evidence of tricuspid stenosis.  :The aortic valve is tricuspid. There is mild calcification of the aortic valve. Aortic valve regurgitation is trivial. Aortic valve sclerosis/calcification is present, without any evidence of aortic stenosis. Pulmonic Valve: The pulmonic valve was normal in structure. Pulmonic valve regurgitation is trivial. No evidence of pulmonic stenosis.  Aorta: The aortic root is normal in size and structure.  Venous: The inferior vena cava is normal in size with greater than 50% respiratory variability, suggesting right atrial pressure of 3 mmHg.  IAS/Shunts: No atrial level shunt detected by color flow Doppler.   LEFT VENTRICLE PLAX 2D LVIDd:         4.80 cm   Diastology LVIDs:         2.60 cm   LV e' medial:    8.75 cm/s LV PW:         1.10 cm   LV E/e' medial:  7.1 LV IVS:        1.10 cm   LV e' lateral:   8.92 cm/s LVOT diam:     1.90 cm   LV E/e' lateral: 7.0 LV SV:         73 LV SV Index:   39 LVOT Area:     2.84 cm   RIGHT VENTRICLE             IVC RV Basal diam:  3.60 cm     IVC diam: 1.50 cm RV S prime:     15.20 cm/s TAPSE (M-mode): 2.9 cm  LEFT ATRIUM             Index        RIGHT ATRIUM           Index LA diam:        4.50 cm 2.39 cm/m   RA Area:     13.60 cm LA Vol (A2C):   45.4 ml 24.12 ml/m  RA Volume:   35.10 ml  18.64 ml/m LA Vol (A4C):   47.2 ml 25.07 ml/m LA Biplane Vol: 46.5 ml 24.70  ml/m AORTIC VALVE AV Area (Vmax):    1.72 cm AV Area (Vmean):   1.79 cm AV Area (VTI):     1.84 cm AV Vmax:           173.50 cm/s AV Vmean:          115.500 cm/s AV VTI:            0.399 m AV Peak Grad:      12.0 mmHg AV Mean Grad:      6.5 mmHg LVOT Vmax:         105.00 cm/s LVOT Vmean:        72.850 cm/s LVOT VTI:          0.259 m LVOT/AV VTI ratio: 0.65 AI PHT:            918 msec  AORTA Ao Root diam: 3.40 cm Ao Asc diam:  3.40 cm  MITRAL VALVE MV Area (PHT): 2.62 cm    SHUNTS MV Decel Time: 289 msec    Systemic VTI:  0.26 m MV E velocity: 62.35 cm/s  Systemic Diam: 1.90 cm MV A velocity: 81.25 cm/s MV E/A ratio:  0.77  Arvilla Meres MD Electronically signed by Arvilla Meres MD Signature Date/Time: 03/14/2023/5:54:44 PM    Final    MONITORS  CARDIAC EVENT MONITOR 10/18/2017  Narrative 1. SR/SB/ST with PACs          Recent Labs: 02/17/2023: BUN 9; Creatinine, Ser 0.80; Hemoglobin 12.9; Platelets 325.0; Potassium 4.5; Pro B Natriuretic peptide (BNP) 167.0; Sodium 140  Recent Lipid Panel    Component Value Date/Time   CHOL 133 01/21/2022 0253   CHOL 117 03/06/2020 0828   TRIG 185 (H) 01/21/2022 0253   HDL 42 01/21/2022 0253   HDL 54 03/06/2020 0828   CHOLHDL 3.2 01/21/2022 0253   VLDL 37 01/21/2022 0253   LDLCALC 54 01/21/2022 0253   LDLCALC 41 03/06/2020 0828    History of Present Illness    77 year old female with the above past medical history including CAD s/p DES-LAD in 2018, DES-LAD in 2019 (ISR), PTCA-D1 in 01/2022, chronic diastolic heart failure, hypertension, and hyperlipidemia.  She was hospitalized March 2018 in the setting of NSTEMI s/p PCI-LAD.  She had recurrent symptoms and underwent repeat cardiac catheterization in October 2019 which revealed in-stent restenosis s/p DES.  She was noted to have a jailed D1.  She complained of progressive dyspnea on exertion in October 2021 and underwent repeat LHC, which revealed patent LAD stents,  40% stenosis beyond the stented segment, and 90% oD1 stenosis s/p PTCA.  She was hospitalized in July 2023 in the setting of chest pain, fluid volume overload.  She was diuresed with IV Lasix.  R/LHC revealed low filling pressures, patent stent, 60% LAD stenosis beyond stented area, negative DFR, 95%  ostial D1 s/p PTCA.  She has since complained of persistent dyspnea and fatigue.  She was referred to pulmonology.  It was felt that her symptoms were related to her heart and not her lungs.  Echocardiogram in August 2024 showed normal LV/RV function, G1 DD.  She was last seen in the office on 04/21/2023 and noted increased dyspnea on exertion.  She noticed some mild improvement with addition of Imdur, however, she also noted increased migraines.  She was advised to have to take her Imdur in the evening.  She was also started on amlodipine 2.5 mg daily.  She presents today for follow-up. Since her last visit she has been stable from a cardiac standpoint.  She has noted ongoing dyspnea on exertion. She noticed no improvement in her systems with addition of amlodipine. Per pt, symptoms are similar to prior anginal equivalent.  She is concerned.  Home Medications    Current Outpatient Medications  Medication Sig Dispense Refill   acetaminophen (TYLENOL) 500 MG tablet Take 500-650 mg by mouth every 6 (six) hours as needed for headache or moderate pain.     AIMOVIG 70 MG/ML SOAJ Inject 70 mg into the skin every 30 (thirty) days.     albuterol (VENTOLIN HFA) 108 (90 Base) MCG/ACT inhaler Inhale 2 puffs into the lungs every 6 (six) hours as needed for wheezing or shortness of breath. 8 g 6   amLODipine (NORVASC) 2.5 MG tablet Take 1 tablet (2.5 mg total) by mouth daily. 180 tablet 3   aspirin EC 81 MG tablet Take 81 mg by mouth daily.     carvedilol (COREG) 25 MG tablet Take by mouth.     clopidogrel (PLAVIX) 75 MG tablet Take 1 tablet (75 mg total) by mouth daily. 90 tablet 3   Evolocumab (REPATHA SURECLICK) 140  MG/ML SOAJ Inject 140 mg into the skin every 14 (fourteen) days. 2 mL 0   fluticasone (FLONASE) 50 MCG/ACT nasal spray Place 1-2 sprays into both nostrils daily as needed for allergies.     fluticasone-salmeterol (ADVAIR) 250-50 MCG/ACT AEPB Inhale 1 puff into the lungs in the morning and at bedtime. 60 each 3   furosemide (LASIX) 40 MG tablet Take 1 tablet (40 mg total) by mouth every other day. 90 tablet 1   isosorbide mononitrate (IMDUR) 60 MG 24 hr tablet Take 1 tablet (60 mg total) by mouth daily. 90 tablet 3   levocetirizine (XYZAL) 5 MG tablet Take 5 mg by mouth every evening.     montelukast (SINGULAIR) 10 MG tablet Take 10 mg by mouth daily.     nitroGLYCERIN (NITROSTAT) 0.4 MG SL tablet Place 1 tablet (0.4 mg total) under the tongue every 5 (five) minutes x 3 doses as needed. 75 tablet 2   nortriptyline (PAMELOR) 25 MG capsule Take 25 mg by mouth 2 (two) times daily.     pantoprazole (PROTONIX) 40 MG tablet Take 1 tablet (40 mg total) by mouth daily. 30 tablet 2   potassium chloride SA (KLOR-CON M) 20 MEQ tablet TAKE 1 TABLET BY MOUTH ONCE DAILY. 90 tablet 3   Propylene Glycol (SYSTANE COMPLETE OP) Place 1 drop into both eyes daily as needed (dry eyes).     REPATHA SURECLICK 140 MG/ML SOAJ INJECT 140 MG INTO THE SKIN EVERY 14 DAYS. (Patient taking differently: Inject 140 mg into the skin every 14 (fourteen) days.) 6 mL 3   sertraline (ZOLOFT) 100 MG tablet Take 100 mg by mouth daily.     Vitamin  D, Ergocalciferol, (DRISDOL) 1.25 MG (50000 UNIT) CAPS capsule Take 50,000 Units by mouth once a week.     No current facility-administered medications for this visit.     Review of Systems    She denies chest pain, palpitations, pnd, orthopnea, n, v, dizziness, syncope, edema, weight gain, or early satiety. All other systems reviewed and are otherwise negative except as noted above.   Physical Exam    VS:  BP 126/82   Pulse 62   Ht 5' 0.6" (1.539 m)   Wt 180 lb 3.2 oz (81.7 kg)   SpO2  99%   BMI 34.50 kg/m   GEN: Well nourished, well developed, in no acute distress. HEENT: normal. Neck: Supple, no JVD, carotid bruits, or masses. Cardiac: RRR, no murmurs, rubs, or gallops. No clubbing, cyanosis, edema.  Radials/DP/PT 2+ and equal bilaterally.  Respiratory:  Respirations regular and unlabored, clear to auscultation bilaterally. GI: Soft, nontender, nondistended, BS + x 4. MS: no deformity or atrophy. Skin: warm and dry, no rash. Neuro:  Strength and sensation are intact. Psych: Normal affect.  Accessory Clinical Findings    ECG personally reviewed by me today - EKG Interpretation Date/Time:  Thursday June 08 2023 08:16:14 EST Ventricular Rate:  62 PR Interval:  170 QRS Duration:  94 QT Interval:  322 QTC Calculation: 326 R Axis:   -16  Text Interpretation: Normal sinus rhythm Possible Left atrial enlargement Incomplete right bundle branch block Left ventricular hypertrophy with repolarization abnormality ( R in aVL , [employer] product ) Confirmed by Bernadene Person ([id number]) on 06/08/2023 8:21:59 AM  - no acute changes.   Lab Results  Component Value Date   WBC 5.8 02/17/2023   HGB 12.9 02/17/2023   HCT 39.0 02/17/2023   MCV 85.2 02/17/2023   PLT 325.0 02/17/2023   Lab Results  Component Value Date   CREATININE 0.80 02/17/2023   BUN 9 02/17/2023   NA 140 02/17/2023   K 4.5 02/17/2023   CL 106 02/17/2023   CO2 28 02/17/2023   Lab Results  Component Value Date   ALT 15 01/17/2022   AST 19 01/17/2022   ALKPHOS 64 01/17/2022   BILITOT 0.5 01/17/2022   Lab Results  Component Value Date   CHOL 133 01/21/2022   HDL 42 01/21/2022   LDLCALC 54 01/21/2022   TRIG 185 (H) 01/21/2022   CHOLHDL 3.2 01/21/2022    No results found for: "HGBA1C"  Assessment & Plan    1. CAD/dyspnea on exertion: S/p DES-LAD in 2018, DES-LAD in 2019 (ISR), PTCA-D1 in 01/2022.  She notes ongoing dyspnea on exertion, similar to . prior anginal equivalent, mild improvement with  Imdur, no improvement with addition of amlodipine.  Workup with pulmonology was unrevealing.  Through shared decision making, will pursue cardiac PET stress test.  Will increase Imdur to 60 mg daily.  Continue aspirin, Plavix, amlodipine, carvedilol, Imdur, and Repatha.  Informed Consent   Shared Decision Making/Informed Consent The risks [chest pain, shortness of breath, cardiac arrhythmias, dizziness, blood pressure fluctuations, myocardial infarction, stroke/transient ischemic attack, nausea, vomiting, allergic reaction, radiation exposure, metallic taste sensation and life-threatening complications (estimated to be 1 in 10,000)], benefits (risk stratification, diagnosing coronary artery disease, treatment guidance) and alternatives of a cardiac PET stress test were discussed in detail with Ms. Earl Gala and she agrees to proceed.    2. Chronic diastolic heart failure: Echocardiogram in August 2024 showed normal LV/RV function, G1 DD.  Euvolemic and well compensated on exam.  Continue Lasix.  3. Hypertension: BP well controlled. Continue current antihypertensive regimen.   4. Hyperlipidemia: LDL was 54 in 01/2022.  Continue Repatha.  5. Disposition: Follow-up as scheduled with Dr. Allyson Sabal in January 2024.        Joylene Grapes, NP 06/08/2023, 1:41 PM

## 2023-06-08 ENCOUNTER — Ambulatory Visit: Payer: Medicare Other | Attending: Nurse Practitioner | Admitting: Nurse Practitioner

## 2023-06-08 ENCOUNTER — Encounter: Payer: Self-pay | Admitting: Nurse Practitioner

## 2023-06-08 VITALS — BP 126/82 | HR 62 | Ht 60.6 in | Wt 180.2 lb

## 2023-06-08 DIAGNOSIS — I2510 Atherosclerotic heart disease of native coronary artery without angina pectoris: Secondary | ICD-10-CM

## 2023-06-08 DIAGNOSIS — I5032 Chronic diastolic (congestive) heart failure: Secondary | ICD-10-CM

## 2023-06-08 DIAGNOSIS — E785 Hyperlipidemia, unspecified: Secondary | ICD-10-CM

## 2023-06-08 DIAGNOSIS — R0609 Other forms of dyspnea: Secondary | ICD-10-CM

## 2023-06-08 DIAGNOSIS — I10 Essential (primary) hypertension: Secondary | ICD-10-CM | POA: Diagnosis not present

## 2023-06-08 MED ORDER — ISOSORBIDE MONONITRATE ER 60 MG PO TB24: 60.0000 mg | ORAL_TABLET | Freq: Every day | ORAL | 3 refills | Status: DC

## 2023-06-08 NOTE — Patient Instructions (Addendum)
Medication Instructions:  Increase Imdur 60 mg once a day *If you need a refill on your cardiac medications before your next appointment, please call your pharmacy*   Lab Work: No labs   Testing/Procedures: How to Prepare for Your Cardiac PET/CT Stress Test:  1. Please do not take these medications before your test:   Medications that may interfere with the cardiac pharmacological stress agent (ex. nitrates - including erectile dysfunction medications, isosorbide mononitrate- [please start to hold this medication the day before the test], tamulosin or beta-blockers) the day of the exam. (Erectile dysfunction medication should be held for at least 72 hrs prior to test) Theophylline containing medications for 12 hours. Dipyridamole 48 hours prior to the test. Your remaining medications may be taken with water.  2. Nothing to eat or drink, except water, 3 hours prior to arrival time.   NO caffeine/decaffeinated products, or chocolate 12 hours prior to arrival.  3. NO perfume, cologne or lotion on chest or abdomen area.          - FEMALES - Please avoid wearing dresses to this appointment.  4. Total time is 1 to 2 hours; you may want to bring reading material for the waiting time.  5. Please report to Radiology at the [facility] Main Entrance 30 minutes early for your test.  [address] [place], [place] [address]  6. Please report to Radiology at [facility] Main Entrance, medical mall, 30 mins prior to your test.  [address]  [place], [place]  [phone]  Diabetic Preparation:  Hold oral medications. You may take NPH and Lantus insulin. Do not take Humalog or Humulin R (Regular Insulin) the day of your test. Check blood sugars prior to leaving the house. If able to eat breakfast prior to 3 hour fasting, you may take all medications, including your insulin, Do not worry if you miss your breakfast dose of insulin - start at your  next meal. Patients who wear a continuous glucose monitor MUST remove the device prior to scanning.  IF YOU THINK YOU MAY BE PREGNANT, OR ARE NURSING PLEASE INFORM THE TECHNOLOGIST.  In preparation for your appointment, medication and supplies will be purchased.  Appointment availability is limited, so if you need to cancel or reschedule, please call the Radiology Department at [phone] [facility]) OR [phone] [facility])  24 hours in advance to avoid a cancellation fee of $100.00  What to Expect After you Arrive:  Once you arrive and check in for your appointment, you will be taken to a preparation room within the Radiology Department.  A technologist or Nurse will obtain your medical history, verify that you are correctly prepped for the exam, and explain the procedure.  Afterwards,  an IV will be started in your arm and electrodes will be placed on your skin for EKG monitoring during the stress portion of the exam. Then you will be escorted to the PET/CT scanner.  There, staff will get you positioned on the scanner and obtain a blood pressure and EKG.  During the exam, you will continue to be connected to the EKG and blood pressure machines.  A small, safe amount of a radioactive tracer will be injected in your IV to obtain a series of pictures of your heart along with an injection of a stress agent.    After your Exam:  It is recommended that you eat a meal and drink a caffeinated beverage to counter act any effects of  the stress agent.  Drink plenty of fluids for the remainder of the day and urinate frequently for the first couple of hours after the exam.  Your doctor will inform you of your test results within 7-10 business days.  For more information and frequently asked questions, please visit our website : [email or web address]  For questions about your test or how to prepare for your test, please call: Cardiac Imaging Nurse Navigators Office: [phone]     Follow-Up: At [facility], you and your health needs are our priority.  As part of our continuing mission to provide you with exceptional heart care, we have created designated Provider Care Teams.  These Care Teams include your primary Cardiologist (physician) and Advanced Practice Providers (APPs -  Physician Assistants and Nurse Practitioners) who all work together to provide you with the care you need, when you need it.  We recommend signing up for the patient portal called "MyChart".  Sign up information is provided on this After Visit Summary.  MyChart is used to connect with patients for Virtual Visits (Telemedicine).  Patients are able to view lab/test results, encounter notes, upcoming appointments, etc.  Non-urgent messages can be sent to your provider as well.   To learn more about what you can do with MyChart, go to [email or web address].    Your next appointment:   January 8th at 10:00 am with [name], MD

## 2023-06-13 ENCOUNTER — Telehealth (HOSPITAL_COMMUNITY): Payer: Self-pay | Admitting: *Deleted

## 2023-06-13 NOTE — Telephone Encounter (Signed)
Attempted to call patient regarding upcoming cardiac PET appointment. Left message on voicemail with name and callback number  Larey Brick RN Navigator Cardiac Imaging [facility] [phone] Office [phone] Cell  Reminder to avoid caffeine and Imdur prior to her cardiac PET scan.

## 2023-06-14 ENCOUNTER — Encounter (HOSPITAL_COMMUNITY)
Admission: RE | Admit: 2023-06-14 | Discharge: 2023-06-14 | Disposition: A | Discharge status: Home / Self Care | Payer: Medicare Other | Source: Ambulatory Visit | Attending: Nurse Practitioner | Admitting: Nurse Practitioner

## 2023-06-14 DIAGNOSIS — I2510 Atherosclerotic heart disease of native coronary artery without angina pectoris: Secondary | ICD-10-CM | POA: Insufficient documentation

## 2023-06-14 DIAGNOSIS — R0609 Other forms of dyspnea: Secondary | ICD-10-CM | POA: Insufficient documentation

## 2023-06-14 LAB — NM PET CT CARDIAC PERFUSION MULTI W/ABSOLUTE BLOODFLOW
LV dias vol: 96 mL (ref 46–106)
LV sys vol: 44 mL
MBFR: 2.5
Nuc Rest EF: 54 %
Nuc Stress EF: 64 %
Peak HR: 63 {beats}/min
Rest HR: 57 {beats}/min
Rest MBF: 1 ml/g/min
Rest Nuclear Isotope Dose: 21.4 mCi
ST Depression (mm): 0 mm
Stress MBF: 2.5 ml/g/min
Stress Nuclear Isotope Dose: 21.4 mCi

## 2023-06-14 MED ORDER — RUBIDIUM RB82 GENERATOR (RUBYFILL)
21.3900 | PACK | Freq: Once | INTRAVENOUS | Status: AC
  Administered 2023-06-14: 21.39 via INTRAVENOUS

## 2023-06-14 MED ORDER — RUBIDIUM RB82 GENERATOR (RUBYFILL)
21.4200 | PACK | Freq: Once | INTRAVENOUS | Status: AC
  Administered 2023-06-14: 21.42 via INTRAVENOUS

## 2023-06-14 MED ORDER — REGADENOSON 0.4 MG/5ML IV SOLN
INTRAVENOUS | Status: AC
  Filled 2023-06-14: qty 5

## 2023-06-14 MED ORDER — REGADENOSON 0.4 MG/5ML IV SOLN
0.4000 mg | Freq: Once | INTRAVENOUS | Status: AC
  Administered 2023-06-14: 0.4 mg via INTRAVENOUS

## 2023-06-21 ENCOUNTER — Ambulatory Visit: Payer: Medicare Other | Admitting: Pulmonary Disease

## 2023-06-22 ENCOUNTER — Telehealth: Payer: Self-pay

## 2023-06-22 NOTE — Telephone Encounter (Signed)
Pt reviewed cardiac pet stress test results via mychart.

## 2023-07-20 ENCOUNTER — Other Ambulatory Visit: Payer: Medicare Other

## 2023-07-26 ENCOUNTER — Ambulatory Visit: Payer: Medicare Other | Admitting: Cardiovascular Disease

## 2023-07-26 ENCOUNTER — Other Ambulatory Visit: Payer: Self-pay

## 2023-07-26 ENCOUNTER — Emergency Department (HOSPITAL_BASED_OUTPATIENT_CLINIC_OR_DEPARTMENT_OTHER): Payer: Medicare Other

## 2023-07-26 ENCOUNTER — Encounter (HOSPITAL_BASED_OUTPATIENT_CLINIC_OR_DEPARTMENT_OTHER): Payer: Self-pay

## 2023-07-26 ENCOUNTER — Emergency Department (HOSPITAL_BASED_OUTPATIENT_CLINIC_OR_DEPARTMENT_OTHER)
Admission: EM | Admit: 2023-07-26 | Discharge: 2023-07-26 | Disposition: A | Discharge status: Home / Self Care | Payer: Medicare Other | Attending: Emergency Medicine | Admitting: Emergency Medicine

## 2023-07-26 DIAGNOSIS — I2510 Atherosclerotic heart disease of native coronary artery without angina pectoris: Secondary | ICD-10-CM | POA: Insufficient documentation

## 2023-07-26 DIAGNOSIS — I110 Hypertensive heart disease with heart failure: Secondary | ICD-10-CM | POA: Insufficient documentation

## 2023-07-26 DIAGNOSIS — Z20822 Contact with and (suspected) exposure to covid-19: Secondary | ICD-10-CM | POA: Diagnosis not present

## 2023-07-26 DIAGNOSIS — Z79899 Other long term (current) drug therapy: Secondary | ICD-10-CM | POA: Diagnosis not present

## 2023-07-26 DIAGNOSIS — I509 Heart failure, unspecified: Secondary | ICD-10-CM | POA: Insufficient documentation

## 2023-07-26 DIAGNOSIS — J101 Influenza due to other identified influenza virus with other respiratory manifestations: Secondary | ICD-10-CM | POA: Insufficient documentation

## 2023-07-26 DIAGNOSIS — Z7982 Long term (current) use of aspirin: Secondary | ICD-10-CM | POA: Insufficient documentation

## 2023-07-26 DIAGNOSIS — Z7902 Long term (current) use of antithrombotics/antiplatelets: Secondary | ICD-10-CM | POA: Insufficient documentation

## 2023-07-26 DIAGNOSIS — R059 Cough, unspecified: Secondary | ICD-10-CM | POA: Diagnosis present

## 2023-07-26 LAB — BASIC METABOLIC PANEL
Anion gap: 8 (ref 5–15)
BUN: 11 mg/dL (ref 8–23)
CO2: 21 mmol/L — ABNORMAL LOW (ref 22–32)
Calcium: 9.6 mg/dL (ref 8.9–10.3)
Chloride: 106 mmol/L (ref 98–111)
Creatinine, Ser: 0.7 mg/dL (ref 0.44–1.00)
GFR, Estimated: >60 mL/min (ref >60)
Glucose, Bld: 103 mg/dL — ABNORMAL HIGH (ref 70–99)
Potassium: 3.5 mmol/L (ref 3.5–5.1)
Sodium: 135 mmol/L (ref 135–145)

## 2023-07-26 LAB — RESP PANEL BY RT-PCR (RSV, FLU A&B, COVID)  RVPGX2
Influenza A by PCR: POSITIVE — AB
Influenza B by PCR: NEGATIVE
Resp Syncytial Virus by PCR: NEGATIVE
SARS Coronavirus 2 by RT PCR: NEGATIVE

## 2023-07-26 LAB — CBC
HCT: 34.7 % — ABNORMAL LOW (ref 36.0–46.0)
Hemoglobin: 11.8 g/dL — ABNORMAL LOW (ref 12.0–15.0)
MCH: 28.6 pg (ref 26.0–34.0)
MCHC: 34 g/dL (ref 30.0–36.0)
MCV: 84 fL (ref 80.0–100.0)
Platelets: 240 10*3/uL (ref 150–400)
RBC: 4.13 MIL/uL (ref 3.87–5.11)
RDW: 13.5 % (ref 11.5–15.5)
WBC: 6.7 10*3/uL (ref 4.0–10.5)
nRBC: 0 % (ref 0.0–0.2)

## 2023-07-26 LAB — TROPONIN I (HIGH SENSITIVITY): Troponin I (High Sensitivity): 7 ng/L (ref <18)

## 2023-07-26 MED ORDER — BENZONATATE 100 MG PO CAPS
100.0000 mg | ORAL_CAPSULE | Freq: Once | ORAL | Status: AC
  Administered 2023-07-26: 100 mg via ORAL
  Filled 2023-07-26: qty 1

## 2023-07-26 MED ORDER — ACETAMINOPHEN 325 MG PO TABS
650.0000 mg | ORAL_TABLET | Freq: Once | ORAL | Status: AC
  Administered 2023-07-26: 650 mg via ORAL
  Filled 2023-07-26: qty 2

## 2023-07-26 MED ORDER — OSELTAMIVIR PHOSPHATE 75 MG PO CAPS: 75.0000 mg | ORAL_CAPSULE | Freq: Two times a day (BID) | ORAL | 0 refills | Status: AC

## 2023-07-26 MED ORDER — BENZONATATE 100 MG PO CAPS: 100.0000 mg | ORAL_CAPSULE | Freq: Three times a day (TID) | ORAL | 0 refills | Status: AC

## 2023-07-26 MED ORDER — OSELTAMIVIR PHOSPHATE 75 MG PO CAPS
75.0000 mg | ORAL_CAPSULE | Freq: Once | ORAL | Status: AC
  Administered 2023-07-26: 75 mg via ORAL
  Filled 2023-07-26: qty 1

## 2023-07-26 NOTE — Discharge Instructions (Signed)
 Please follow-up with your primary care provider in regards recent and ER visit.  Today tested positive for the flu which would explain your symptoms and given 1 round of antivirals along with Tessalon  but can pick up the rest of your pharmacy.  Please drink plenty of fluids and eat food as tolerated.  If symptoms change or worsen please return to the ER.

## 2023-07-26 NOTE — ED Provider Notes (Signed)
 [facility] Provider Note   CSN: [id number] Arrival date & time: 07/26/23  1853     History  Chief Complaint  Patient presents with   Cough    [name] [name] is a 78 y.o. female history of CHF, CAD, hypertension presented for cough congestion and chest tenderness from coughing that began yesterday.  Patient is unsure of sick contacts but states that she has felt febrile and mildly short of breath.  Patient's been able to eat and drink that issue and states she has been able to exert herself without issue as well..  Patient denies any chest pain outside of the coughing.  Patient denies hemoptysis.  Patient has had nonproductive cough.   Home Medications Prior to Admission medications   Medication Sig Start Date End Date Taking? Authorizing Provider  benzonatate  (TESSALON ) 100 MG capsule Take 1 capsule (100 mg total) by mouth every 8 (eight) hours. 07/26/23  Yes Barron Vanloan, Lynwood DASEN, PA-C  oseltamivir  (TAMIFLU ) 75 MG capsule Take 1 capsule (75 mg total) by mouth every 12 (twelve) hours. 07/26/23  Yes Wanita Derenzo, Lynwood DASEN, PA-C  acetaminophen  (TYLENOL ) 500 MG tablet Take 500-650 mg by mouth every 6 (six) hours as needed for headache or moderate pain.    [provider]  AIMOVIG  70 MG/ML SOAJ Inject 70 mg into the skin every 30 (thirty) days. 10/15/18   [provider]  albuterol  (VENTOLIN  HFA) 108 (90 Base) MCG/ACT inhaler Inhale 2 puffs into the lungs every 6 (six) hours as needed for wheezing or shortness of breath. 11/15/22   Hunsucker, Donnice SAUNDERS, MD  amLODipine  (NORVASC ) 2.5 MG tablet Take 1 tablet (2.5 mg total) by mouth daily. 04/21/23 07/20/23  Loistine Sober, NP  aspirin  EC 81 MG tablet Take 81 mg by mouth daily.    [provider]  carvedilol  (COREG ) 25 MG tablet Take by mouth. 12/08/22   [provider]  clopidogrel  (PLAVIX ) 75 MG tablet Take 1 tablet (75 mg total) by mouth daily. 11/24/22   Emelia Josefa HERO, NP   Evolocumab  (REPATHA  SURECLICK) 140 MG/ML SOAJ Inject 140 mg into the skin every 14 (fourteen) days. 04/24/23   Court Dorn PARAS, MD  fluticasone  (FLONASE ) 50 MCG/ACT nasal spray Place 1-2 sprays into both nostrils daily as needed for allergies.    [provider]  fluticasone -salmeterol (ADVAIR) 250-50 MCG/ACT AEPB Inhale 1 puff into the lungs in the morning and at bedtime. 11/15/22   Hunsucker, Donnice SAUNDERS, MD  furosemide  (LASIX ) 40 MG tablet Take 1 tablet (40 mg total) by mouth every other day. 11/24/22   Emelia Josefa HERO, NP  isosorbide  mononitrate (IMDUR ) 60 MG 24 hr tablet Take 1 tablet (60 mg total) by mouth daily. 06/08/23 09/06/23  Daneen Damien BROCKS, NP  levocetirizine (XYZAL ) 5 MG tablet Take 5 mg by mouth every evening.    [provider]  montelukast (SINGULAIR) 10 MG tablet Take 10 mg by mouth daily. 10/28/22   [provider]  nitroGLYCERIN  (NITROSTAT ) 0.4 MG SL tablet Place 1 tablet (0.4 mg total) under the tongue every 5 (five) minutes x 3 doses as needed. 11/24/22   Emelia Josefa HERO, NP  nortriptyline  (PAMELOR ) 25 MG capsule Take 25 mg by mouth 2 (two) times daily.    [provider]  pantoprazole  (PROTONIX ) 40 MG tablet Take 1 tablet (40 mg total) by mouth daily. 05/15/18   Henry Manuelita NOVAK, NP  potassium chloride  SA (KLOR-CON  M) 20 MEQ tablet TAKE 1  TABLET BY MOUTH ONCE DAILY. 12/20/22   Emelia Josefa HERO, NP  Propylene Glycol (SYSTANE COMPLETE OP) Place 1 drop into both eyes daily as needed (dry eyes).    [provider]  REPATHA  SURECLICK 140 MG/ML SOAJ INJECT 140 MG INTO THE SKIN EVERY 14 DAYS. Patient taking differently: Inject 140 mg into the skin every 14 (fourteen) days. 10/21/21   Court Dorn PARAS, MD  sertraline  (ZOLOFT ) 100 MG tablet Take 100 mg by mouth daily. 10/19/21   [provider]  Vitamin D, Ergocalciferol, (DRISDOL) 1.25 MG (50000 UNIT) CAPS capsule Take 50,000 Units by mouth once a week. 07/07/21   [provider]       Allergies    Codeine, Garlic, Morphine  and codeine, Atorvastatin , and Pravastatin sodium    Review of Systems   Review of Systems  Respiratory:  Positive for cough.     Physical Exam Updated Vital Signs BP (!) 130/99   Pulse 71   Temp 98.9 F (37.2 C) (Oral)   Resp 20   Wt 81.6 kg   SpO2 94%   BMI 34.46 kg/m  Physical Exam Vitals reviewed.  Constitutional:      General: She is not in acute distress. HENT:     Head: Normocephalic and atraumatic.  Eyes:     Extraocular Movements: Extraocular movements intact.     Conjunctiva/sclera: Conjunctivae normal.     Pupils: Pupils are equal, round, and reactive to light.  Cardiovascular:     Rate and Rhythm: Normal rate and regular rhythm.     Pulses: Normal pulses.     Heart sounds: Normal heart sounds.     Comments: 2+ bilateral radial/dorsalis pedis pulses with regular rate Pulmonary:     Effort: Pulmonary effort is normal. No respiratory distress.     Breath sounds: Normal breath sounds.     Comments: Nonproductive cough Abdominal:     Palpations: Abdomen is soft.     Tenderness: There is no abdominal tenderness. There is no guarding or rebound.  Musculoskeletal:        General: Normal range of motion.     Cervical back: Normal range of motion and neck supple.     Comments: 5 out of 5 bilateral grip/leg extension strength  Skin:    General: Skin is warm and dry.     Capillary Refill: Capillary refill takes less than 2 seconds.  Neurological:     General: No focal deficit present.     Mental Status: She is alert and oriented to person, place, and time.     Comments: Sensation intact in all 4 limbs  Psychiatric:        Mood and Affect: Mood normal.     ED Results / Procedures / Treatments   Labs (all labs ordered are listed, but only abnormal results are displayed) Labs Reviewed  RESP PANEL BY RT-PCR (RSV, FLU A&B, COVID)  RVPGX2 - Abnormal; Notable for the following components:      Result Value    Influenza A by PCR POSITIVE (*)    All other components within normal limits  BASIC METABOLIC PANEL - Abnormal; Notable for the following components:   CO2 21 (*)    Glucose, Bld 103 (*)    All other components within normal limits  CBC - Abnormal; Notable for the following components:   Hemoglobin 11.8 (*)    HCT 34.7 (*)    All other components within normal limits  TROPONIN I (HIGH SENSITIVITY)  EKG EKG Interpretation Date/Time:  Wednesday July 26 2023 19:17:29 EST Ventricular Rate:  79 PR Interval:  165 QRS Duration:  86 QT Interval:  318 QTC Calculation: 365 R Axis:   -18  Text Interpretation: Sinus rhythm LVH with secondary repolarization abnormality similar to Nov 2024 Confirmed by Freddi Hamilton [phone]) on 07/26/2023 7:31:08 PM  Radiology DG Chest 2 View Result Date: 07/26/2023 CLINICAL DATA:  Cough and shortness of breath EXAM: CHEST - 2 VIEW COMPARISON:  01/11/2023 FINDINGS: Cardiac shadow is within normal limits. Lungs are well aerated bilaterally. Mild pleuroparenchymal scarring is seen. Postsurgical changes in the lumbar spine are noted. IMPRESSION: No active cardiopulmonary disease. Electronically Signed   By: Oneil Devonshire M.D.   On: 07/26/2023 20:52    Procedures Procedures    Medications Ordered in ED Medications  acetaminophen  (TYLENOL ) tablet 650 mg (650 mg Oral Given 07/26/23 1923)  oseltamivir  (TAMIFLU ) capsule 75 mg (75 mg Oral Given 07/26/23 2213)  benzonatate  (TESSALON ) capsule 100 mg (100 mg Oral Given 07/26/23 2213)    ED Course/ Medical Decision Making/ A&P                                 Medical Decision Making Amount and/or Complexity of Data Reviewed Labs: ordered. Radiology: ordered.  Risk OTC drugs. Prescription drug management.   Romero SHAUNNA Banter 78 y.o. presented today for URI like symptoms. Working DDx that I considered at this time includes, but not limited to, viral illness, pharyngitis, mono, sinusitis, electrolyte abnormality,  AOM, pneumonia.  R/o DDx: pharyngitis, mono, sinusitis, electrolyte abnormality, AOM, pneumonia: these diagnoses are not consistent with patient's history, presentation, physical exam, labs/imaging findings.  Review of prior external notes: 04/21/2023 office visit  Unique Tests and My Interpretation:  Respiratory Panel: Influenza A positive Troponin: 7 BMP: Unremarkable CBC: Unremarkable Chest x-ray: Unremarkable EKG: Sinus 79 bpm, no ST elevations or depressions noted  Social Determinants of Health: none  Discussion with Independent Historian:  Daughter  Discussion of Management of Tests: None  Risk: Medium: prescription drug management  Risk Stratification Score: None  Plan: On exam patient was no acute distress stable vitals.  Patient's exam was ultimately unremarkable.  Patient did test positive for influenza A and is within the Tamiflu  window we will give Tamiflu  after discussion with the patient.  Patient's chest pain is only when she coughs and has been over a day and with alternative diagnosis of influenza A do not feel patient needs second troponin.  The rest of patient's labs are also reassuring.  Patient is not requiring any oxygen and has not required any oxygen at any point during the ED stay.  Encourage patient use Tylenol  every 6 hours needed for pain follow-up with her primary care provider and to eat food as tolerated drink plenty of fluids.  Patient was given return precautions.patient stable for discharge at this time.  Patient verbalized understanding of plan.  This chart was dictated using voice recognition software.  Despite best efforts to proofread,  errors can occur which can change the documentation meaning.         Final Clinical Impression(s) / ED Diagnoses Final diagnoses:  Influenza A    Rx / DC Orders ED Discharge Orders          Ordered    oseltamivir  (TAMIFLU ) 75 MG capsule  Every 12 hours        07/26/23 2204    benzonatate   (  TESSALON ) 100 MG capsule  Every 8 hours        07/26/23 2204              Victor Lynwood ONEIDA DEVONNA 07/26/23 2226    Freddi Hamilton, MD 07/26/23 [phone]

## 2023-07-26 NOTE — ED Triage Notes (Signed)
 Pt arrives with c/o cough, congestion, and chest soreness when coughing that started yesterday. Pt reports fevers and SOB. Pt denies n/v.

## 2023-08-29 ENCOUNTER — Other Ambulatory Visit: Payer: Self-pay | Admitting: Pharmacist Clinician (PhC)/ Clinical Pharmacy Specialist

## 2023-08-29 ENCOUNTER — Ambulatory Visit: Payer: Medicare Other | Attending: Cardiovascular Disease | Admitting: Cardiovascular Disease

## 2023-08-29 ENCOUNTER — Encounter: Payer: Self-pay | Admitting: Cardiovascular Disease

## 2023-08-29 VITALS — BP 134/80 | HR 72 | Ht 65.0 in | Wt 177.0 lb

## 2023-08-29 DIAGNOSIS — I252 Old myocardial infarction: Secondary | ICD-10-CM

## 2023-08-29 DIAGNOSIS — E785 Hyperlipidemia, unspecified: Secondary | ICD-10-CM

## 2023-08-29 DIAGNOSIS — I10 Essential (primary) hypertension: Secondary | ICD-10-CM

## 2023-08-29 DIAGNOSIS — Z01818 Encounter for other preprocedural examination: Secondary | ICD-10-CM | POA: Insufficient documentation

## 2023-08-29 MED ORDER — REPATHA SURECLICK 140 MG/ML ~~LOC~~ SOAJ: 140.0000 mg | SUBCUTANEOUS | 3 refills | Status: AC

## 2023-08-29 NOTE — Telephone Encounter (Signed)
[facility]  ID     [id number] BIN   [id number] PCN  [id number] GRP  [id number]  Expires 07/28/2024

## 2023-08-29 NOTE — Assessment & Plan Note (Signed)
History of essential hypertension her blood pressure measured today at 134/80.  She is on carvedilol and amlodipine.

## 2023-08-29 NOTE — Patient Instructions (Signed)
Medication Instructions:  Your physician recommends that you continue on your current medications as directed. Please refer to the Current Medication list given to you today.  *If you need a refill on your cardiac medications before your next appointment, please call your pharmacy*   Follow-Up: At [facility], you and your health needs are our priority.  As part of our continuing mission to provide you with exceptional heart care, we have created designated Provider Care Teams.  These Care Teams include your primary Cardiologist (physician) and Advanced Practice Providers (APPs -  Physician Assistants and Nurse Practitioners) who all work together to provide you with the care you need, when you need it.  We recommend signing up for the patient portal called "MyChart".  Sign up information is provided on this After Visit Summary.  MyChart is used to connect with patients for Virtual Visits (Telemedicine).  Patients are able to view lab/test results, encounter notes, upcoming appointments, etc.  Non-urgent messages can be sent to your provider as well.   To learn more about what you can do with MyChart, go to [email or web address].    Your next appointment:   6 month(s)  Provider:   Bernadene Person, NP       Then, [name], MD will plan to see you again in 12 month(s).    Other Instructions

## 2023-08-29 NOTE — Assessment & Plan Note (Signed)
History of CAD status post non-STEMI March 2018 with LAD intervention by Dr. Sharyn Lull.  Because of recurrence of symptoms I catheter 05/06/2018 revealing "in-stent restenosis which I restented with an excellent result.  I did GL her first diagonal branch which was small.  She had 50% mid LAD lesion beyond this which I did not think was significant.  Because of progressive dyspnea I recath her 04/25/2019 revealing a patent LAD stent, 40% stenosis beyond the stented segment and 90% ostial diagonal branch stenosis.  I performed PCI of the ostial diagonal branch through the struts with a 2.25 mm balloon.  Her symptoms of dyspnea resolved.  I did a right left heart cath on her 01/20/2022 revealing a patent LAD stent, 60% stenosis beyond the LAD which was negative by DFR and 95% ostial first diagonal branch stenosis which I read intervened on.  She saw Anice Paganini recently with ongoing symptoms of dyspnea.  A 2D echocardiogram performed 03/14/2023 revealed normal LV systolic function, grade 1 diastolic dysfunction and normal valvular function.  A cardiac PET study performed 06/14/2023 was completely normal.  She was begun on Imdur which was uptitrated by Anice Paganini resulting in improvement in her dyspnea although she is mildly still symptomatic.

## 2023-08-29 NOTE — Assessment & Plan Note (Signed)
History of dyslipidemia on Repatha in the past which unfortunately she has been out of for the last year.  This will be refilled.  Her last lipid profile on file with [place] 01/21/2022 revealed total cholesterol 133, LDL 54 and HDL 42.

## 2023-08-29 NOTE — Assessment & Plan Note (Signed)
Patient apparently needs a total knee replacement.  Given the fact that she has a recent normal echo and cardiac PET study she is cleared at low risk for her procedure.  She can interrupt her antiplatelet therapy.

## 2023-08-29 NOTE — Progress Notes (Signed)
08/29/2023 [name] [name]   May 23, 1946  [id number]  Primary Physician Tracey Harries, MD Primary Cardiologist: Runell Gess MD FACP, [place], [place], [place]  HPI:  [name] [name] is a 78 y.o.  mildly overweight married Caucasian female mother of 3, grandmother of 11 children who I I last saw her in the office 05/03/2022.  Unfortunately, her husband of 54 years died of a massive pulmonary embolism in March of this year.  He was somewhat immobile but had received his Covid vaccine 2 weeks prior to that.  She currently lives alone.  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  She is never smoked.  Her mother did have a myocardial infarction.  She has hypertension hyperlipidemia.  She had a non-STEMI March 2018 and LAD intervention but Dr. Sharyn Lull.  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  She did have a jailed first diagonal branch which was small and a 50% mid LAD beyond the stented segment which I did not think was significant.     Because of progressive dyspnea on exertion and substernal chest pain which I developed a week or so prior to her most recent office visit on 04/23/2019  I was concerned that she may have aggressive in-stent restenosis with her previously placed stents.  I performed diagnostic coronary angiography on her 04/25/2019 revealing a patent LAD stented segment, 40% disease beyond the LAD stent and 90% ostial diagonal branch stenosis.  I performed PCI using a 2.25 mm balloon of the diagonal branch through the stent struts resulting reduction of a 90% ostial first diagonal branch stenosis to less than 30%.  She was discharged home the following day.  Her symptoms have completely resolved.   I performed right and left heart cath by myself 01/20/2022 revealing a patent LAD stent, 60% stenosis in the LAD just beyond the stented segment with negative DFR and 95% ostial first diagonal branch stenosis which  I read to be done through the stent struts as I had 3 years ago her major complaints are fatigue.    Since I saw her in the office a year and a half ago she did recently see Anice Paganini, NP in the office with complaints of dyspnea.  A 2D echo performed 03/14/2023 showed normal LV systolic function with grade 1 diastolic dysfunction and no evidence of valvular abnormalities.  RV function was normal.  Cardiac PET study performed 06/14/2023 was essentially normal without evidence of ischemia.  She was begun on Imdur empirically which resulted in some improvement in her dyspnea however.  Current Meds  Medication Sig   acetaminophen (TYLENOL) 500 MG tablet Take 500-650 mg by mouth every 6 (six) hours as needed for headache or moderate pain.   AIMOVIG 70 MG/ML SOAJ Inject 70 mg into the skin every 30 (thirty) days.   albuterol (VENTOLIN HFA) 108 (90 Base) MCG/ACT inhaler Inhale 2 puffs into the lungs every 6 (six) hours as needed for wheezing or shortness of breath.   aspirin EC 81 MG tablet Take 81 mg by mouth daily.   carvedilol (COREG) 25 MG tablet Take by mouth.   clopidogrel (PLAVIX) 75 MG tablet Take 1 tablet (75 mg total) by mouth daily.   fluticasone (FLONASE) 50 MCG/ACT nasal spray Place 1-2 sprays into both nostrils daily as needed for allergies.   fluticasone-salmeterol (ADVAIR) 250-50 MCG/ACT AEPB Inhale 1 puff into the lungs in the morning and at bedtime.  furosemide (LASIX) 40 MG tablet Take 1 tablet (40 mg total) by mouth every other day.   isosorbide mononitrate (IMDUR) 60 MG 24 hr tablet Take 1 tablet (60 mg total) by mouth daily.   levocetirizine (XYZAL) 5 MG tablet Take 5 mg by mouth every evening.   montelukast (SINGULAIR) 10 MG tablet Take 10 mg by mouth daily.   nitroGLYCERIN (NITROSTAT) 0.4 MG SL tablet Place 1 tablet (0.4 mg total) under the tongue every 5 (five) minutes x 3 doses as needed.   nortriptyline (PAMELOR) 25 MG capsule Take 25 mg by mouth 2 (two) times daily.    pantoprazole (PROTONIX) 40 MG tablet Take 1 tablet (40 mg total) by mouth daily.   potassium chloride SA (KLOR-CON M) 20 MEQ tablet TAKE 1 TABLET BY MOUTH ONCE DAILY.   Propylene Glycol (SYSTANE COMPLETE OP) Place 1 drop into both eyes daily as needed (dry eyes).   REPATHA SURECLICK 140 MG/ML SOAJ INJECT 140 MG INTO THE SKIN EVERY 14 DAYS. (Patient taking differently: Inject 140 mg into the skin every 14 (fourteen) days.)   sertraline (ZOLOFT) 100 MG tablet Take 100 mg by mouth daily.   Vitamin D, Ergocalciferol, (DRISDOL) 1.25 MG (50000 UNIT) CAPS capsule Take 50,000 Units by mouth once a week.     Allergies  Allergen Reactions   Codeine Anaphylaxis   Garlic Anaphylaxis   Morphine And Codeine Nausea And Vomiting   Atorvastatin Other (See Comments)    Muscle aches   Pravastatin Sodium Other (See Comments)    Muscle pain    Social History   Socioeconomic History   Marital status: Widowed    Spouse name: Not on file   Number of children: Not on file   Years of education: Not on file   Highest education level: Not on file  Occupational History   Not on file  Tobacco Use   Smoking status: Never   Smokeless tobacco: Never  Vaping Use   Vaping status: Never Used  Substance and Sexual Activity   Alcohol use: No   Drug use: No   Sexual activity: Not on file  Other Topics Concern   Not on file  Social History Narrative   Not on file   Social Drivers of Health   Financial Resource Strain: Low Risk  (04/19/2023)   Received from [facility]   Overall Financial Resource Strain (CARDIA)    Difficulty of Paying Living Expenses: Not hard at all  Food Insecurity: No Food Insecurity (04/19/2023)   Received from [facility]   Hunger Vital Sign    Worried About Running Out of Food in the Last Year: Never true    Ran Out of Food in the Last Year: Never true  Transportation Needs: No Transportation Needs (04/19/2023)   Received from [facility] - Transportation    Lack  of Transportation (Medical): No    Lack of Transportation (Non-Medical): No  Physical Activity: Inactive (04/19/2023)   Received from [facility]   Exercise Vital Sign    Days of Exercise per Week: 0 days    Minutes of Exercise per Session: 0 min  Stress: No Stress Concern Present (04/19/2023)   Received from [facility] - Occupational Stress Questionnaire    Feeling of Stress : Not at all  Social Connections: Unknown (04/19/2023)   Received from [facility]   Social Connection and Isolation Panel [NHANES]    Frequency of Communication with Friends and  Family: Not on file    Frequency of Social Gatherings with Friends and Family: Not on file    Attends Religious Services: Not on file    Active Member of Clubs or Organizations: Not on file    Attends Club or Organization Meetings: Not on file    Marital Status: Widowed  Intimate Partner Violence: Not At Risk (04/19/2023)   Received from [facility]   Humiliation, Afraid, Rape, and Kick questionnaire    Fear of Current or Ex-Partner: No    Emotionally Abused: No    Physically Abused: No    Sexually Abused: No     Review of Systems: General: negative for chills, fever, night sweats or weight changes.  Cardiovascular: negative for chest pain, dyspnea on exertion, edema, orthopnea, palpitations, paroxysmal nocturnal dyspnea or shortness of breath Dermatological: negative for rash Respiratory: negative for cough or wheezing Urologic: negative for hematuria Abdominal: negative for nausea, vomiting, diarrhea, bright red blood per rectum, melena, or hematemesis Neurologic: negative for visual changes, syncope, or dizziness All other systems reviewed and are otherwise negative except as noted above.    Blood pressure 134/80, pulse 72, height 5\' 5"  (1.651 m), weight 177 lb (80.3 kg), SpO2 98%.  General appearance: alert and no distress Neck: no adenopathy, no carotid bruit, no JVD,  supple, symmetrical, trachea midline, and thyroid not enlarged, symmetric, no tenderness/mass/nodules Lungs: clear to auscultation bilaterally Heart: regular rate and rhythm, S1, S2 normal, no murmur, click, rub or gallop Extremities: extremities normal, atraumatic, no cyanosis or edema Pulses: 2+ and symmetric Skin: Skin color, texture, turgor normal. No rashes or lesions Neurologic: Grossly normal  EKG not performed today      ASSESSMENT AND PLAN:   Essential hypertension History of essential hypertension her blood pressure measured today at 134/80.  She is on carvedilol and amlodipine.  History of NSTEMI History of CAD status post non-STEMI March 2018 with LAD intervention by Dr. Sharyn Lull.  Because of recurrence of symptoms I catheter 05/06/2018 revealing "in-stent restenosis which I restented with an excellent result.  I did GL her first diagonal branch which was small.  She had 50% mid LAD lesion beyond this which I did not think was significant.  Because of progressive dyspnea I recath her 04/25/2019 revealing a patent LAD stent, 40% stenosis beyond the stented segment and 90% ostial diagonal branch stenosis.  I performed PCI of the ostial diagonal branch through the struts with a 2.25 mm balloon.  Her symptoms of dyspnea resolved.  I did a right left heart cath on her 01/20/2022 revealing a patent LAD stent, 60% stenosis beyond the LAD which was negative by DFR and 95% ostial first diagonal branch stenosis which I read intervened on.  She saw Anice Paganini recently with ongoing symptoms of dyspnea.  A 2D echocardiogram performed 03/14/2023 revealed normal LV systolic function, grade 1 diastolic dysfunction and normal valvular function.  A cardiac PET study performed 06/14/2023 was completely normal.  She was begun on Imdur which was uptitrated by Anice Paganini resulting in improvement in her dyspnea although she is mildly still symptomatic.  Dyslipidemia, goal LDL below 70 History of dyslipidemia on  Repatha in the past which unfortunately she has been out of for the last year.  This will be refilled.  Her last lipid profile on file with [place] 01/21/2022 revealed total cholesterol 133, LDL 54 and HDL 42.     Runell Gess MD FACP,FACC,FAHA, [facility] 08/29/2023 4:48 PM

## 2024-01-05 ENCOUNTER — Other Ambulatory Visit: Payer: Self-pay | Admitting: General Practice

## 2024-01-30 ENCOUNTER — Ambulatory Visit
Admission: RE | Admit: 2024-01-30 | Discharge: 2024-01-30 | Disposition: A | Discharge status: Home / Self Care | Source: Ambulatory Visit | Attending: Nurse Practitioner

## 2024-01-30 DIAGNOSIS — R918 Other nonspecific abnormal finding of lung field: Secondary | ICD-10-CM

## 2024-02-05 ENCOUNTER — Ambulatory Visit: Payer: Self-pay | Admitting: Nurse Practitioner

## 2024-02-05 NOTE — Progress Notes (Signed)
 Lung nodule we have been following has some calcification and considered benign at this point; has been stable since 2023. She has some mild enlargement of the PA and plaque in the arteries. She's followed by cardiology and prior echo without any PH. Follow up with Dr. Annella in 03/2024 for yearly f/u - please schedule. Thanks.

## 2024-06-25 ENCOUNTER — Other Ambulatory Visit: Payer: Self-pay | Admitting: Nurse Practitioner

## 2024-07-05 ENCOUNTER — Telehealth: Payer: Self-pay | Admitting: Cardiovascular Disease

## 2024-07-05 MED ORDER — AMLODIPINE BESYLATE 2.5 MG PO TABS: 2.5000 mg | ORAL_TABLET | Freq: Every day | ORAL | 0 refills | Status: AC

## 2024-07-05 NOTE — Telephone Encounter (Signed)
 Will send to patient's new pharmacy.

## 2024-07-05 NOTE — Telephone Encounter (Signed)
" °*  STAT* If patient is at the pharmacy, call can be transferred to refill team.   1. Which medications need to be refilled? (please list name of each medication and dose if known) amLODipine  (NORVASC ) 2.5 MG tablet   NEW PHARMACY    4. Which pharmacy/location (including street and city if local pharmacy) is medication to be sent to?  [facility] - [place], [place] - [address] Phone: [phone]  Fax: [phone]       5. Do they need a 30 day or 90 day supply? 90   "
